# Patient Record
Sex: Male | Born: 1950
Health system: Southern US, Community
[De-identification: ages and names within clinical notes are randomized; demographics above are authoritative.]

## PROBLEM LIST (undated history)

## (undated) DIAGNOSIS — E785 Hyperlipidemia, unspecified: Secondary | ICD-10-CM

## (undated) DIAGNOSIS — N189 Chronic kidney disease, unspecified: Secondary | ICD-10-CM

## (undated) DIAGNOSIS — I495 Sick sinus syndrome: Secondary | ICD-10-CM

## (undated) DIAGNOSIS — G8929 Other chronic pain: Secondary | ICD-10-CM

## (undated) DIAGNOSIS — C61 Malignant neoplasm of prostate: Secondary | ICD-10-CM

## (undated) DIAGNOSIS — H409 Unspecified glaucoma: Secondary | ICD-10-CM

## (undated) DIAGNOSIS — R739 Hyperglycemia, unspecified: Secondary | ICD-10-CM

## (undated) DIAGNOSIS — E119 Type 2 diabetes mellitus without complications: Secondary | ICD-10-CM

## (undated) DIAGNOSIS — R079 Chest pain, unspecified: Secondary | ICD-10-CM

## (undated) DIAGNOSIS — K219 Gastro-esophageal reflux disease without esophagitis: Secondary | ICD-10-CM

## (undated) DIAGNOSIS — M549 Dorsalgia, unspecified: Secondary | ICD-10-CM

## (undated) DIAGNOSIS — N390 Urinary tract infection, site not specified: Secondary | ICD-10-CM

## (undated) DIAGNOSIS — F1910 Other psychoactive substance abuse, uncomplicated: Secondary | ICD-10-CM

## (undated) DIAGNOSIS — I10 Essential (primary) hypertension: Secondary | ICD-10-CM

## (undated) HISTORY — DX: Type 2 diabetes mellitus without complications: E11.9

## (undated) HISTORY — DX: Malignant neoplasm of prostate: C61

## (undated) HISTORY — DX: Chest pain, unspecified: R07.9

## (undated) HISTORY — DX: Hyperglycemia, unspecified: R73.9

## (undated) HISTORY — DX: Sick sinus syndrome: I49.5

## (undated) HISTORY — PX: CORONARY ANGIOPLASTY WITH STENT PLACEMENT: SHX49

## (undated) HISTORY — PX: PROSTATE BIOPSY: SHX241

## (undated) HISTORY — PX: CHOLECYSTECTOMY: SHX55

## (undated) HISTORY — DX: Unspecified glaucoma: H40.9

## (undated) HISTORY — DX: Other psychoactive substance abuse, uncomplicated: F19.10

---

## 1999-05-09 ENCOUNTER — Emergency Department (HOSPITAL_COMMUNITY): Admission: EM | Admit: 1999-05-09 | Discharge: 1999-05-09 | Payer: Self-pay | Admitting: Emergency Medicine

## 2000-12-04 ENCOUNTER — Inpatient Hospital Stay (HOSPITAL_COMMUNITY): Admission: EM | Admit: 2000-12-04 | Discharge: 2000-12-07 | Payer: Self-pay | Admitting: *Deleted

## 2000-12-04 ENCOUNTER — Encounter (INDEPENDENT_AMBULATORY_CARE_PROVIDER_SITE_OTHER): Payer: Self-pay | Admitting: Specialist

## 2000-12-05 ENCOUNTER — Encounter: Payer: Self-pay | Admitting: *Deleted

## 2006-03-27 ENCOUNTER — Encounter: Payer: Self-pay | Admitting: Emergency Medicine

## 2007-02-02 ENCOUNTER — Observation Stay (HOSPITAL_COMMUNITY): Admission: EM | Admit: 2007-02-02 | Discharge: 2007-02-05 | Payer: Self-pay | Admitting: Emergency Medicine

## 2007-02-03 ENCOUNTER — Encounter (INDEPENDENT_AMBULATORY_CARE_PROVIDER_SITE_OTHER): Payer: Self-pay | Admitting: Cardiology

## 2007-02-11 ENCOUNTER — Ambulatory Visit: Payer: Self-pay | Admitting: *Deleted

## 2007-02-18 ENCOUNTER — Ambulatory Visit: Payer: Self-pay | Admitting: Internal Medicine

## 2007-03-12 ENCOUNTER — Ambulatory Visit: Payer: Self-pay | Admitting: Internal Medicine

## 2007-03-21 ENCOUNTER — Ambulatory Visit: Payer: Self-pay | Admitting: Internal Medicine

## 2007-03-28 ENCOUNTER — Ambulatory Visit: Payer: Self-pay | Admitting: Internal Medicine

## 2007-04-19 ENCOUNTER — Encounter: Payer: Self-pay | Admitting: Internal Medicine

## 2007-04-19 DIAGNOSIS — F17200 Nicotine dependence, unspecified, uncomplicated: Secondary | ICD-10-CM | POA: Insufficient documentation

## 2007-04-19 DIAGNOSIS — I10 Essential (primary) hypertension: Secondary | ICD-10-CM | POA: Insufficient documentation

## 2007-04-19 DIAGNOSIS — F1910 Other psychoactive substance abuse, uncomplicated: Secondary | ICD-10-CM | POA: Insufficient documentation

## 2007-04-19 DIAGNOSIS — E782 Mixed hyperlipidemia: Secondary | ICD-10-CM | POA: Insufficient documentation

## 2007-04-19 HISTORY — DX: Other psychoactive substance abuse, uncomplicated: F19.10

## 2007-04-19 LAB — CONVERTED CEMR LAB
Cholesterol: 229 mg/dL
HDL: 39 mg/dL
LDL Cholesterol: 156 mg/dL
Triglycerides: 172 mg/dL

## 2007-11-28 ENCOUNTER — Encounter (INDEPENDENT_AMBULATORY_CARE_PROVIDER_SITE_OTHER): Payer: Self-pay | Admitting: Internal Medicine

## 2007-12-19 ENCOUNTER — Ambulatory Visit: Payer: Self-pay | Admitting: Internal Medicine

## 2007-12-19 DIAGNOSIS — H409 Unspecified glaucoma: Secondary | ICD-10-CM | POA: Insufficient documentation

## 2007-12-26 ENCOUNTER — Telehealth (INDEPENDENT_AMBULATORY_CARE_PROVIDER_SITE_OTHER): Payer: Self-pay | Admitting: Internal Medicine

## 2008-01-09 ENCOUNTER — Telehealth (INDEPENDENT_AMBULATORY_CARE_PROVIDER_SITE_OTHER): Payer: Self-pay | Admitting: Internal Medicine

## 2008-02-05 ENCOUNTER — Ambulatory Visit: Payer: Self-pay | Admitting: Internal Medicine

## 2008-02-05 LAB — CONVERTED CEMR LAB
Blood in Urine, dipstick: NEGATIVE
Glucose, Urine, Semiquant: NEGATIVE
Nitrite: NEGATIVE
Protein, U semiquant: 100
Specific Gravity, Urine: >=1.03
Urobilinogen, UA: NEGATIVE
WBC Urine, dipstick: NEGATIVE
pH: 5

## 2008-02-12 ENCOUNTER — Ambulatory Visit: Payer: Self-pay | Admitting: Internal Medicine

## 2008-02-12 LAB — CONVERTED CEMR LAB
Blood in Urine, dipstick: NEGATIVE
Glucose, Urine, Semiquant: NEGATIVE
Nitrite: NEGATIVE
Protein, U semiquant: 30
Specific Gravity, Urine: >=1.03
Urobilinogen, UA: 0.2
WBC Urine, dipstick: NEGATIVE
pH: 5

## 2008-02-14 ENCOUNTER — Encounter (INDEPENDENT_AMBULATORY_CARE_PROVIDER_SITE_OTHER): Payer: Self-pay | Admitting: Internal Medicine

## 2008-02-14 LAB — CONVERTED CEMR LAB
ALT: 19 units/L (ref 0–53)
AST: 18 units/L (ref 0–37)
Albumin: 4.4 g/dL (ref 3.5–5.2)
Alkaline Phosphatase: 80 units/L (ref 39–117)
BUN: 10 mg/dL (ref 6–23)
Basophils Absolute: 0.1 10*3/uL (ref 0.0–0.1)
Basophils Relative: 2 % — ABNORMAL HIGH (ref 0–1)
CO2: 24 meq/L (ref 19–32)
Calcium: 9.6 mg/dL (ref 8.4–10.5)
Chloride: 101 meq/L (ref 96–112)
Cholesterol: 217 mg/dL — ABNORMAL HIGH (ref 0–200)
Creatinine, Ser: 1.94 mg/dL — ABNORMAL HIGH (ref 0.40–1.50)
Eosinophils Absolute: 0.2 10*3/uL (ref 0.0–0.7)
Eosinophils Relative: 4 % (ref 0–5)
Glucose, Bld: 117 mg/dL — ABNORMAL HIGH (ref 70–99)
HCT: 45.5 % (ref 39.0–52.0)
HDL: 45 mg/dL (ref >39)
Hemoglobin: 15.3 g/dL (ref 13.0–17.0)
LDL Cholesterol: 130 mg/dL — ABNORMAL HIGH (ref 0–99)
Lymphocytes Relative: 32 % (ref 12–46)
Lymphs Abs: 1.3 10*3/uL (ref 0.7–4.0)
MCHC: 33.6 g/dL (ref 30.0–36.0)
MCV: 87 fL (ref 78.0–100.0)
Monocytes Absolute: 0.4 10*3/uL (ref 0.1–1.0)
Monocytes Relative: 9 % (ref 3–12)
Neutro Abs: 2.1 10*3/uL (ref 1.7–7.7)
Neutrophils Relative %: 53 % (ref 43–77)
PSA: 3.7 ng/mL (ref 0.10–4.00)
Platelets: 254 10*3/uL (ref 150–400)
Potassium: 4.2 meq/L (ref 3.5–5.3)
RBC: 5.23 M/uL (ref 4.22–5.81)
RDW: 13.4 % (ref 11.5–15.5)
Sodium: 137 meq/L (ref 135–145)
Total Bilirubin: 0.7 mg/dL (ref 0.3–1.2)
Total CHOL/HDL Ratio: 4.8
Total Protein: 7.5 g/dL (ref 6.0–8.3)
Triglycerides: 212 mg/dL — ABNORMAL HIGH (ref <150)
VLDL: 42 mg/dL — ABNORMAL HIGH (ref 0–40)
WBC: 4 10*3/uL (ref 4.0–10.5)

## 2008-06-08 ENCOUNTER — Ambulatory Visit: Payer: Self-pay | Admitting: Internal Medicine

## 2008-06-08 DIAGNOSIS — E119 Type 2 diabetes mellitus without complications: Secondary | ICD-10-CM

## 2008-06-08 DIAGNOSIS — R739 Hyperglycemia, unspecified: Secondary | ICD-10-CM | POA: Insufficient documentation

## 2008-06-08 LAB — CONVERTED CEMR LAB: Blood Glucose, Fingerstick: 107

## 2008-06-11 ENCOUNTER — Ambulatory Visit: Payer: Self-pay | Admitting: Internal Medicine

## 2008-06-11 LAB — CONVERTED CEMR LAB: Blood Glucose, AC Bkfst: 104 mg/dL

## 2008-06-24 ENCOUNTER — Encounter (INDEPENDENT_AMBULATORY_CARE_PROVIDER_SITE_OTHER): Payer: Self-pay | Admitting: Internal Medicine

## 2008-07-13 ENCOUNTER — Ambulatory Visit: Payer: Self-pay | Admitting: Internal Medicine

## 2008-07-21 ENCOUNTER — Ambulatory Visit: Payer: Self-pay | Admitting: Internal Medicine

## 2008-07-21 LAB — CONVERTED CEMR LAB
ALT: 26 units/L (ref 0–53)
AST: 20 units/L (ref 0–37)
Albumin: 4.5 g/dL (ref 3.5–5.2)
Alkaline Phosphatase: 75 units/L (ref 39–117)
Bilirubin, Direct: 0.2 mg/dL (ref 0.0–0.3)
Cholesterol: 138 mg/dL (ref 0–200)
HDL: 41 mg/dL (ref >39)
Indirect Bilirubin: 0.7 mg/dL (ref 0.0–0.9)
LDL Cholesterol: 70 mg/dL (ref 0–99)
Total Bilirubin: 0.9 mg/dL (ref 0.3–1.2)
Total CHOL/HDL Ratio: 3.4
Total Protein: 7.4 g/dL (ref 6.0–8.3)
Triglycerides: 135 mg/dL (ref <150)
VLDL: 27 mg/dL (ref 0–40)

## 2008-07-22 ENCOUNTER — Encounter (INDEPENDENT_AMBULATORY_CARE_PROVIDER_SITE_OTHER): Payer: Self-pay | Admitting: Internal Medicine

## 2009-08-04 ENCOUNTER — Ambulatory Visit: Payer: Self-pay | Admitting: Internal Medicine

## 2009-08-10 ENCOUNTER — Ambulatory Visit: Payer: Self-pay | Admitting: Internal Medicine

## 2009-08-22 LAB — CONVERTED CEMR LAB
ALT: 34 units/L (ref 0–53)
AST: 27 units/L (ref 0–37)
Albumin: 4.5 g/dL (ref 3.5–5.2)
Alkaline Phosphatase: 79 units/L (ref 39–117)
BUN: 14 mg/dL (ref 6–23)
Basophils Absolute: 0 10*3/uL (ref 0.0–0.1)
Basophils Relative: 1 % (ref 0–1)
CO2: 23 meq/L (ref 19–32)
Calcium: 9.5 mg/dL (ref 8.4–10.5)
Chloride: 103 meq/L (ref 96–112)
Cholesterol: 165 mg/dL (ref 0–200)
Creatinine, Ser: 1.79 mg/dL — ABNORMAL HIGH (ref 0.40–1.50)
Eosinophils Absolute: 0.1 10*3/uL (ref 0.0–0.7)
Eosinophils Relative: 3 % (ref 0–5)
Glucose, Bld: 102 mg/dL — ABNORMAL HIGH (ref 70–99)
HCT: 47.1 % (ref 39.0–52.0)
HDL: 39 mg/dL — ABNORMAL LOW (ref >39)
Hemoglobin: 15.8 g/dL (ref 13.0–17.0)
LDL Cholesterol: 93 mg/dL (ref 0–99)
Lymphocytes Relative: 35 % (ref 12–46)
Lymphs Abs: 1.4 10*3/uL (ref 0.7–4.0)
MCHC: 33.5 g/dL (ref 30.0–36.0)
MCV: 87.4 fL (ref 78.0–100.0)
Monocytes Absolute: 0.4 10*3/uL (ref 0.1–1.0)
Monocytes Relative: 11 % (ref 3–12)
Neutro Abs: 2 10*3/uL (ref 1.7–7.7)
Neutrophils Relative %: 50 % (ref 43–77)
Platelets: 226 10*3/uL (ref 150–400)
Potassium: 4.3 meq/L (ref 3.5–5.3)
RBC: 5.39 M/uL (ref 4.22–5.81)
RDW: 14.4 % (ref 11.5–15.5)
Sodium: 138 meq/L (ref 135–145)
Total Bilirubin: 1 mg/dL (ref 0.3–1.2)
Total CHOL/HDL Ratio: 4.2
Total Protein: 7.4 g/dL (ref 6.0–8.3)
Triglycerides: 167 mg/dL — ABNORMAL HIGH (ref <150)
VLDL: 33 mg/dL (ref 0–40)
WBC: 4 10*3/uL (ref 4.0–10.5)

## 2009-08-31 ENCOUNTER — Ambulatory Visit: Payer: Self-pay | Admitting: Internal Medicine

## 2009-09-20 ENCOUNTER — Ambulatory Visit: Payer: Self-pay | Admitting: Internal Medicine

## 2009-09-21 ENCOUNTER — Encounter (INDEPENDENT_AMBULATORY_CARE_PROVIDER_SITE_OTHER): Payer: Self-pay | Admitting: Internal Medicine

## 2009-10-03 ENCOUNTER — Telehealth (INDEPENDENT_AMBULATORY_CARE_PROVIDER_SITE_OTHER): Payer: Self-pay | Admitting: Internal Medicine

## 2010-01-19 ENCOUNTER — Ambulatory Visit: Payer: Self-pay | Admitting: Internal Medicine

## 2010-01-19 DIAGNOSIS — K219 Gastro-esophageal reflux disease without esophagitis: Secondary | ICD-10-CM | POA: Insufficient documentation

## 2010-01-28 ENCOUNTER — Encounter (INDEPENDENT_AMBULATORY_CARE_PROVIDER_SITE_OTHER): Payer: Self-pay | Admitting: Internal Medicine

## 2010-01-28 LAB — CONVERTED CEMR LAB
ALT: 22 units/L (ref 0–53)
AST: 19 units/L (ref 0–37)
Albumin: 4.3 g/dL (ref 3.5–5.2)
Alkaline Phosphatase: 89 units/L (ref 39–117)
BUN: 10 mg/dL (ref 6–23)
Basophils Absolute: 0.1 10*3/uL (ref 0.0–0.1)
Basophils Relative: 1 % (ref 0–1)
CO2: 26 meq/L (ref 19–32)
Calcium: 9.3 mg/dL (ref 8.4–10.5)
Chloride: 103 meq/L (ref 96–112)
Cholesterol: 123 mg/dL (ref 0–200)
Creatinine, Ser: 1.81 mg/dL — ABNORMAL HIGH (ref 0.40–1.50)
Eosinophils Absolute: 0.2 10*3/uL (ref 0.0–0.7)
Eosinophils Relative: 5 % (ref 0–5)
Glucose, Bld: 100 mg/dL — ABNORMAL HIGH (ref 70–99)
HCT: 43.9 % (ref 39.0–52.0)
HDL: 41 mg/dL (ref >39)
Hemoglobin: 14.6 g/dL (ref 13.0–17.0)
Hgb A1c MFr Bld: 6.3 % — ABNORMAL HIGH (ref 4.6–6.1)
LDL Cholesterol: 55 mg/dL (ref 0–99)
Lymphocytes Relative: 36 % (ref 12–46)
Lymphs Abs: 1.4 10*3/uL (ref 0.7–4.0)
MCHC: 33.3 g/dL (ref 30.0–36.0)
MCV: 87.8 fL (ref 78.0–100.0)
Monocytes Absolute: 0.5 10*3/uL (ref 0.1–1.0)
Monocytes Relative: 12 % (ref 3–12)
Neutro Abs: 1.8 10*3/uL (ref 1.7–7.7)
Neutrophils Relative %: 46 % (ref 43–77)
Platelets: 230 10*3/uL (ref 150–400)
Potassium: 4.7 meq/L (ref 3.5–5.3)
RBC: 5 M/uL (ref 4.22–5.81)
RDW: 14.2 % (ref 11.5–15.5)
Sodium: 138 meq/L (ref 135–145)
Total Bilirubin: 0.9 mg/dL (ref 0.3–1.2)
Total CHOL/HDL Ratio: 3
Total Protein: 7.1 g/dL (ref 6.0–8.3)
Triglycerides: 134 mg/dL (ref <150)
VLDL: 27 mg/dL (ref 0–40)
WBC: 3.9 10*3/uL — ABNORMAL LOW (ref 4.0–10.5)

## 2010-03-06 ENCOUNTER — Encounter (INDEPENDENT_AMBULATORY_CARE_PROVIDER_SITE_OTHER): Payer: Self-pay | Admitting: Internal Medicine

## 2010-03-21 ENCOUNTER — Ambulatory Visit: Payer: Self-pay | Admitting: Internal Medicine

## 2010-03-21 LAB — CONVERTED CEMR LAB: Blood Glucose, Fingerstick: 134

## 2010-03-22 ENCOUNTER — Ambulatory Visit: Payer: Self-pay | Admitting: Internal Medicine

## 2010-03-24 ENCOUNTER — Encounter: Admission: RE | Admit: 2010-03-24 | Discharge: 2010-03-24 | Payer: Self-pay | Admitting: Internal Medicine

## 2010-04-10 ENCOUNTER — Encounter (INDEPENDENT_AMBULATORY_CARE_PROVIDER_SITE_OTHER): Payer: Self-pay | Admitting: Internal Medicine

## 2010-04-14 ENCOUNTER — Encounter (INDEPENDENT_AMBULATORY_CARE_PROVIDER_SITE_OTHER): Payer: Self-pay | Admitting: Internal Medicine

## 2010-05-17 ENCOUNTER — Ambulatory Visit: Payer: Self-pay | Admitting: Internal Medicine

## 2010-05-17 ENCOUNTER — Telehealth (INDEPENDENT_AMBULATORY_CARE_PROVIDER_SITE_OTHER): Payer: Self-pay | Admitting: Internal Medicine

## 2010-05-17 DIAGNOSIS — I495 Sick sinus syndrome: Secondary | ICD-10-CM | POA: Insufficient documentation

## 2010-05-17 DIAGNOSIS — R079 Chest pain, unspecified: Secondary | ICD-10-CM | POA: Insufficient documentation

## 2010-05-17 DIAGNOSIS — I2510 Atherosclerotic heart disease of native coronary artery without angina pectoris: Secondary | ICD-10-CM | POA: Insufficient documentation

## 2010-05-17 HISTORY — DX: Sick sinus syndrome: I49.5

## 2010-05-23 ENCOUNTER — Telehealth (INDEPENDENT_AMBULATORY_CARE_PROVIDER_SITE_OTHER): Payer: Self-pay | Admitting: Internal Medicine

## 2010-05-23 ENCOUNTER — Encounter: Payer: Self-pay | Admitting: Cardiovascular Disease

## 2010-05-23 ENCOUNTER — Ambulatory Visit (HOSPITAL_COMMUNITY): Admission: RE | Admit: 2010-05-23 | Discharge: 2010-05-23 | Payer: Self-pay | Admitting: Internal Medicine

## 2010-05-25 ENCOUNTER — Encounter (INDEPENDENT_AMBULATORY_CARE_PROVIDER_SITE_OTHER): Payer: Self-pay | Admitting: Internal Medicine

## 2010-06-22 ENCOUNTER — Ambulatory Visit: Payer: Self-pay | Admitting: Cardiovascular Disease

## 2010-06-27 ENCOUNTER — Telehealth (INDEPENDENT_AMBULATORY_CARE_PROVIDER_SITE_OTHER): Payer: Self-pay | Admitting: *Deleted

## 2010-06-28 ENCOUNTER — Ambulatory Visit: Payer: Self-pay

## 2010-06-28 ENCOUNTER — Encounter (HOSPITAL_COMMUNITY): Admission: RE | Admit: 2010-06-28 | Discharge: 2010-08-15 | Payer: Self-pay | Admitting: Cardiovascular Disease

## 2010-06-28 ENCOUNTER — Encounter: Payer: Self-pay | Admitting: Cardiovascular Disease

## 2010-06-28 ENCOUNTER — Ambulatory Visit: Payer: Self-pay | Admitting: Cardiovascular Disease

## 2010-09-15 ENCOUNTER — Ambulatory Visit: Payer: Self-pay | Admitting: Internal Medicine

## 2010-09-15 ENCOUNTER — Telehealth (INDEPENDENT_AMBULATORY_CARE_PROVIDER_SITE_OTHER): Payer: Self-pay | Admitting: Internal Medicine

## 2010-09-15 LAB — CONVERTED CEMR LAB
ALT: 25 units/L (ref 0–53)
AST: 20 units/L (ref 0–37)
Albumin: 4.2 g/dL (ref 3.5–5.2)
Alkaline Phosphatase: 80 units/L (ref 39–117)
BUN: 14 mg/dL (ref 6–23)
Basophils Absolute: 0.1 10*3/uL (ref 0.0–0.1)
Basophils Relative: 1 % (ref 0–1)
Bilirubin Urine: NEGATIVE
CO2: 22 meq/L (ref 19–32)
Calcium: 9.5 mg/dL (ref 8.4–10.5)
Chlamydia, Swab/Urine, PCR: NEGATIVE
Chloride: 104 meq/L (ref 96–112)
Cholesterol: 115 mg/dL (ref 0–200)
Creatinine, Ser: 1.81 mg/dL — ABNORMAL HIGH (ref 0.40–1.50)
Eosinophils Absolute: 0.1 10*3/uL (ref 0.0–0.7)
Eosinophils Relative: 3 % (ref 0–5)
GC Probe Amp, Urine: NEGATIVE
Glucose, Bld: 94 mg/dL (ref 70–99)
Glucose, Urine, Semiquant: NEGATIVE
HCT: 44 % (ref 39.0–52.0)
HDL: 30 mg/dL — ABNORMAL LOW (ref >39)
Hemoglobin: 14.5 g/dL (ref 13.0–17.0)
LDL Cholesterol: 50 mg/dL (ref 0–99)
Lymphocytes Relative: 35 % (ref 12–46)
Lymphs Abs: 1.4 10*3/uL (ref 0.7–4.0)
MCHC: 33 g/dL (ref 30.0–36.0)
MCV: 87.8 fL (ref 78.0–100.0)
Monocytes Absolute: 0.5 10*3/uL (ref 0.1–1.0)
Monocytes Relative: 13 % — ABNORMAL HIGH (ref 3–12)
Neutro Abs: 1.8 10*3/uL (ref 1.7–7.7)
Neutrophils Relative %: 47 % (ref 43–77)
Nitrite: NEGATIVE
Platelets: 248 10*3/uL (ref 150–400)
Potassium: 4.7 meq/L (ref 3.5–5.3)
Protein, U semiquant: NEGATIVE
RBC: 5.01 M/uL (ref 4.22–5.81)
RDW: 14 % (ref 11.5–15.5)
Sodium: 137 meq/L (ref 135–145)
Specific Gravity, Urine: >=1.03
Total Bilirubin: 0.9 mg/dL (ref 0.3–1.2)
Total CHOL/HDL Ratio: 3.8
Total Protein: 6.9 g/dL (ref 6.0–8.3)
Triglycerides: 173 mg/dL — ABNORMAL HIGH (ref <150)
Urobilinogen, UA: 0.2
VLDL: 35 mg/dL (ref 0–40)
WBC Urine, dipstick: NEGATIVE
WBC: 3.9 10*3/uL — ABNORMAL LOW (ref 4.0–10.5)
pH: 5

## 2010-09-20 ENCOUNTER — Encounter (INDEPENDENT_AMBULATORY_CARE_PROVIDER_SITE_OTHER): Payer: Self-pay | Admitting: Internal Medicine

## 2010-09-28 ENCOUNTER — Encounter (INDEPENDENT_AMBULATORY_CARE_PROVIDER_SITE_OTHER): Payer: Self-pay | Admitting: *Deleted

## 2010-09-30 ENCOUNTER — Encounter (INDEPENDENT_AMBULATORY_CARE_PROVIDER_SITE_OTHER): Payer: Self-pay | Admitting: Internal Medicine

## 2010-10-06 ENCOUNTER — Ambulatory Visit: Payer: Self-pay | Admitting: Gastroenterology

## 2010-10-16 ENCOUNTER — Ambulatory Visit: Payer: Self-pay | Admitting: Gastroenterology

## 2010-10-22 ENCOUNTER — Telehealth (INDEPENDENT_AMBULATORY_CARE_PROVIDER_SITE_OTHER): Payer: Self-pay | Admitting: Internal Medicine

## 2010-10-22 ENCOUNTER — Encounter (INDEPENDENT_AMBULATORY_CARE_PROVIDER_SITE_OTHER): Payer: Self-pay | Admitting: Internal Medicine

## 2010-12-26 NOTE — Letter (Signed)
Summary: Pre Visit Letter Revised  [facility]  [address] [place], [place] [address]   Phone: [phone]  Fax: [phone]        09/28/2010 MRN: [id number] [facility] [address] [place], [place]  [address]             Procedure Date:  10/16/2010   Welcome to the Gastroenterology Division at [facility].    You are scheduled to see a nurse for your pre-procedure visit on 10/06/2010 at 10:30AM on the 3rd floor at [facility], [address]  We ask that you try to arrive at our office 15 minutes prior to your appointment time to allow for check-in.  Please take a minute to review the attached form.  If you answer "Yes" to one or more of the questions on the first page, we ask that you call the person listed at your earliest opportunity.  If you answer "No" to all of the questions, please complete the rest of the form and bring it to your appointment.    Your nurse visit will consist of discussing your medical and surgical history, your immediate family medical history, and your medications.   If you are unable to list all of your medications on the form, please bring the medication bottles to your appointment and we will list them.  We will need to be aware of both prescribed and over the counter drugs.  We will need to know exact dosage information as well.    Please be prepared to read and sign documents such as consent forms, a financial agreement, and acknowledgement forms.  If necessary, and with your consent, a friend or relative is welcome to sit-in on the nurse visit with you.  Please bring your insurance card so that we may make a copy of it.  If your insurance requires a referral to see a specialist, please bring your referral form from your primary care physician.  No co-pay is required for this nurse visit.     If you cannot keep your appointment, please call [phone] to cancel or reschedule prior to your appointment date.   This allows [place] the opportunity to schedule an appointment for another patient in need of care.    Thank you for choosing [facility] for your medical needs.  We appreciate the opportunity to care for you.  Please visit [place] at our website  to learn more about our practice.  Sincerely, The Gastroenterology Division

## 2010-12-26 NOTE — Letter (Signed)
Summary: Lipid Letter  [facility]  [address] [place], [place] [address]   Phone: [phone]  Fax: [phone]    09/30/2010  Edward Stafford [address] [place], [place]  [address]  Dear Edward Stafford:  We have carefully reviewed your last lipid profile from 09/15/2010 and the results are noted below with a summary of recommendations for lipid management.    Cholesterol:       115     Goal: <200   HDL "good" Cholesterol:   30     Goal: >45   LDL "bad" Cholesterol:   50     Goal: <70   Triglycerides:       173     Goal: <150    Your cholesterol is okay, though would like to see your HDL up a bit and your triglycerides down a bit.  Rest of labs were fine as well.  Work on regular exercise to improve your cholesterol.    TLC Diet (Therapeutic Lifestyle Change): Saturated Fats & Transfatty acids should be kept < 7% of total calories ***Reduce Saturated Fats Polyunstaurated Fat can be up to 10% of total calories Monounsaturated Fat Fat can be up to 20% of total calories Total Fat should be no greater than 25-35% of total calories Carbohydrates should be 50-60% of total calories Protein should be approximately 15% of total calories Fiber should be at least 20-30 grams a day ***Increased fiber may help lower LDL Total Cholesterol should be < 200mg /day Consider adding plant stanol/sterols to diet (example: Benacol spread) ***A higher intake of unsaturated fat may reduce Triglycerides and Increase HDL    Adjunctive Measures (may lower LIPIDS and reduce risk of Heart Attack) include: Aerobic Exercise (20-30 minutes 3-4 times a week) Limit Alcohol Consumption Weight Reduction Aspirin 75-81 mg a day by mouth (if not allergic or contraindicated) Dietary Fiber 20-30 grams a day by mouth     Current Medications: 1)    Labetalol Hcl 200 Mg Tabs (Labetalol hcl) .Marland Kitchen.. 1 by mouth two times a day 2)    Clonidine Hcl 0.2 Mg Tabs (Clonidine hcl)  .... Take one tablet by mouth twice a day 3)    Lisinopril 10 Mg Tabs (Lisinopril) .Marland Kitchen.. 1 by mouth once daily 4)    Lipitor 20 Mg Tabs (Atorvastatin calcium) .Marland Kitchen.. 1 tab by mouth daily 5)    Aspirin Ec 81 Mg Tbec (Aspirin) .Marland Kitchen.. 1 tab by mouth daily with a meal 6)    Famotidine 20 Mg Tabs (Famotidine) .... 2 tabs by mouth at bedtime 7)    Nitrostat 0.4 Mg Subl (Nitroglycerin) .Marland Kitchen.. 1 tab sublingual as needed chest pain.  may repeat every 5 minutes x2 if chest pain not relieved  If you have any questions, please call. We appreciate being able to work with you.   Sincerely,    [facility] Julieanne Manson MD

## 2010-12-26 NOTE — Letter (Signed)
Summary: [facility]  [facility]   Imported By: Arta Bruce 11/07/2009 12:29:15  _____________________________________________________________________  External Attachment:    Type:   Image     Comment:   External Document

## 2010-12-26 NOTE — Progress Notes (Signed)
Summary: Cardiology referral  Phone Note Outgoing Call   Summary of Call: Debra--see cardiology referral order and letter.  Dr. Eldridge Dace, Deboraha Sprang saw him in hospital in 2008, but do not know if he would be willing to see again for lower cost Initial call taken by: Julieanne Manson MD,  May 17, 2010 10:25 AM

## 2010-12-26 NOTE — Procedures (Signed)
Summary: Colonoscopy  Patient: Edward Stafford Note: All result statuses are Final unless otherwise noted.  Tests: (1) Colonoscopy (COL)   COL Colonoscopy           DONE     [facility]     [address]     [place], [place]  [address]           COLONOSCOPY PROCEDURE REPORT           PATIENT:  Edward Stafford, Edward Stafford  MR#:  [id number]     BIRTHDATE:  October 20, 1951, 59 yrs. old  GENDER:  male     ENDOSCOPIST:  Vania Rea. Jarold Motto, MD, [facility]     REF. BY:  Julieanne Manson, M.D.     PROCEDURE DATE:  10/16/2010     PROCEDURE:  Average-risk screening colonoscopy     G0121     ASA CLASS:  Class II     INDICATIONS:  Routine Risk Screening     MEDICATIONS:   Fentanyl 50 mcg IV, Versed 5 mg IV           DESCRIPTION OF PROCEDURE:   After the risks benefits and     alternatives of the procedure were thoroughly explained, informed     consent was obtained.  Digital rectal exam was performed and     revealed no abnormalities.   The [id number] endoscope was     introduced through the anus and advanced to the cecum, which was     identified by both the appendix and ileocecal valve, without     limitations.  The quality of the prep was adequate, using     MoviPrep.  The instrument was then slowly withdrawn as the colon     was fully examined.     <<PROCEDUREIMAGES>>           FINDINGS:  No polyps or cancers were seen.  This was otherwise a     normal examination of the colon.   Retroflexed views in the rectum     revealed no abnormalities.    The scope was then withdrawn from     the patient and the procedure completed.           COMPLICATIONS:  None     ENDOSCOPIC IMPRESSION:     1) No polyps or cancers     2) Otherwise normal examination     RECOMMENDATIONS:     1) Continue current colorectal screening recommendations for     "routine risk" patients with a repeat colonoscopy in 10 years.     REPEAT EXAM:  No           ______________________________     Vania Rea. Jarold Motto,  MD, [facility]           CC:           n.     eSIGNED:   Vania Rea. Patterson at 10/16/2010 10:29 AM           Jessica Priest, [id number]  Note: An exclamation mark (!) indicates a result that was not dispersed into the flowsheet. Document Creation Date: 10/16/2010 10:29 AM _______________________________________________________________________  (1) Order result status: Final Collection or observation date-time: 10/16/2010 10:24 Requested date-time:  Receipt date-time:  Reported date-time:  Referring Physician:   Ordering Physician: Sheryn Bison [phone]) Specimen Source:  Source: Launa Grill Order Number: [id number] Lab site:   Appended Document: Colonoscopy    Clinical Lists Changes  Observations: Added new observation of COLONNXTDUE: 09/2020 (  10/16/2010 11:49)     

## 2010-12-26 NOTE — Progress Notes (Signed)
Summary: eye referral  Phone Note Outgoing Call   Summary of Call: Nora--I cannot find any records from [facility], but pt. gave a history recently that he was sent in that direction, but could not tell me what the plan was or if he followed up correctly--not sure if this was through [facility] or not.  He has glaucoma.  Can you look into this for me? Initial call taken by: Julieanne Manson MD,  October 22, 2010 5:15 PM  Follow-up for Phone Call        I call [facility] I talk to Neoma Laming and she told me to fsxed the information again and she will call me and let me know  I call [facility] and they told me that the pt show to his appts on 2009 and I'm going to request his records . Follow-up by: Cheryll Dessert,  October 24, 2010 8:45 AM

## 2010-12-26 NOTE — Assessment & Plan Note (Signed)
Summary: HAS GLUCOMA///KT   Vital Signs:  Patient Profile:   60 Years Old Male Weight:      238 pounds Temp:     98.3 degrees F Pulse rate:   64 / minute Pulse rhythm:   regular Resp:     16 per minute BP sitting:   124 / 84  (left arm) Cuff size:   large  Pt. in pain?   no  Vitals Entered By: Vesta Mixer CMA (December 19, 2007 11:57 AM)              Is Patient Diabetic? No  Does patient need assistance? Ambulation Normal     Chief Complaint:  needs referrral to eye doctor for glaucoma.  History of Present Illness: 1.  ?Glaucoma:  Pt. states he went through a free screening about a year ago and was told he has glaucoma.  He is not clear who the screening was through.  States he was told by a [facility] representative that he should come here for an eye referral.  No halos, no visual changes in past year.  Current Allergies: No known allergies       Physical Exam  Eyes:     pupils equal, pupils round, and pupils reactive to light.  difficult to see discs secondary to small pupils.    Impression & Recommendations:  Problem # 1:  GLAUCOMA (ICD-365.9) [facility] does not treat glaucoma. Call into [facility] as to whether any ophtho access--not aware of any myself. Orders: Ophthalmology Referral (Ophthalmology)   Complete Medication List: 1)  Labetalol Hcl 200 Mg Tabs (Labetalol hcl) .Marland Kitchen.. 1 by mouth two times a day 2)  Clonidine Hcl 0.1 Mg Tabs (Clonidine hcl) .Marland Kitchen.. 1 by mouth two times a day 3)  Lisinopril 10 Mg Tabs (Lisinopril) .Marland Kitchen.. 1 by mouth once daily   Patient Instructions: 1)  Make appt for CPE    ]

## 2010-12-26 NOTE — Assessment & Plan Note (Signed)
Summary: followup with Dr Delrae Alfred in 4 months///gk   Vital Signs:  Patient profile:   60 year old male Weight:      268 pounds Temp:     98.3 degrees F Pulse rate:   49 / minute Pulse rhythm:   regular Resp:     18 per minute BP sitting:   155 / 93  (left arm) Cuff size:   large  Vitals Entered By: Vesta Mixer CMA (January 19, 2010 8:35 AM) CC: 4 month f/u, pt is fasting. Is Patient Diabetic? No Pain Assessment Patient in pain? no       Does patient need assistance? Ambulation Normal   CC:  4 month f/u and pt is fasting.Marland Kitchen  History of Present Illness: 1.  Hyperlipidemia:  pt. did not come in for fasting lipids before OV as ordered--did not recall the appt.  Is taking Lipitor 20 mg now.  Denies missing any meds.  2.  Hypertension:  states not missing meds.  Has gained 8 lbs.  Not exercising.  3.  CKD:  Energy is good.  Good appetite.  Creatinine stable in fall.  No dyspnea, no lower extremity edema.  4.  Hyperglycemia:  states came here to see Nutrition--[name] Piper.  Missed appt at [facility].  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  Has not made any changes.  Drinks a lot of sodas--Coke--drinks a liter daily.    5.  Acid Reflux:  Bubble coming up in throat.  Better if burps or if uses Rolaids.  Gets acid taste in mouth.  Has had for years.  Never been on anything other than otc meds--Rolaids.  Has tried Tagamet--helps.  Non smoker.  Little ETOH.  Does drink Coke a lot.  Eats a lot of chocolate.  Little in way of onions and tomatoes.      Current Medications (verified): 1)  Labetalol Hcl 200 Mg Tabs (Labetalol Hcl) .Marland Kitchen.. 1 By Mouth Two Times A Day 2)  Clonidine Hcl 0.1 Mg Tabs (Clonidine Hcl) .... 2 Tab By Mouth Two Times A Day 3)  Lisinopril 10 Mg Tabs (Lisinopril) .Marland Kitchen.. 1 By Mouth Once Daily 4)  Lipitor 20 Mg Tabs (Atorvastatin Calcium) .Marland Kitchen.. 1 Tab By Mouth Daily  Allergies (verified): No Known  Drug Allergies  Physical Exam  General:  overweight, NAD Mouth:  poor dentition and teeth missing.   Lungs:  Normal respiratory effort, chest expands symmetrically. Lungs are clear to auscultation, no crackles or wheezes. Heart:  Normal rate and regular rhythm. S1 and S2 normal without gallop, murmur, click, rub or other extra sounds.  Radial pulses normal and equal Abdomen:  Bowel sounds positive,abdomen soft and non-tender without masses, organomegaly or hernias noted.   Impression & Recommendations:  Problem # 1:  HYPERGLYCEMIA (ICD-790.29) To work on lifestyle after long discussion -- both with diet and exercise Orders: Hgb A1C (04540JW)  Problem # 2:  DISEASE, HTN CKD, NOS, W/CKD, STG I-IV/UNSPC (ICD-403.90) As above His updated medication list for this problem includes:    Labetalol Hcl 200 Mg Tabs (Labetalol hcl) .Marland Kitchen... 1 by mouth two times a day    Clonidine Hcl 0.1 Mg Tabs (Clonidine hcl) .Marland Kitchen... 2 tab by mouth two times a day    Lisinopril 10 Mg Tabs (Lisinopril) .Marland Kitchen... 1 by mouth once daily  Orders: T-Comprehensive Metabolic Panel ([address]) T-CBC w/Diff ([address])  Problem # 3:  HYPERLIPIDEMIA, MIXED (ICD-272.2)  His updated medication list for this problem includes:    Lipitor 20 Mg Tabs (Atorvastatin calcium) .Marland Kitchen... 1 tab by mouth daily  Orders: T-Lipid Profile ([address])  Problem # 4:  CORONARY ARTERY DISEASE (ICD-414.00) Needs to take a baby aspirin daily His updated medication list for this problem includes:    Labetalol Hcl 200 Mg Tabs (Labetalol hcl) .Marland Kitchen... 1 by mouth two times a day    Clonidine Hcl 0.1 Mg Tabs (Clonidine hcl) .Marland Kitchen... 2 tab by mouth two times a day    Lisinopril 10 Mg Tabs (Lisinopril) .Marland Kitchen... 1 by mouth once daily    Aspirin Ec 81 Mg Tbec (Aspirin) .Marland Kitchen... 1 tab by mouth daily with a meal  Problem # 5:  GERD (ICD-530.81)  His updated medication list for this problem includes:    Famotidine 40 Mg Tabs (Famotidine) .Marland Kitchen... 1 tab by mouth  at bedtime  Complete Medication List: 1)  Labetalol Hcl 200 Mg Tabs (Labetalol hcl) .Marland Kitchen.. 1 by mouth two times a day 2)  Clonidine Hcl 0.1 Mg Tabs (Clonidine hcl) .... 2 tab by mouth two times a day 3)  Lisinopril 10 Mg Tabs (Lisinopril) .Marland Kitchen.. 1 by mouth once daily 4)  Lipitor 20 Mg Tabs (Atorvastatin calcium) .Marland Kitchen.. 1 tab by mouth daily 5)  Aspirin Ec 81 Mg Tbec (Aspirin) .Marland Kitchen.. 1 tab by mouth daily with a meal 6)  Famotidine 40 Mg Tabs (Famotidine) .Marland Kitchen.. 1 tab by mouth at bedtime  Patient Instructions: 1)  Take an enteric coated baby aspirin every day 2)  Decrease chocolate, Coke or other caffeinated products, elevate the head of your bed and do not lie down for 2 hours after eating--sit upright 3)  nurse visit for bp check in 2 months 4)  Follow up with Dr. Delrae Alfred in 4 months --hypertension, hyperglycemia, GERD Prescriptions: FAMOTIDINE 40 MG TABS (FAMOTIDINE) 1 tab by mouth at bedtime  #30 x 6   Entered and Authorized by:   Julieanne Manson MD   Signed by:   Julieanne Manson MD on 01/19/2010   Method used:   Faxed to ...       [facility] (retail)       [address] [place], [place]  [address]       Ph: [phone]       Fax: [phone]   RxID:   [id number]

## 2010-12-26 NOTE — Letter (Signed)
Summary: [facility]: Referral Form   [facility]: Referral Form   Imported By: Earl Many 07/10/2010 15:37:28  _____________________________________________________________________  External Attachment:    Type:   Image     Comment:   External Document

## 2010-12-26 NOTE — Letter (Signed)
Summary: [facility]  [facility]   Imported By: Arta Bruce 04/18/2010 09:41:04  _____________________________________________________________________  External Attachment:    Type:   Image     Comment:   External Document

## 2010-12-26 NOTE — Letter (Signed)
Summary: NUTRITIONIST SUMMARY  NUTRITIONIST SUMMARY   Imported By: Arta Bruce 09/22/2008 11:44:22  _____________________________________________________________________  External Attachment:    Type:   Image     Comment:   External Document

## 2010-12-26 NOTE — Progress Notes (Signed)
Summary: Cardio update  Phone Note From Other Clinic   Caller: Referral Coordinator Summary of Call: Pt has been dismissed from [facility] [employer].Will send notes to [facility] Initial call taken by: Candi Leash,  May 23, 2010 11:23 AM  Follow-up for Phone Call        Thanks Follow-up by: Julieanne Manson MD,  May 25, 2010 3:32 PM

## 2010-12-26 NOTE — Assessment & Plan Note (Signed)
Summary: Cardiology Nuclear Testing  Nuclear Med Background Indications for Stress Test: Evaluation for Ischemia   History: Abnormal EKG, Cardioversion, Echo, Heart Catheterization, Myocardial Perfusion Study  History Comments: '08 MPS EF 52% [facility] anterior ischemia '08 Heart Cath N/O CAD '08 ECHO EF 65%  Symptoms: Chest Pain, Chest Pain with Exertion    Nuclear Pre-Procedure Cardiac Risk Factors: Family History - CAD, Hypertension, Lipids, NIDDM, PVD Caffeine/Decaff Intake: none NPO After: 6:30 PM Lungs: clear IV 0.9% NS with Angio Cath: 20g     IV Site: (R) AC IV Started by: Irean Hong RN Chest Size (in) 46-48     Height (in): 69 Weight (lb): 258 BMI: 38.24 Tech Comments: Patient took Labetolol at 6:30 AM per physician  Nuclear Med Study 1 or 2 day study:  1 day     Stress Test Type:  Stress Reading MD:  Charlton Haws, MD     Referring MD:  P.Nishan Resting Radionuclide:  Technetium 2m Tetrofosmin     Resting Radionuclide Dose:  11.0 mCi  Stress Radionuclide:  Technetium 68m Tetrofosmin     Stress Radionuclide Dose:  31.7 mCi   Stress Protocol Exercise Time (min):  4:31 min     Max HR:  142 bpm     Predicted Max HR:  162 bpm  Max Systolic BP: 206 mm Hg     Percent Max HR:  87.65 %     METS: 6.4 Rate Pressure Product:  [address]    Stress Test Technologist:  Milana Na EMT-P     Nuclear Technologist:  Domenic Polite CNMT  Rest Procedure  Myocardial perfusion imaging was performed at rest 45 minutes following the intravenous administration of Myoview Technetium 6m Tetrofosmin.  Stress Procedure  The patient exercised for 4:31. The patient stopped due to fatigue, sob, and denied any chest pain.  There were no significant ST-T wave changes and rare pvcs.  Myoview was injected at peak exercise and myocardial perfusion imaging was performed after a brief delay.  QPS Raw Data Images:  Normal; no motion artifact; normal heart/lung ratio. Stress Images:  NI: Uniform  and normal uptake of tracer in all myocardial segments. Rest Images:  Normal homogeneous uptake in all areas of the myocardium. Subtraction (SDS):  Normal Transient Ischemic Dilatation:  1.03  (Normal <1.22)  Lung/Heart Ratio:  .39  (Normal <0.45)  Quantitative Gated Spect Images QGS EDV:  120 ml QGS ESV:  53 ml QGS EF:  56 % QGS cine images:  normal  Findings Normal nuclear study      Overall Impression  Exercise Capacity: Poor exercise capacity. BP Response: Hypertensive blood pressure response. Clinical Symptoms: Dysnpnea ECG Impression: No significant ST segment change suggestive of ischemia. Overall Impression: Normal stress nuclear study.  Appended Document: Cardiology Nuclear Testing normal nuclear study  Appended Document: Cardiology Nuclear Testing pt aware of results

## 2010-12-26 NOTE — Letter (Signed)
Summary: *Referral Letter  [facility]  [address] [place], [place] [address]   Phone: [phone]  Fax: [phone]    05/17/2010  Thank you in advance for agreeing to see my patient:  [facility] [address] [place], [place]  [address]  Phone: [phone]  Reason for Referral: Exertional chest pain starting 3-4 months ago.  Resolves with rest.  Hx. of nonobstructive CAD on cardiac cath in 2008 during hospitalization for hypertensive emergency.  BP currently controlled and cholesterol treated and at goal.  Has developed borderline DM recently, currently diet controlled with A1C at 6.3%.  EKG with sinus bradycardia, asymptomatic, and nonspecific T wave changes.  CXR pending.  Procedures Requested: Evaluation for cardiac cause of exertional chest pain.  Current Medical Problems: 1)  CHEST PAIN (ICD-786.50) 2)  GERD (ICD-530.81) 3)  ENCOUNTER FOR LONG-TERM USE OF OTHER MEDICATIONS (ICD-V58.69) 4)  DIABETES MELLITUS, TYPE II, CONTROLLED (ICD-250.00) 5)  HEALTH MAINTENANCE EXAM (ICD-V70.0) 6)  GLAUCOMA (ICD-365.9) 7)  TOBACCO ABUSE (ICD-305.1) 8)  MARIJUANA ABUSE (ICD-305.20) 9)  DEPENDENCE, COCAINE, UNSPECIFIED (ICD-304.20) 10)  DISEASE, HTN CKD, NOS, W/CKD, STG I-IV/UNSPC (ICD-403.90) 11)  HYPERTENSION (ICD-401.9) 12)  HYPERLIPIDEMIA, MIXED (ICD-272.2) 13)  CORONARY ARTERY DISEASE (ICD-414.00)   Current Medications: 1)  LABETALOL HCL 200 MG TABS (LABETALOL HCL) 1 by mouth two times a day 2)  CLONIDINE HCL 0.1 MG TABS (CLONIDINE HCL) 2 tab by mouth two times a day 3)  LISINOPRIL 10 MG TABS (LISINOPRIL) 1 by mouth once daily 4)  LIPITOR 20 MG TABS (ATORVASTATIN CALCIUM) 1 tab by mouth daily 5)  ASPIRIN EC 81 MG TBEC (ASPIRIN) 1 tab by mouth daily with a meal 6)  FAMOTIDINE 40 MG TABS (FAMOTIDINE) 1 tab by mouth at bedtime 7)  NITROSTAT 0.4 MG SUBL (NITROGLYCERIN) 1 tab sublingual as needed chest pain.  May repeat every 5 minutes x2 if chest  pain not relieved   Past Medical History: 1)  Coronary artery disease 2)  Hyperlipidemia 3)  Hypertension 4)  Renal insufficiency   Prior History of Blood Transfusions:   Pertinent Labs:    Thank you again for agreeing to see our patient; please contact us if you have any further questions or need additional information.  Sincerely,  Julieanne Manson MD

## 2010-12-26 NOTE — Progress Notes (Signed)
Summary: Nuclear Pre-Procedure  Phone Note Outgoing Call   Call placed by: Milana Na, EMT-P,  June 27, 2010 2:57 PM Summary of Call: Reviewed information on Myoview Information Sheet (see scanned document for further details).  Spoke with patient.     Nuclear Med Background Indications for Stress Test: Evaluation for Ischemia   History: Abnormal EKG, Echo, Heart Catheterization, Myocardial Perfusion Study  History Comments: '08 MPS EF 52% [facility] anterior ischemia '08 Heart Cath N/O CAD '08 ECHO EF 65%  Symptoms: Chest Pain, Chest Pain with Exertion    Nuclear Pre-Procedure Cardiac Risk Factors: Family History - CAD, Hypertension, Lipids, NIDDM, PVD Height (in): 69  Nuclear Med Study Referring MD:  P.Sara Lee

## 2010-12-26 NOTE — Assessment & Plan Note (Signed)
Summary: FU PER MULBERRY//discuss lab results//gk   Vital Signs:  Patient profile:   60 year old male Height:      69 inches Weight:      260 pounds BMI:     38.53 Temp:     98.0 degrees F Pulse rate:   68 / minute Pulse rhythm:   regular Resp:     20 per minute BP sitting:   118 / 70  (left arm) Cuff size:   large  Vitals Entered By: Vesta Mixer CMA (August 31, 2009 9:47 AM) CC: f/u test results Is Patient Diabetic? No Pain Assessment Patient in pain? no       Does patient need assistance? Ambulation Normal   CC:  f/u test results.  History of Present Illness: 1.  Hyperglycemia:  Pt. states he is motivated to change lifestyle.  Has not met with dietician in past.  Not very physically active.  Has put on 12 lbs since last seen.  2.  Hyperlipidemia:  LDL, HDL, and triglycerides not at goal.  Taking Lipitor 10 mg regularly.  3.  CKD:  Creatinine is stable.  CBC good.  Habits & Providers  Alcohol-Tobacco-Diet     Alcohol drinks/day: <1     Alcohol type: beer--rare     Tobacco Status: current     Cigarette Packs/Day: <0.25  Exercise-Depression-Behavior     Drug Use: cocaine-crack. Uses once weekly.  Allergies (verified): No Known Drug Allergies  Social History: Packs/Day:  <0.25 Drug Use:  cocaine-crack. Uses once weekly.   Impression & Recommendations:  Problem # 1:  HYPERGLYCEMIA (ICD-790.29) Exercise! Orders: Nutrition Referral (Nutrition)  Problem # 2:  HYPERLIPIDEMIA, MIXED (ICD-272.2) Increase dose to 20 mg of Lipitor His updated medication list for this problem includes:    Lipitor 20 Mg Tabs (Atorvastatin calcium) .Marland Kitchen... 1 tab by mouth daily  Orders: Nutrition Referral (Nutrition)  Problem # 3:  DEPENDENCE, COCAINE, UNSPECIFIED (ICD-304.20) Encouraged discontinuing use with risk for MI.  Problem # 4:  TOBACCO ABUSE (ICD-305.1) Encouraged discontinuing.  Problem # 5:  HYPERTENSION (ICD-401.9) Controlled. His updated medication list  for this problem includes:    Labetalol Hcl 200 Mg Tabs (Labetalol hcl) .Marland Kitchen... 1 by mouth two times a day    Clonidine Hcl 0.1 Mg Tabs (Clonidine hcl) .Marland Kitchen... 2 tab by mouth two times a day    Lisinopril 10 Mg Tabs (Lisinopril) .Marland Kitchen... 1 by mouth once daily  Problem # 6:  DISEASE, HTN CKD, NOS, W/CKD, STG I-IV/UNSPC (ICD-403.90) Stable. His updated medication list for this problem includes:    Labetalol Hcl 200 Mg Tabs (Labetalol hcl) .Marland Kitchen... 1 by mouth two times a day    Clonidine Hcl 0.1 Mg Tabs (Clonidine hcl) .Marland Kitchen... 2 tab by mouth two times a day    Lisinopril 10 Mg Tabs (Lisinopril) .Marland Kitchen... 1 by mouth once daily  Complete Medication List: 1)  Labetalol Hcl 200 Mg Tabs (Labetalol hcl) .Marland Kitchen.. 1 by mouth two times a day 2)  Clonidine Hcl 0.1 Mg Tabs (Clonidine hcl) .... 2 tab by mouth two times a day 3)  Lisinopril 10 Mg Tabs (Lisinopril) .Marland Kitchen.. 1 by mouth once daily 4)  Lipitor 20 Mg Tabs (Atorvastatin calcium) .Marland Kitchen.. 1 tab by mouth daily  Other Orders: Flu Vaccine 51yrs + (04540) Admin 1st Vaccine (98119) Admin 1st Vaccine [facility]) [phone])  Patient Instructions: 1)  Call if you do not hear from Nutrition in next 2 weeks. 2)  Follow up with Dr. Delrae Alfred in 4  months --needs to come in at least 2 days before for fasting bloodwork:  FLP, CMET Prescriptions: LISINOPRIL 10 MG TABS (LISINOPRIL) 1 by mouth once daily  #30 x 11   Entered and Authorized by:   Julieanne Manson MD   Signed by:   Julieanne Manson MD on 08/31/2009   Method used:   Faxed to ...       [facility] - [facility] (retail)       [address] [place], [place]  [address]       Ph: [phone]       Fax: [phone]   RxID:   [id number] CLONIDINE HCL 0.1 MG TABS (CLONIDINE HCL) 2 tab by mouth two times a day  #120 x 11   Entered and Authorized by:   Julieanne Manson MD   Signed by:   Julieanne Manson MD on 08/31/2009   Method used:   Faxed to ...       [facility] - [facility] (retail)       [address] [place], [place]  [address]       Ph: [phone]       Fax: [phone]   RxID:   [id number] LABETALOL HCL 200 MG TABS (LABETALOL HCL) 1 by mouth two times a day  #60 x 11   Entered and Authorized by:   Julieanne Manson MD   Signed by:   Julieanne Manson MD on 08/31/2009   Method used:   Faxed to ...       [facility] - [facility] (retail)       [address] [place], [place]  [address]       Ph: [phone]       Fax: [phone]   RxID:   [id number] LIPITOR 20 MG TABS (ATORVASTATIN CALCIUM) 1 tab by mouth daily  #30 x 11   Entered and Authorized by:   Julieanne Manson MD   Signed by:   Julieanne Manson MD on 08/31/2009   Method used:   Faxed to ...       [facility] - [facility] (retail)       [address] [place], [place]  [address]       Ph: [phone]       Fax: [phone]   RxID:   [id number]      Influenza Vaccine    Vaccine Type: Fluvax 3+    Site: right deltoid    Mfr: Sanofi Pasteur    Dose: 0.5 ml    Route: IM    Given by: Vesta Mixer CMA    Exp. Date: 05/25/2010    Lot #: [id number]    VIS given: 06/19/07 version given August 31, 2009.  Flu Vaccine Consent Questions    Do you have a history of severe allergic reactions to this vaccine? no    Any prior history of allergic reactions to egg and/or gelatin? no    Do you have a sensitivity to the preservative Thimersol? no    Do you have a past history of Guillan-Barre Syndrome? no    Do you currently have an acute febrile illness? no    Have you ever had a severe reaction to latex? no    Vaccine information given and explained to patient? yes

## 2010-12-26 NOTE — Progress Notes (Signed)
Summary: Office Visit//DEPRESSION SCREENING  Office Visit//DEPRESSION SCREENING   Imported By: Arta Bruce 09/18/2010 12:29:04  _____________________________________________________________________  External Attachment:    Type:   Image     Comment:   External Document

## 2010-12-26 NOTE — Letter (Signed)
Summary: Lipid Letter  [facility]  [address] [place], [place] [address]   Phone: [phone]  Fax: [phone]    07/22/2008  Edward Stafford [address] [place], [place]  [address]  Dear Malcom:  We have carefully reviewed your last lipid profile from 07/21/2008 and the results are noted below with a summary of recommendations for lipid management.    Cholesterol:       138     Goal: <200   HDL "good" Cholesterol:   41     Goal: >45   LDL "bad" Cholesterol:   70     Goal: <70   Triglycerides:       135     Goal: <150    Your cholesterol is much better and basically at goal.  Keep up the changes you made--big difference.    TLC Diet (Therapeutic Lifestyle Change): Saturated Fats & Transfatty acids should be kept < 7% of total calories ***Reduce Saturated Fats Polyunstaurated Fat can be up to 10% of total calories Monounsaturated Fat Fat can be up to 20% of total calories Total Fat should be no greater than 25-35% of total calories Carbohydrates should be 50-60% of total calories Protein should be approximately 15% of total calories Fiber should be at least 20-30 grams a day ***Increased fiber may help lower LDL Total Cholesterol should be < 200mg /day Consider adding plant stanol/sterols to diet (example: Benacol spread) ***A higher intake of unsaturated fat may reduce Triglycerides and Increase HDL    Adjunctive Measures (may lower LIPIDS and reduce risk of Heart Attack) include: Aerobic Exercise (20-30 minutes 3-4 times a week) Limit Alcohol Consumption Weight Reduction Aspirin 75-81 mg a day by mouth (if not allergic or contraindicated) Vitamin E 400 IU a day by mouth Folic Acid 1mg  a day by mouth Dietary Fiber 20-30 grams a day by mouth     Current Medications: 1)    Labetalol Hcl 200 Mg Tabs (Labetalol hcl) .Marland Kitchen.. 1 by mouth two times a day 2)    Clonidine Hcl 0.1 Mg Tabs (Clonidine hcl) .... 2 tab by mouth two times a day 3)     Lisinopril 10 Mg Tabs (Lisinopril) .Marland Kitchen.. 1 by mouth once daily 4)    Lipitor 10 Mg  Tabs (Atorvastatin calcium) .Marland Kitchen.. 1 tab by mouth daily  If you have any questions, please call. We appreciate being able to work with you.   Sincerely,    [facility] Julieanne Manson MD

## 2010-12-26 NOTE — Letter (Signed)
Summary: NUTRITION SUMMARY/Edward Stafford  NUTRITION SUMMARY/Edward Stafford   Imported By: Arta Bruce 01/23/2010 10:28:48  _____________________________________________________________________  External Attachment:    Type:   Image     Comment:   External Document

## 2010-12-26 NOTE — Assessment & Plan Note (Signed)
Summary: CK SUGAR  / NS   Vital Signs:  Patient Profile:   60 Years Old Male Weight:      246 pounds Temp:     97.4 degrees F Pulse rate:   52 / minute Pulse rhythm:   regular Resp:     16 per minute BP sitting:   128 / 86  (left arm) Cuff size:   large  Pt. in pain?   no  Vitals Entered By: Vesta Mixer CMA (June 08, 2008 4:03 PM)              Is Patient Diabetic? No CBG Result 107  Does patient need assistance? Ambulation Normal     Chief Complaint:  follow up elevated cholesterol & BS ; needs med Refills.  History of Present Illness: Pt. here for follow up of Hyperlipidemia and hyperglycemia.  Non fasting today.  1.  Hyperglycemia: Has had 2 somewhat elevated nonfasting blood sugars.  Poor diet--lots of sodas, sweets and fatty foods.  2.  Hyperlipidemia:  See above.  Pt. willing to make dietary changes. Has known CAD.  See recent FLP --LDL at 130    Prior Medications Reviewed Using: Medication Bottles  Current Allergies: No known allergies         Impression & Recommendations:  Problem # 1:  HYPERGLYCEMIA (ICD-790.29) Referral to [name] Piper To come in for fasting blood sugar  Problem # 2:  HYPERLIPIDEMIA, MIXED (ICD-272.2)  His updated medication list for this problem includes:    Lipitor 10 Mg Tabs (Atorvastatin calcium) .Marland Kitchen... 1 tab by mouth daily   Complete Medication List: 1)  Labetalol Hcl 200 Mg Tabs (Labetalol hcl) .Marland Kitchen.. 1 by mouth two times a day 2)  Clonidine Hcl 0.1 Mg Tabs (Clonidine hcl) .... 2 tab by mouth two times a day 3)  Lisinopril 10 Mg Tabs (Lisinopril) .Marland Kitchen.. 1 by mouth once daily 4)  Lipitor 10 Mg Tabs (Atorvastatin calcium) .Marland Kitchen.. 1 tab by mouth daily   Patient Instructions: 1)  to come in for fasting sugar in next week 2)  Lab for FLP and liver enzymes in 6 weeks--needs to be fasting 3)  Referral to Diabetic education for dietary and exercise lifestyle changes.   Prescriptions: LIPITOR 10 MG  TABS (ATORVASTATIN  CALCIUM) 1 tab by mouth daily  #30 x 1   Entered and Authorized by:   Julieanne Manson MD   Signed by:   Julieanne Manson MD on 06/08/2008   Method used:   Print then Give to Patient   RxID:   [id number]  ]

## 2010-12-26 NOTE — Miscellaneous (Signed)
Summary: previsit prep/rm  Clinical Lists Changes  Medications: Added new medication of TRILYTE 420 GM  SOLR (PEG 3350-KCL-NA BICARB-NACL) As per prep instructions. - Signed Added new medication of DULCOLAX 5 MG  TBEC (BISACODYL) Day before procedure take 2 at 3pm and 2 at 8pm. - Signed Added new medication of REGLAN 10 MG  TABS (METOCLOPRAMIDE HCL) As per prep instructions. - Signed Rx of TRILYTE 420 GM  SOLR (PEG 3350-KCL-NA BICARB-NACL) As per prep instructions.;  #1 x 0;  Signed;  Entered by: Sherren Kerns RN;  Authorized by: Mardella Layman MD [facility];  Method used: Faxed to [facility], [address] [place], [place]  [address], Ph: [phone], Fax: [phone] Rx of DULCOLAX 5 MG  TBEC (BISACODYL) Day before procedure take 2 at 3pm and 2 at 8pm.;  #4 x 0;  Signed;  Entered by: Sherren Kerns RN;  Authorized by: Mardella Layman MD [facility];  Method used: Faxed to [facility], [address] [place], [place]  [address], Ph: [phone], Fax: [phone] Rx of REGLAN 10 MG  TABS (METOCLOPRAMIDE HCL) As per prep instructions.;  #2 x 0;  Signed;  Entered by: Sherren Kerns RN;  Authorized by: Mardella Layman MD [facility];  Method used: Faxed to [facility], [address], [place], [place]  [address], Ph: [phone], Fax: [phone] Observations: Added new observation of ALLERGY REV: Done (10/06/2010 10:17)    Prescriptions: REGLAN 10 MG  TABS (METOCLOPRAMIDE HCL) As per prep instructions.  #2 x 0   Entered by:   Sherren Kerns RN   Authorized by:   Mardella Layman MD [facility]   Signed by:   Sherren Kerns RN on 10/06/2010   Method used:   Faxed to ...       [facility] - [facility] (retail)       [address] [place], [place]  [address]       Ph: [phone]       Fax: [phone]   RxID:   [id number] DULCOLAX 5 MG  TBEC (BISACODYL) Day  before procedure take 2 at 3pm and 2 at 8pm.  #4 x 0   Entered by:   Sherren Kerns RN   Authorized by:   Mardella Layman MD [facility]   Signed by:   Sherren Kerns RN on 10/06/2010   Method used:   Faxed to ...       [facility] - [facility] (retail)       [address] [place], [place]  [address]       Ph: [phone]       Fax: [phone]   RxID:   [id number] TRILYTE 420 GM  SOLR (PEG 3350-KCL-NA BICARB-NACL) As per prep instructions.  #1 x 0   Entered by:   Sherren Kerns RN   Authorized by:   Mardella Layman MD [facility]   Signed by:   Sherren Kerns RN on 10/06/2010   Method used:   Faxed to ...       [facility] - Pharmac (retail)       [address] [place], [place]  [address]       Ph: [phone]       Fax: [phone]   RxID:   [id number]

## 2010-12-26 NOTE — Assessment & Plan Note (Signed)
Summary: CPE///RJP   Vital Signs:  Patient Profile:   60 Years Old Male Weight:      229 pounds Temp:     98.2 degrees F Pulse rate:   84 / minute Pulse rhythm:   regular Resp:     18 per minute BP sitting:   126 / 80  (left arm) Cuff size:   large  Pt. in pain?   no  Vitals Entered By: Vesta Mixer CMA (February 05, 2008 11:34 AM)                  Chief Complaint:  cpe.  History of Present Illness: 60 yo male here for CPE  Concerns:  1.  Glaucoma:  reportedly noted during a screen 2 years ago.  Has not been able to get into an eye specialist as he cannot afford and now [facility] not [facility] patients.  Have not yet checked into [facility].   Pt. may just go see Dr. Mitzi Davenport on his own--which we thought he was doing--pt. apparently not sure if he can pay 100 dollars requested.   PMH: 1.  CKD--probably hypertensive related 2.  CAD, nonobstructive.  Echo with mild LVH with no regional wall motion abnormalities.  EF 65% 3.  Hypertension.  Normal renal arteries without stenosis 4.  Tobacco abuse 5.  MJ occasionally--hx of regular use 6.  Crack Cocaine abuse--still using once weekly.Costs 10-20 dollars a hit. 7.  Dyslipidemia  PSH: 1.  Cardiac Catheterization 02/02/07:  Minimal CAD   Health Maintenance: 1.  Prostate check:  unknown.  Cannot say if had a PSA 2.  Colonoscopy:  2 years ago--normal.  Cannot say who GI was.  Done at the hospital 3.  STE:   Does perform occasionally 4.  Immunizations:  Last Td:  01/27/07  Flu:  never.  Pneumovax:  Never.        Prior Medications Reviewed Using: Medication Bottles  Prior Medication List:  LABETALOL HCL 200 MG TABS (LABETALOL HCL) 1 by mouth two times a day CLONIDINE HCL 0.1 MG TABS (CLONIDINE HCL) 2 tab by mouth two times a day LISINOPRIL 10 MG TABS (LISINOPRIL) 1 by mouth once daily   Current Allergies: No known allergies    Family History:    Mother, 34:  MI/CAD, Htn, arthritis.  Renal Cell  Carcinoma hx.    Father, died unknown age--didn't know    Brother, died age 40--killed in [place]    No children  Social History:    Single    Lives    No significant other    Previously did Estate manager/land agent work.  No work since July of 2007.   Risk Factors:  Tobacco use:  current    Year started:  started age18     Cigarettes:  Yes    Counseled to quit/cut down tobacco use:  yes Passive smoke exposure:  no Drug use:  yes    Substance:  Crack cocaine, MJ, Alcohol use:  yes    Type:  Christiane Ha, Black Label--once a month Exercise:  yes    Type:  golf sometimes Seatbelt use:  100 %   Review of Systems  General      Good energy  Eyes      No signficant subjective problems despite not getting concern for glaucoma evaluated.  Wears bifocals  ENT      Denies decreased hearing.  CV      Denies chest pain or discomfort and palpitations.  Resp      Denies shortness of breath.  GI      Denies abdominal pain, bloody stools, constipation, and dark tarry stools.  GU      Denies dysuria, erectile dysfunction, urinary frequency, and urinary hesitancy.  MS      Denies joint pain, joint redness, and joint swelling.  Derm      Denies lesion(s) and rash.      Feet with cracking and peeling.  No itching.  Feet do not sweat much  Neuro      Denies numbness, tingling, and weakness.  Psych      Denies anxiety and depression.   Physical Exam  General:     Appears older than stated age. Head:     Normocephalic and atraumatic without obvious abnormalities. No apparent alopecia or balding. Eyes:     No corneal or conjunctival inflammation noted. EOMI. Perrla. Funduscopic exam benign, without hemorrhages, exudates or papilledema, though cup to disc ratio appears increased.. Vision grossly normal. Ears:     External ear exam shows no significant lesions or deformities.  Otoscopic examination reveals clear canals, tympanic membranes are intact bilaterally without bulging,  retraction, inflammation or discharge. Hearing is grossly normal bilaterally. Nose:     External nasal examination shows no deformity or inflammation. Nasal mucosa are pink and moist without lesions or exudates. Mouth:     pharynx pink and moist, poor dentition, and teeth missing.   Neck:     No deformities, masses, or tenderness noted. Chest Wall:     No deformities, masses, tenderness or gynecomastia noted. Lungs:     Normal respiratory effort, chest expands symmetrically. Lungs are clear to auscultation, no crackles or wheezes. Heart:     Normal rate and regular rhythm. S1 and S2 normal without gallop, murmur, click, rub or other extra sounds. Abdomen:     Bowel sounds positive,abdomen soft and non-tender without masses, organomegaly or hernias noted. Rectal:     No external abnormalities noted. Normal sphincter tone. No rectal masses or tenderness.  Heme negative light brown stool.  Prostate enlarged, but smooth Genitalia:     Testes bilaterally descended without nodularity, tenderness or masses. No scrotal masses or lesions. No penis lesions or urethral discharge.  Uncircmusized Msk:     No deformity or scoliosis noted of thoracic or lumbar spine.   Pulses:     R and L carotid,radial,femoral pulses are full and equal bilaterally.  DP and PT pulses decreased but equal.  No carotid bruits. Extremities:     No clubbing, cyanosis, edema, or deformity noted with normal full range of motion of all joints.   Neurologic:     No cranial nerve deficits noted. Station and gait are normal. Plantar reflexes are down-going bilaterally. DTRs are symmetrical throughout. Sensory, motor and coordinative functions appear intact. Skin:     Extremely dry and flaky. Feet with yellowish diffuse flaking mainly on plantar aspect--in between toes as well.  Toenails diffusely thickened and yellowed. Cervical Nodes:     No lymphadenopathy noted Axillary Nodes:     No palpable lymphadenopathy Inguinal  Nodes:     No significant adenopathy Psych:     Cognition and judgment appear intact. Alert and cooperative with normal attention span and concentration. No apparent delusions, illusions, hallucinations    Impression & Recommendations:  Problem # 1:  HEALTH MAINTENANCE EXAM (ICD-V70.0) Encouraged discontinuation of MJ, Crack cocaine, and tobacco. Regular exercise. To return for fasting labs.  FLP, CMET, CBC, UA,  PSA  Problem # 2:  GLAUCOMA (ICD-365.9) To go get eyes checked as he planned with Dr. Mitzi Davenport.   Suggested using money he saved from not using crack to pay for his visits there.  Complete Medication List: 1)  Labetalol Hcl 200 Mg Tabs (Labetalol hcl) .Marland Kitchen.. 1 by mouth two times a day 2)  Clonidine Hcl 0.1 Mg Tabs (Clonidine hcl) .... 2 tab by mouth two times a day 3)  Lisinopril 10 Mg Tabs (Lisinopril) .Marland Kitchen.. 1 by mouth once daily   Patient Instructions: 1)  Return fasting in next q1 week for fasting labs:  FLP, CBC, CMET, UA, PSA 2)  Stop Crack cocaine use--it could cause a heart attack and you can use the money for your eye check --which is very important.    ]  Laboratory Results   Urine Tests    Routine Urinalysis   Glucose: negative   (Normal Range: Negative) Bilirubin: small   (Normal Range: Negative) Ketone: trace (5)   (Normal Range: Negative) Spec. Gravity: >=1.030   (Normal Range: 1.003-1.035) Blood: negative   (Normal Range: Negative) pH: 5.0   (Normal Range: 5.0-8.0) Protein: 100   (Normal Range: Negative) Urobilinogen: negative   (Normal Range: 0-1) Nitrite: negative   (Normal Range: Negative) Leukocyte Esterace: negative   (Normal Range: Negative)

## 2010-12-26 NOTE — Progress Notes (Signed)
Summary: GI referral  Phone Note Outgoing Call   Summary of Call: Edward Stafford--referral for screening colonoscopy Initial call taken by: Julieanne Manson MD,  September 15, 2010 9:56 AM  Follow-up for Phone Call        Pt need to wait until next month to try to schedule her with [facility] because [facility] can't see any more [facility] pts.  Follow-up by: Cheryll Dessert,  September 15, 2010 12:06 PM  Additional Follow-up for Phone Call Additional follow up Details #1::        SEND REFERRAL  BY FAX TO [facility] WAITING  FOR AN APPT  Additional Follow-up by: Cheryll Dessert,  September 27, 2010 12:54 PM

## 2010-12-26 NOTE — Miscellaneous (Signed)
  Clinical Lists Changes  Problems: Changed problem from GLAUCOMA (ICD-365.9) to GLAUCOMA (ICD-365.9) - noted againwith [facility] 09/18/10

## 2010-12-26 NOTE — Letter (Signed)
Summary: Lipid Letter  [facility]  [address] [place], [place] [address]   Phone: [phone]  Fax: [phone]    02/14/2008  Edward Stafford [address] [place], [place]  [address]  Dear [name]:  We have carefully reviewed your last lipid profile from 02/12/2008 and the results are noted below with a summary of recommendations for lipid management.    Cholesterol:       217     Goal: <200   HDL "good" Cholesterol:   45     Goal: >45   LDL "bad" Cholesterol:   130     Goal: <70   Triglycerides:       212     Goal: <150    Your cholesterol  and blood sugar are too high.  Please make an appt. to discuss medication.  I would like you to come fasting in the morning for the appt, so we can recheck your sugar (nothing to eat or drink other than water after midnight).  The rest of your labs were okay.    TLC Diet (Therapeutic Lifestyle Change): Saturated Fats & Transfatty acids should be kept < 7% of total calories ***Reduce Saturated Fats Polyunstaurated Fat can be up to 10% of total calories Monounsaturated Fat Fat can be up to 20% of total calories Total Fat should be no greater than 25-35% of total calories Carbohydrates should be 50-60% of total calories Protein should be approximately 15% of total calories Fiber should be at least 20-30 grams a day ***Increased fiber may help lower LDL Total Cholesterol should be < 200mg /day Consider adding plant stanol/sterols to diet (example: Benacol spread) ***A higher intake of unsaturated fat may reduce Triglycerides and Increase HDL    Adjunctive Measures (may lower LIPIDS and reduce risk of Heart Attack) include: Aerobic Exercise (20-30 minutes 3-4 times a week) Limit Alcohol Consumption Weight Reduction Aspirin 75-81 mg a day by mouth (if not allergic or contraindicated) Vitamin E 400 IU a day by mouth Folic Acid 1mg  a day by mouth Dietary Fiber 20-30 grams a day by mouth     Current  Medications: 1)    Labetalol Hcl 200 Mg Tabs (Labetalol hcl) .Marland Kitchen.. 1 by mouth two times a day 2)    Clonidine Hcl 0.1 Mg Tabs (Clonidine hcl) .... 2 tab by mouth two times a day 3)    Lisinopril 10 Mg Tabs (Lisinopril) .Marland Kitchen.. 1 by mouth once daily  If you have any questions, please call. We appreciate being able to work with you.   Sincerely,    [facility] Julieanne Manson MD

## 2010-12-26 NOTE — Assessment & Plan Note (Signed)
Summary: np3/chest pain/jml   Primary Provider:  Julieanne Manson MD  CC:  referal from Dr. Delrae Alfred.Marland KitchenMarland Kitchenpt was having chest pain he took nitro which hepled.  History of Present Illness: Edward Stafford is seen today at the request of Dr Delrae Alfred.  He has longstanding history of HTN.  Has been having SSCP.  Previously seen by Dr Jim Like with normal cath in 2008.  Pain is sharp center chest.  Not always exertional but is responsive to nitro.  He walks a dog 1/mile each morning with no pain.  New onset about 3-4 weeks ago.  Persistant but not worsening.  No associated diaphoresis, dyspnea palpitatons or syncope.  Compliant with his BP meds.  No RAS on cath in 2008.    Current Problems (verified): 1)  Hypertension  (ICD-401.9) 2)  Hyperlipidemia, Mixed  (ICD-272.2) 3)  Coronary Artery Disease  (ICD-414.00) 4)  Sinus Bradycardia  (ICD-427.81) 5)  Chest Pain  (ICD-786.50) 6)  Gerd  (ICD-530.81) 7)  Encounter For Long-term Use of Other Medications  (ICD-V58.69) 8)  Diabetes Mellitus, Type II, Controlled  (ICD-250.00) 9)  Health Maintenance Exam  (ICD-V70.0) 10)  Glaucoma  (ICD-365.9) 11)  Tobacco Abuse  (ICD-305.1) 12)  Marijuana Abuse  (ICD-305.20) 13)  Dependence, Cocaine, Unspecified  (ICD-304.20) 14)  Disease, Htn Ckd, Nos, W/ckd, Stg I-iv/unspc  (ICD-403.90)  Current Medications (verified): 1)  Labetalol Hcl 200 Mg Tabs (Labetalol Hcl) .Marland Kitchen.. 1 By Mouth Two Times A Day 2)  Clonidine Hcl 0.2 Mg Tabs (Clonidine Hcl) .... Take One Tablet By Mouth Twice A Day 3)  Lisinopril 10 Mg Tabs (Lisinopril) .Marland Kitchen.. 1 By Mouth Once Daily 4)  Lipitor 20 Mg Tabs (Atorvastatin Calcium) .Marland Kitchen.. 1 Tab By Mouth Daily 5)  Aspirin Ec 81 Mg Tbec (Aspirin) .Marland Kitchen.. 1 Tab By Mouth Daily With A Meal 6)  Famotidine 20 Mg Tabs (Famotidine) .... 2 Tabs By Mouth At Bedtime 7)  Nitrostat 0.4 Mg Subl (Nitroglycerin) .Marland Kitchen.. 1 Tab Sublingual As Needed Chest Pain.  May Repeat Every 5 Minutes X2 If Chest Pain Not Relieved  Allergies  (verified): No Known Drug Allergies  Past History:  Past Medical History: Last updated: 04/19/2007 Coronary artery disease Hyperlipidemia Hypertension Renal insufficiency  Past Surgical History: Last updated: 04/19/2007 normal renal arteries without stenosis in the past  Family History: Last updated: 02/05/2008 Mother, 18:  MI/CAD, Htn, arthritis.  Renal Cell Carcinoma hx. Father, died unknown age--didn't know Brother, died age 62--killed in [place] No children  Social History: Last updated: 02/05/2008 Single Lives No significant other Previously did janitorial work.  No work since July of 2007.  Review of Systems       Denies fever, malais, weight loss, blurry vision, decreased visual acuity, cough, sputum, SOB, hemoptysis, pleuritic pain, palpitaitons, heartburn, abdominal pain, melena, lower extremity edema, claudication, or rash.   Vital Signs:  Patient profile:   60 year old male Height:      69 inches Weight:      261 pounds BMI:     38.68 Pulse rate:   60 / minute Resp:     14 per minute BP sitting:   145 / 88  (left arm)  Vitals Entered By: Kem Parkinson (June 22, 2010 9:12 AM)  Physical Exam  General:  Affect appropriate Healthy:  appears stated age HEENT: normal Neck supple with no adenopathy JVP normal no bruits no thyromegaly Lungs clear with no wheezing and good diaphragmatic motion Heart:  S1/S2 no murmur,rub, gallop or click PMI normal Abdomen: benighn, BS  positve, no tenderness, no AAA no bruit.  No HSM or HJR Distal pulses intact with no bruits No edema Neuro non-focal Skin warm and dry    Impression & Recommendations:  Problem # 1:  HYPERTENSION (ICD-401.9) Well controlled Warned him about rebound HTN if he stops his clonidine His updated medication list for this problem includes:    Labetalol Hcl 200 Mg Tabs (Labetalol hcl) .Marland Kitchen... 1 by mouth two times a day    Clonidine Hcl 0.2 Mg Tabs (Clonidine hcl) .Marland Kitchen... Take one tablet  by mouth twice a day    Lisinopril 10 Mg Tabs (Lisinopril) .Marland Kitchen... 1 by mouth once daily    Aspirin Ec 81 Mg Tbec (Aspirin) .Marland Kitchen... 1 tab by mouth daily with a meal  Problem # 2:  HYPERLIPIDEMIA, MIXED (ICD-272.2) At goal with no documented CAD His updated medication list for this problem includes:    Lipitor 20 Mg Tabs (Atorvastatin calcium) .Marland Kitchen... 1 tab by mouth daily  CHOL: 123 (01/19/2010)   LDL: 55 (01/19/2010)   HDL: 41 (01/19/2010)   TG: 134 (01/19/2010)  Problem # 3:  CHEST PAIN (ICD-786.50) Atypical features with LVH on ECG but responsive to nitro.  Normal cath in 2008  Stress Myovue His updated medication list for this problem includes:    Labetalol Hcl 200 Mg Tabs (Labetalol hcl) .Marland Kitchen... 1 by mouth two times a day    Lisinopril 10 Mg Tabs (Lisinopril) .Marland Kitchen... 1 by mouth once daily    Aspirin Ec 81 Mg Tbec (Aspirin) .Marland Kitchen... 1 tab by mouth daily with a meal    Nitrostat 0.4 Mg Subl (Nitroglycerin) .Marland Kitchen... 1 tab sublingual as needed chest pain.  may repeat every 5 minutes x2 if chest pain not relieved  Other Orders: Nuclear Stress Test (Nuc Stress Test)  Patient Instructions: 1)  Your physician recommends that you schedule a follow-up appointment in: AS NEEDED PENDING TEST RESULTS 2)  Your physician has requested that you have an exercise stress myoview.  For further information please visit [email or web address]  Please follow instruction sheet, as given.   EKG Report  Procedure date:  06/22/2010  Findings:      NSR LVH

## 2010-12-26 NOTE — Letter (Signed)
Summary: REFERRAL/[facility]  REFERRAL/[facility]   Imported By: Arta Bruce 04/20/2010 12:47:16  _____________________________________________________________________  External Attachment:    Type:   Image     Comment:   External Document

## 2010-12-26 NOTE — Letter (Signed)
Summary: Lipid Letter  [facility]  [address] [place], [place] [address]   Phone: [phone]  Fax: [phone]    01/28/2010  Edward Stafford [address] [place], [place]  [address]  Dear Edward Stafford:  We have carefully reviewed your last lipid profile from 01/19/2010 and the results are noted below with a summary of recommendations for lipid management.    Cholesterol:       123     Goal: <200   HDL "good" Cholesterol:   41     Goal: >45   LDL "bad" Cholesterol:   55     Goal: <70   Triglycerides:       134     Goal: <150    Your cholesterol is generally quite good, though would like to see your "good" cholesterol a bit higher--exercise will help this.  Your kidney function is stable, though still a bit decreased.  The rest of your labs were fine.    TLC Diet (Therapeutic Lifestyle Change): Saturated Fats & Transfatty acids should be kept < 7% of total calories ***Reduce Saturated Fats Polyunstaurated Fat can be up to 10% of total calories Monounsaturated Fat Fat can be up to 20% of total calories Total Fat should be no greater than 25-35% of total calories Carbohydrates should be 50-60% of total calories Protein should be approximately 15% of total calories Fiber should be at least 20-30 grams a day ***Increased fiber may help lower LDL Total Cholesterol should be < 200mg /day Consider adding plant stanol/sterols to diet (example: Benacol spread) ***A higher intake of unsaturated fat may reduce Triglycerides and Increase HDL    Adjunctive Measures (may lower LIPIDS and reduce risk of Heart Attack) include: Aerobic Exercise (20-30 minutes 3-4 times a week) Limit Alcohol Consumption Weight Reduction Aspirin 75-81 mg a day by mouth (if not allergic or contraindicated) Dietary Fiber 20-30 grams a day by mouth     Current Medications: 1)    Labetalol Hcl 200 Mg Tabs (Labetalol hcl) .Marland Kitchen.. 1 by mouth two times a day 2)    Clonidine Hcl 0.1 Mg Tabs  (Clonidine hcl) .... 2 tab by mouth two times a day 3)    Lisinopril 10 Mg Tabs (Lisinopril) .Marland Kitchen.. 1 by mouth once daily 4)    Lipitor 20 Mg Tabs (Atorvastatin calcium) .Marland Kitchen.. 1 tab by mouth daily 5)    Aspirin Ec 81 Mg Tbec (Aspirin) .Marland Kitchen.. 1 tab by mouth daily with a meal 6)    Famotidine 40 Mg Tabs (Famotidine) .Marland Kitchen.. 1 tab by mouth at bedtime  If you have any questions, please call. We appreciate being able to work with you.   Sincerely,    [facility] Julieanne Manson MD

## 2010-12-26 NOTE — Letter (Signed)
Summary: Public relations account [facility]  [facility]   Imported By: Arta Bruce 10/25/2010 16:04:33  _____________________________________________________________________  External Attachment:    Type:   Image     Comment:   External Document

## 2010-12-26 NOTE — Letter (Signed)
Summary: *HSN Results Follow up  [facility]  [address] [place], [place] [address]   Phone: [phone]  Fax: [phone]      05/25/2010   [facility] [address] [place], [place]  [address]   Dear  [name],                            ____S.Drinkard,FNP   ____D. Gore,FNP       ____B. McPherson,MD   ____V. Rankins,MD    __X__E. Ellerie Arenz,MD    ____N. Daphine Deutscher, FNP  ____D. Reche Dixon, MD    ____K. Philipp Deputy, MD    ____Other     This letter is to inform you that your recent test(s):  _______Pap Smear    _______Lab Test     ____X___X-ray    _______ is within acceptable limits  _______ requires a medication change  _______ requires a follow-up lab visit  _______ requires a follow-up visit with your provider   Comments:  Chest Xray did not show anything unexpected with your history of high blood pressure--did not show anything worrisome.       _________________________________________________________ If you have any questions, please contact our office                     Sincerely,  Edward Manson MD [facility]

## 2010-12-26 NOTE — Assessment & Plan Note (Signed)
Summary: new onset dm/tmm   Vital Signs:  Patient profile:   60 year old male Weight:      262 pounds Temp:     98.8 degrees F Pulse rate:   59 / minute Pulse rhythm:   regular Resp:     16 per minute BP sitting:   112 / 73  (left arm) Cuff size:   large  Vitals Entered By: Vesta Mixer CMA (March 21, 2010 8:47 AM) CC: f/u on new diabetes diagnosis Is Patient Diabetic? Yes Pain Assessment Patient in pain? no      CBG Result 134  Does patient need assistance? Ambulation Normal   CC:  f/u on new diabetes diagnosis.  History of Present Illness: 1.  DM:  pt. states he no longer drinks regular Coke--now Coke Zero.  Drinking a lot of water.  Has tried to improve fats in diet.  Still eating highly processed breads.  Sugar coated cereal this morning before coming in.  Discussed long term complications of DM.  Not checking feet daily.  Cholesterol at goal with medication other than a bit low HDL recently.  Walks daily.  Does not get eyes checked regularly--wears glasses, last eye check 2 years ago.  Taking 81 mg ASA daily.  On Lisinopril already for problem #2.  2.  Hypertension:  Has not missed meds.  No problems with meds.  3.  GERD:  no symptoms on Famotidine.  Allergies (verified): No Known Drug Allergies  Physical Exam  General:  NAD Lungs:  Normal respiratory effort, chest expands symmetrically. Lungs are clear to auscultation, no crackles or wheezes. Heart:  Normal rate and regular rhythm. S1 and S2 normal without gallop, murmur, click, rub or other extra sounds.  Radial pulses normal and equal.   Impression & Recommendations:  Problem # 1:  DIABETES MELLITUS, TYPE II, CONTROLLED (ICD-250.00) Pt. would like to hold on medication for now and work on lifestyle changes--he is now willing to go to Diabetic Education/nutrition. His updated medication list for this problem includes:    Lisinopril 10 Mg Tabs (Lisinopril) .Marland Kitchen... 1 by mouth once daily    Aspirin Ec 81 Mg  Tbec (Aspirin) .Marland Kitchen... 1 tab by mouth daily with a meal  Orders: Capillary Blood Glucose/CBG ([address]) Nutrition Referral (Nutrition)  Problem # 2:  GERD (ICD-530.81) Controlled His updated medication list for this problem includes:    Famotidine 40 Mg Tabs (Famotidine) .Marland Kitchen... 1 tab by mouth at bedtime  Problem # 3:  DISEASE, HTN CKD, NOS, W/CKD, STG I-IV/UNSPC (ICD-403.90) BP much better today.  CPM His updated medication list for this problem includes:    Labetalol Hcl 200 Mg Tabs (Labetalol hcl) .Marland Kitchen... 1 by mouth two times a day    Clonidine Hcl 0.1 Mg Tabs (Clonidine hcl) .Marland Kitchen... 2 tab by mouth two times a day    Lisinopril 10 Mg Tabs (Lisinopril) .Marland Kitchen... 1 by mouth once daily  Complete Medication List: 1)  Labetalol Hcl 200 Mg Tabs (Labetalol hcl) .Marland Kitchen.. 1 by mouth two times a day 2)  Clonidine Hcl 0.1 Mg Tabs (Clonidine hcl) .... 2 tab by mouth two times a day 3)  Lisinopril 10 Mg Tabs (Lisinopril) .Marland Kitchen.. 1 by mouth once daily 4)  Lipitor 20 Mg Tabs (Atorvastatin calcium) .Marland Kitchen.. 1 tab by mouth daily 5)  Aspirin Ec 81 Mg Tbec (Aspirin) .Marland Kitchen.. 1 tab by mouth daily with a meal 6)  Famotidine 40 Mg Tabs (Famotidine) .Marland Kitchen.. 1 tab by mouth at bedtime  Patient Instructions:  1)  Schedule [facility]. 2)  CPE with Dr. Delrae Alfred in 4 months 3)  Call if you do not hear from Nutrition/diabetic education in 2 weeks.  Appended Document: new onset dm/tmm    Clinical Lists Changes  Orders: Added new Service order of Est. Patient Level III ([address]) - Signed

## 2010-12-26 NOTE — Progress Notes (Signed)
  Phone Note From Other Clinic   Summary of Call: Nutrition sent note --[name] did not keep his appt with them 10/26--please call him and find out if he is motivated to go to an appt. with them-would really recommend.  If he is ever unable to keep an appt., please remind him be courteous and call ahead 24 hours to cancel--or if something comes up, to call and explain and reschedule on his own.  If he is interested in going--please refax order and get rescheduled. Initial call taken by: Edward Manson MD,  October 03, 2009 11:36 AM  Follow-up for Phone Call        spoke with pt and he said he came and spoke with Edward Stafford.....Marland Kitchenand it shows pt arrived.......Marland KitchenArmenia Stafford  October 05, 2009 8:42 AM   Additional Follow-up for Phone Call Additional follow up Details #1::        Okay--not sure why he had appts with both--I was referring him to the [facility] nutrition for which he did not show --how did he get to [name]? Additional Follow-up by: Edward Manson MD,  October 12, 2009 10:27 PM    Additional Follow-up for Phone Call Additional follow up Details #2::    Edward Stafford scheduled appt.......Marland KitchenArmenia Stafford  October 18, 2009 9:53 AM  Is he interested in more nutritional counseling, or does he feel he has a pretty good idea what to do with his diet?  If he would like more counseling, please set him back up with [name].  Edward Manson MD  November 02, 2009 10:06 PM    Additional Follow-up for Phone Call Additional follow up Details #3:: Details for Additional Follow-up Action Taken: pt states that he has a pretty good idea on what to do with his die will do appt with [name]...Marland KitchenMarland KitchenGeanie Stafford  November 08, 2009 12:40 PM

## 2010-12-26 NOTE — Assessment & Plan Note (Signed)
Summary: CPE///KT   Vital Signs:  Patient profile:   60 year old male Height:      70 inches Weight:      262.2 pounds Temp:     97.4 degrees F oral Pulse rate:   60 / minute Pulse rhythm:   regular Resp:     14 per minute BP sitting:   112 / 76  (right arm) Cuff size:   large  Vitals Entered By: Michelle Nasuti (September 15, 2010 9:16 AM) CC: CPE Is Patient Diabetic? No Pain Assessment Patient in pain? no       Does patient need assistance? Functional Status Self care Ambulation Normal   Primary Care Provider:  Julieanne Manson MD  CC:  CPE.  History of Present Illness: 60 yo male here for CPE.  No concerns  Allergies (verified): No Known Drug Allergies  Past History:  Past Medical History: Reviewed history from 06/19/2010 and no changes required. HYPERTENSION HYPERLIPIDEMIA, MIXED  CORONARY ARTERY DISEASE SINUS BRADYCARDIA  CHEST PAIN GERD ENCOUNTER FOR LONG-TERM USE OF OTHER MEDICATIONS  DIABETES MELLITUS, TYPE II, CONTROLLED HEALTH MAINTENANCE EXAM GLAUCOMA  TOBACCO ABUSE  MARIJUANA ABUSE  DEPENDENCE, COCAINE, UNSPECIFIED DISEASE, HTN CKD, NOS, W/CKD, STG I-IV/UNSPC  Renal insufficiency  Past Surgical History: 1.  2002: Laparoscopic cholecystectomy. 2.  2002:Endoscopic retrograde cholangiopancreatography. 3.  2008:  Cardiac Catheterization:  Dr. Eldridge Dace.  Nonobstructive mild CAD  Family History: Mother, 76:  MI/CAD, Htn, arthritis.  Renal Cell Carcinoma hx. Father, died unknown age--didn't know Brother, died age 43--killed in [place] No children  Social History: Single Lives with mother No significant other Previously did Estate manager/land agent work.  No work since July of 2007. Tobacco:  Occasional cigarette--not very often now.  Started age 89 Alcohol:  Hx of overuse.  Rare now. Drugs:  Occasional use of marijuana, crack cocaine--weekly basis mainly.  No hx of IV drug use.  Review of Systems General:  Energy is good. Eyes:   Bifocals--stable.  Cannot recall last eye check. Sounds like [facility]--on no eye meds, however.  Was going to [facility] --states he does not have any further follow up.. ENT:  Denies decreased hearing. CV:  Denies chest pain or discomfort; Had Cardiolite stress test with Dr. Eden Emms 06/2010--no signs of ischemia.  poor conditioning and hypertensive response to exercise.Marland Kitchen Resp:  Denies cough and shortness of breath. GI:  Denies abdominal pain, bloody stools, and dark tarry stools; Occasional constipation and diarrhea. GU:  Denies erectile dysfunction; nocturia x 2--does not bother him.  Good urine stream. MS:  Denies joint pain, joint redness, and joint swelling. Derm:  Denies lesion(s) and rash. Neuro:  Denies numbness, tingling, and weakness. Psych:  Denies anxiety, depression, and suicidal thoughts/plans.  Physical Exam  General:  obese, NAD Head:  Normocephalic and atraumatic without obvious abnormalities. No apparent alopecia or balding. Eyes:  No corneal or conjunctival inflammation noted. EOMI. Perrla. Funduscopic exam benign, without hemorrhages, exudates or papilledema. Vision grossly normal. Ears:  External ear exam shows no significant lesions or deformities.  Otoscopic examination reveals clear canals, tympanic membranes are intact bilaterally without bulging, retraction, inflammation or discharge. Hearing is grossly normal bilaterally. Nose:  External nasal examination shows no deformity or inflammation. Nasal mucosa are pink and moist without lesions or exudates. Mouth:  Oral mucosa and oropharynx without lesions or exudates.  poor dentition and teeth missing.   Neck:  No deformities, masses, or tenderness noted. Chest  Wall:  No deformities, masses, tenderness or gynecomastia noted. Breasts:  No masses or gynecomastia noted Lungs:  Normal respiratory effort, chest expands symmetrically. Lungs are clear to auscultation, no  crackles or wheezes. Heart:  Normal rate and regular rhythm. S1 and S2 normal without gallop, murmur, click, rub or other extra sounds. Abdomen:  Bowel sounds positive,abdomen soft and non-tender without masses, organomegaly or hernias noted. Rectal:  External tags. Normal sphincter tone. No rectal masses or tenderness.  Heme negative brown stool Genitalia:  Testes bilaterally descended without nodularity, tenderness or masses. No scrotal masses or lesions. No penis lesions or urethral discharge.uncircumcised.  No hernias Prostate:  Prostate gland firm and smooth, no enlargement, nodularity, tenderness, mass, asymmetry or induration. Msk:  No deformity or scoliosis noted of thoracic or lumbar spine.   Pulses:  R and L carotid,radial,femoral,dorsalis pedis and posterior tibial pulses are full and equal bilaterally Extremities:  No clubbing, cyanosis, edema, or deformity noted with normal full range of motion of all joints.   Neurologic:  No cranial nerve deficits noted. Station and gait are normal. Plantar reflexes are down-going bilaterally. DTRs are symmetrical throughout. Sensory, motor and coordinative functions appear intact. Skin:  Generalized dryness and flakiness. Cervical Nodes:  No lymphadenopathy noted Axillary Nodes:  No palpable lymphadenopathy Inguinal Nodes:  No significant adenopathy Psych:  Cognition and judgment appear intact. Alert and cooperative with normal attention span and concentration. No apparent delusions, illusions, hallucinations  Diabetes Management Exam:    Foot Exam (with socks and/or shoes not present):       Sensory-Monofilament:          Left foot: normal          Right foot: normal   Impression & Recommendations:  Problem # 1:  HEALTH MAINTENANCE EXAM (ICD-V70.0) Guaiac cards X3 to return in 2 weeks Pneumovax and Flu vaccine Orders: T-HIV Antibody  (Reflex) ([id number]) T-Syphilis Test (RPR) [phone]) T-Chlamydia  Probe, urine  ([id number]) T-GC Probe, urine (06301-60109) Gastroenterology Referral (GI)  Problem # 2:  DIABETES MELLITUS, TYPE II, CONTROLLED (ICD-250.00) Diabetic foot exam His updated medication list for this problem includes:    Lisinopril 10 Mg Tabs (Lisinopril) .Marland Kitchen... 1 by mouth once daily    Aspirin Ec 81 Mg Tbec (Aspirin) .Marland Kitchen... 1 tab by mouth daily with a meal  Orders: UA Dipstick w/o Micro (manual) (32355) Hemoglobin A1C (73220) T-Comprehensive Metabolic Panel (25427-06237)  Problem # 3:  CORONARY ARTERY DISEASE (ICD-414.00) Medically controlled His updated medication list for this problem includes:    Labetalol Hcl 200 Mg Tabs (Labetalol hcl) .Marland Kitchen... 1 by mouth two times a day    Clonidine Hcl 0.2 Mg Tabs (Clonidine hcl) .Marland Kitchen... Take one tablet by mouth twice a day    Lisinopril 10 Mg Tabs (Lisinopril) .Marland Kitchen... 1 by mouth once daily    Aspirin Ec 81 Mg Tbec (Aspirin) .Marland Kitchen... 1 tab by mouth daily with a meal    Nitrostat 0.4 Mg Subl (Nitroglycerin) .Marland Kitchen... 1 tab sublingual as needed chest pain.  may repeat every 5 minutes x2 if chest pain not relieved  Problem # 4:  DISEASE, HTN CKD, NOS, W/CKD, STG I-IV/UNSPC (ICD-403.90) BP controlled His updated medication list for this problem includes:    Labetalol Hcl 200 Mg Tabs (Labetalol hcl) .Marland Kitchen... 1 by mouth two times a day    Clonidine Hcl 0.2 Mg Tabs (Clonidine hcl) .Marland Kitchen... Take one tablet by mouth twice a day    Lisinopril 10 Mg Tabs (Lisinopril) .Marland Kitchen... 1 by mouth once  daily  Orders: T-Comprehensive Metabolic Panel [id number])  Problem # 5:  GLAUCOMA (ICD-365.9) Check to see if any correspondence records from [facility] regarding vison  Complete Medication List: 1)  Labetalol Hcl 200 Mg Tabs (Labetalol hcl) .Marland Kitchen.. 1 by mouth two times a day 2)  Clonidine Hcl 0.2 Mg Tabs (Clonidine hcl) .... Take one tablet by mouth twice a day 3)  Lisinopril 10 Mg Tabs (Lisinopril) .Marland Kitchen.. 1 by mouth once daily 4)  Lipitor 20 Mg Tabs (Atorvastatin calcium) .Marland Kitchen.. 1  tab by mouth daily 5)  Aspirin Ec 81 Mg Tbec (Aspirin) .Marland Kitchen.. 1 tab by mouth daily with a meal 6)  Famotidine 20 Mg Tabs (Famotidine) .... 2 tabs by mouth at bedtime 7)  Nitrostat 0.4 Mg Subl (Nitroglycerin) .Marland Kitchen.. 1 tab sublingual as needed chest pain.  may repeat every 5 minutes x2 if chest pain not relieved  Other Orders: T-Lipid Profile ([id number]) T-CBC w/Diff (82423-53614) Influenza Vaccine NON MCR (43154) Pneumococcal Vaccine (00867) Admin 1st Vaccine (61950)  Patient Instructions: 1)  Schedule  Retasure 2)  Follow up with Dr. Delrae Alfred in 6 months --DM, htn Prescriptions: CLONIDINE HCL 0.2 MG TABS (CLONIDINE HCL) Take one tablet by mouth twice a day  #60 x 11   Entered and Authorized by:   Julieanne Manson MD   Signed by:   Julieanne Manson MD on 09/15/2010   Method used:   Faxed to ...       [facility] - [facility] (retail)       [address] [place], [place]  [address]       Ph: [phone]       Fax: [phone]   RxID:   [id number] FAMOTIDINE 20 MG TABS (FAMOTIDINE) 2 tabs by mouth at bedtime  #60 x 11   Entered and Authorized by:   Julieanne Manson MD   Signed by:   Julieanne Manson MD on 09/15/2010   Method used:   Faxed to ...       [facility] - [facility] (retail)       [address] [place], [place]  [address]       Ph: [phone]       Fax: [phone]   RxID:   [id number] LIPITOR 20 MG TABS (ATORVASTATIN CALCIUM) 1 tab by mouth daily  #30 x 11   Entered and Authorized by:   Julieanne Manson MD   Signed by:   Julieanne Manson MD on 09/15/2010   Method used:   Faxed to ...       [facility] - Pharmac (retail)       [address] [place], [place]  [address]       Ph: [phone]       Fax: [phone]   RxID:   [id number] LISINOPRIL 10 MG TABS (LISINOPRIL) 1 by mouth once daily  #30 x 11   Entered and Authorized by:    Julieanne Manson MD   Signed by:   Julieanne Manson MD on 09/15/2010   Method used:   Faxed to ...       [facility] - [facility] (retail)       [address] [place], [place]  [address]       Ph: [phone]       Fax: [phone]   RxID:  [id number] LABETALOL HCL 200 MG TABS (LABETALOL HCL) 1 by mouth two times a day  #60 x 11   Entered and Authorized by:   Julieanne Manson MD   Signed by:   Julieanne Manson MD on 09/15/2010   Method used:   Faxed to ...       [facility] - [facility] (retail)       [address] [place], [place]  [address]       Ph: [phone]       Fax: [phone]   RxID:   [id number]    Orders Added: 1)  Est. Patient age 5-64 [50] 2)  UA Dipstick w/o Micro (manual) [81002] 3)  Hemoglobin A1C [83036] 4)  T-Comprehensive Metabolic Panel [80053-22900] 5)  T-Lipid Profile [80061-22930] 6)  T-CBC w/Diff [64403-47425] 7)  T-HIV Antibody  (Reflex) [95638-75643] 8)  T-Syphilis Test (RPR) [32951-88416] 9)  T-Chlamydia  Probe, urine [phone] 10)  T-GC Probe, urine [phone] 11)  Gastroenterology Referral [GI] 12)  Influenza Vaccine NON MCR [00028] 13)  Pneumococcal Vaccine [90732] 14)  Admin 1st Vaccine [90471]   Immunizations Administered:  Influenza Vaccine # 1:    Vaccine Type: Fluvax Non-MCR    Site: right deltoid    Mfr: GlaxoSmithKline    Dose: 0.5 ml    Route: IM    Given by: Michelle Nasuti    Exp. Date: 05/26/2011    Lot #: [id number]    VIS given: 06/20/10 version given September 15, 2010.  Pneumonia Vaccine:    Vaccine Type: Pneumovax    Site: right deltoid    Mfr: Merck    Dose: 0.5 ml    Route: IM    Given by: Michelle Nasuti    Exp. Date: 02/12/2012    Lot #: [id number]    VIS given: 10/31/09 version given September 15, 2010.  Flu Vaccine Consent Questions:    Do you have a history of severe allergic reactions to this vaccine? no    Any  prior history of allergic reactions to egg and/or gelatin? no    Do you have a sensitivity to the preservative Thimersol? no    Do you have a past history of Guillan-Barre Syndrome? no    Do you currently have an acute febrile illness? no    Have you ever had a severe reaction to latex? no    Vaccine information given and explained to patient? yes   Preventive Care Screening  Prior Values:    PSA:  3.70 (02/12/2008)    Last Tetanus Booster:  Historical (01/27/2007)    Last Flu Shot:  Fluvax 3+ (08/31/2009)     Guaiac Cards:  Has never returned Colonoscopy:  never Immunizations:  never had a pneumovax   Immunizations Administered:  Influenza Vaccine # 1:    Vaccine Type: Fluvax Non-MCR    Site: right deltoid    Mfr: GlaxoSmithKline    Dose: 0.5 ml    Route: IM    Given by: Michelle Nasuti    Exp. Date: 05/26/2011    Lot #: [id number]    VIS given: 06/20/10 version given September 15, 2010.  Pneumonia Vaccine:    Vaccine Type: Pneumovax    Site: right deltoid    Mfr: Merck    Dose: 0.5 ml    Route: IM    Given by: Michelle Nasuti    Exp. Date: 02/12/2012    Lot #: [id number]    VIS given: 10/31/09 version  given September 15, 2010.  Laboratory Results   Urine Tests  Date/Time Received: September 15, 2010 Date/Time Reported: 10:02 AM  Routine Urinalysis   Color: lt. yellow Appearance: Clear Glucose: negative   (Normal Range: Negative) Bilirubin: negative   (Normal Range: Negative) Ketone: trace (5)   (Normal Range: Negative) Spec. Gravity: >=1.030   (Normal Range: 1.003-1.035) Blood: trace-intact   (Normal Range: Negative) pH: 5.0   (Normal Range: 5.0-8.0) Protein: negative   (Normal Range: Negative) Urobilinogen: 0.2   (Normal Range: 0-1) Nitrite: negative   (Normal Range: Negative) Leukocyte Esterace: negative   (Normal Range: Negative)          Last LDL:                                                 55 (01/19/2010 9:42:00 PM)            Diabetic Foot  Exam Foot Inspection Are the shoes appropriate in style and fit?          Yes Is there swelling or an abnormal foot shape?          Yes Are the toenails long?                Yes Are the toenails thick?                Yes Is there a claw toe deformity?                          Yes      Comments: PT WOULD BENEFIT FROM A PODIATRY REFERRAL   10-g (5.07) Semmes-Weinstein Monofilament Test Performed by: Michelle Nasuti          Right Foot          Left Foot Visual Inspection               Test Control      normal         normal Site 1         normal         normal Site 2         abnormal         abnormal Site 3         abnormal         normal Site 4         normal         abnormal Site 5         abnormal         abnormal Site 6         abnormal         abnormal Site 7         abnormal         abnormal Site 8         normal         abnormal Site 9         normal         normal Site 10         normal         normal  Impression      normal         normal

## 2010-12-26 NOTE — Assessment & Plan Note (Signed)
Summary: f/u with Dr Delrae Alfred in 4 months hypertension/ hyperglycemia/...   Vital Signs:  Patient profile:   60 year old male Weight:      260 pounds BMI:     38.53 Temp:     97.9 degrees F oral Pulse rate:   53 / minute Pulse rhythm:   regular Resp:     18 per minute BP sitting:   120 / 75  (left arm) Cuff size:   large  Vitals Entered By: [place] Shannon (May 17, 2010 8:53 AM)  CC: f/u Is Patient Diabetic? No Pain Assessment Patient in pain? no       Does patient need assistance? Functional Status Self care Ambulation Normal   CC:  f/u.  History of Present Illness: Pt. here for visit that apparently did not get cancelled following recent appt. for new diagnosis of DM 1.  DM:  working on diet and exercise.  Walking every day--1 1/2 miles every day.  Describes possibly polyuria and polydipsia.  Did not get a glucose check today--did eat this morning.  Felt Diabetic nutrition appt. was helpful.  Pt. to call if feels needs more follow up with them.  2.  Hypertension:  controlled.  3.  Chest pain:  notes when walks briskly.  Sometimes feels like someone sitting on chest.  Gets dyspneic with it at times.  Slows down or stops and sits-generally goes away in 10-15 minutes.  No radiation of pain.  Sometimes feels light headed with the discomfort.  No palpitations associated.  Has had this for 3-4 months.   Pt. with hx of nonobstuctive CAD dx via cardiac cath in 01/2007.  Dr Eldridge Dace.  Pt. was to follow up with Dr. Amil Amen, but did not.  4. Bradycardia:  pt. denies fatigue or lightheadedness without the chest discomfort.    Current Medications (verified): 1)  Labetalol Hcl 200 Mg Tabs (Labetalol Hcl) .Marland Kitchen.. 1 By Mouth Two Times A Day 2)  Clonidine Hcl 0.1 Mg Tabs (Clonidine Hcl) .... 2 Tab By Mouth Two Times A Day 3)  Lisinopril 10 Mg Tabs (Lisinopril) .Marland Kitchen.. 1 By Mouth Once Daily 4)  Lipitor 20 Mg Tabs (Atorvastatin Calcium) .Marland Kitchen.. 1 Tab By Mouth Daily 5)  Aspirin Ec 81 Mg Tbec  (Aspirin) .Marland Kitchen.. 1 Tab By Mouth Daily With A Meal 6)  Famotidine 40 Mg Tabs (Famotidine) .Marland Kitchen.. 1 Tab By Mouth At Bedtime  Allergies (verified): No Known Drug Allergies  Physical Exam  General:  NAD Lungs:  Normal respiratory effort, chest expands symmetrically. Lungs are clear to auscultation, no crackles or wheezes. Heart:  Normal rate and regular rhythm. S1 and S2 normal without gallop, murmur, click, rub or other extra sounds.  Radial pulses normal and equal.   Extremities:  No edema   Impression & Recommendations:  Problem # 1:  DIABETES MELLITUS, TYPE II, CONTROLLED (ICD-250.00)  His updated medication list for this problem includes:    Lisinopril 10 Mg Tabs (Lisinopril) .Marland Kitchen... 1 by mouth once daily    Aspirin Ec 81 Mg Tbec (Aspirin) .Marland Kitchen... 1 tab by mouth daily with a meal  Orders: Capillary Blood Glucose/CBG (82948) Hgb A1C (40981XB)  Problem # 2:  HYPERTENSION (ICD-401.9) Controlled His updated medication list for this problem includes:    Labetalol Hcl 200 Mg Tabs (Labetalol hcl) .Marland Kitchen... 1 by mouth two times a day    Clonidine Hcl 0.1 Mg Tabs (Clonidine hcl) .Marland Kitchen... 2 tab by mouth two times a day    Lisinopril 10 Mg Tabs (  Lisinopril) .Marland Kitchen... 1 by mouth once daily  Problem # 3:  CHEST PAIN (ICD-786.50)  Exertional. NTG  SL as needed   Orders: EKG w/ Interpretation (93000)  Sinus bradycardia, nonspecific T wave changes. Cardiology Referral (Cardiology) Diagnostic X-Ray/Fluoroscopy (Diagnostic X-Ray/Flu)  Problem # 4:  SINUS BRADYCARDIA (ICD-427.81) Secondary to Labetalol, but controlling bp at this dose and pt. asymptomatic--to call if develops symptoms of fatigues and presyncope His updated medication list for this problem includes:    Labetalol Hcl 200 Mg Tabs (Labetalol hcl) .Marland Kitchen... 1 by mouth two times a day    Aspirin Ec 81 Mg Tbec (Aspirin) .Marland Kitchen... 1 tab by mouth daily with a meal  Complete Medication List: 1)  Labetalol Hcl 200 Mg Tabs (Labetalol hcl) .Marland Kitchen.. 1 by mouth  two times a day 2)  Clonidine Hcl 0.1 Mg Tabs (Clonidine hcl) .... 2 tab by mouth two times a day 3)  Lisinopril 10 Mg Tabs (Lisinopril) .Marland Kitchen.. 1 by mouth once daily 4)  Lipitor 20 Mg Tabs (Atorvastatin calcium) .Marland Kitchen.. 1 tab by mouth daily 5)  Aspirin Ec 81 Mg Tbec (Aspirin) .Marland Kitchen.. 1 tab by mouth daily with a meal 6)  Famotidine 40 Mg Tabs (Famotidine) .Marland Kitchen.. 1 tab by mouth at bedtime 7)  Nitrostat 0.4 Mg Subl (Nitroglycerin) .Marland Kitchen.. 1 tab sublingual as needed chest pain.  may repeat every 5 minutes x2 if chest pain not relieved  Patient Instructions: 1)  Cancel CPE in July--please make for 10/11 2)  Go to Emergency Room if you have chest pain that is not relieved with nitroglycerin. 3)  Lie down when you take nitroglycerin. Prescriptions: NITROSTAT 0.4 MG SUBL (NITROGLYCERIN) 1 tab sublingual as needed chest pain.  May repeat every 5 minutes x2 if chest pain not relieved  #24 x 1   Entered and Authorized by:   Julieanne Manson MD   Signed by:   Julieanne Manson MD on 05/17/2010   Method used:   Faxed to ...       [facility] - [facility] (retail)       [address] [place], [place]  [address]       Ph: [phone]       Fax: [phone]   RxID:   [id number]

## 2010-12-26 NOTE — Progress Notes (Signed)
Summary: [facility]-info called to pt.  Phone Note Outgoing Call   Call placed by: Julieanne Manson MD,  December 26, 2007 9:59 AM Call placed to: Patient Details for Reason: eye referral Summary of Call: Called him phone number for [facility] to see if he qualifies--pt. needs to apply himself.  [phone] Initial call taken by: Julieanne Manson MD,  December 26, 2007 10:00 AM

## 2010-12-26 NOTE — Letter (Signed)
Summary: CASE MANAGER W/ PROJECT ACCESS  CASE MANAGER W/ PROJECT ACCESS   Imported By: Arta Bruce 12/19/2007 15:47:40  _____________________________________________________________________  External Attachment:    Type:   Image     Comment:   External Document

## 2010-12-26 NOTE — Letter (Signed)
Summary: [place] [facility]  [place] [facility]   Imported ByArta Bruce 10/25/2010 16:03:24  _____________________________________________________________________  External Attachment:    Type:   Image     Comment:   External Document

## 2010-12-26 NOTE — Progress Notes (Signed)
----   Converted from flag ---- ---- 01/06/2008 4:01 PM, Edward Stafford CMA wrote: Not able to schedule appt at [facility] for Optho. What would you like for [place] to do at this point. If nothing please complete referral. ------------------------------  Phone Note Other Incoming   Summary of Call: [facility] unable to schedule optho appt.  Please call and give him phone number for [facility]--[phone].  He will need to call and set up appt to see if qualifies for [facility].  If he is unable to get in--let us know and will see if can try going to [facility] or another local doc. Initial call taken by: Julieanne Manson MD,  January 09, 2008 1:29 PM  Follow-up for Phone Call        No answer [phone] Follow-up by: Vesta Mixer CMA,  January 12, 2008 11:42 AM  Additional Follow-up for Phone Call Additional follow up Details #1::        No answer [phone] Additional Follow-up by: Murvin Natal SMA,  January 13, 2008 3:50 PM    Additional Follow-up for Phone Call Additional follow up Details #2::    called a spoke with pts mother, she will give pt message to return call.   Follow-up by: Verdis Prime SMA,  January 15, 2008 12:15 PM  Additional Follow-up for Phone Call Additional follow up Details #3:: Details for Additional Follow-up Action Taken: spoke with patient, notified him that his script is ready at the [facility] pharmacy, pt states that he has an appoinment with Dr. Mitzi Davenport on March 4th about his eyes Additional Follow-up by: Verdis Prime SMA,  January 20, 2008 11:22 AM

## 2011-01-22 ENCOUNTER — Telehealth (INDEPENDENT_AMBULATORY_CARE_PROVIDER_SITE_OTHER): Payer: Self-pay | Admitting: Internal Medicine

## 2011-01-22 ENCOUNTER — Encounter (INDEPENDENT_AMBULATORY_CARE_PROVIDER_SITE_OTHER): Payer: Self-pay | Admitting: Internal Medicine

## 2011-02-01 NOTE — Miscellaneous (Signed)
Summary: Record Review:  Eye exam  Clinical Lists Changes  Observations: Added new observation of DIAB EYE EX: :  IOP low normal--continue to observe  HVF improved from previous --follow up in 6 months. (06/24/2008 9:44)

## 2011-02-06 NOTE — Letter (Signed)
Summary: [facility]FINAL RE[facility]  [facility]   Imported By: Arta Bruce 01/29/2011 09:05:29  _____________________________________________________________________  External Attachment:    Type:   Image     Comment:   External Document

## 2011-02-06 NOTE — Progress Notes (Signed)
Summary: Eye follow up  Phone Note Outgoing Call   Summary of Call: Nora--pt. was to follow up at [facility]  for his eyes in 6 months (end of 2009)--does not appear he did.  Will they still see him and will pt. go? Initial call taken by: Julieanne Manson MD,  January 22, 2011 9:50 AM  Follow-up for Phone Call        I call and I talk to Mr Wernette and he said that he didn't know that he need another appt and he is willing to go . I call [facility] and they give me an appt 02-19-11 @ 11;15am but he need to paid $80.00 so I call him and he said that he wont have the money so we agree to make an appt for May . His appt 04-02-11 @ 10:30am pt aware of his appt.Cheryll Dessert  January 29, 2011 12:34 PM  Follow-up by: Cheryll Dessert,  January 29, 2011 12:34 PM

## 2011-04-13 NOTE — Op Note (Signed)
[facility]  Patient:    Edward Stafford, Edward Stafford                   MRN: [id number] Proc. Date: 12/06/00 Attending:  Currie Paris, M.D. CC:         Sabino Gasser, M.D.   Operative Report  PREOPERATIVE DIAGNOSES:  Chronic calculus cholecystitis.  POSTOPERATIVE DIAGNOSES:  Chronic calculus cholecystitis.  OPERATION PERFORMED:  Laparoscopic cholecystectomy.  SURGEON:  Dr. Jamey Ripa.  ASSISTANT:  Dr. Earlene Plater.  ANESTHESIA:  General endotracheal.  CLINICAL HISTORY:  The patient is a 60 year old who presented with what appeared to be a history of passing a common duct stone with acute episode of pain in a band-like fashion around the upper abdomen going around into his back with some nausea and some elevated liver functions. Yesterday, he underwent ERCP which was negative and his pain had essentially resolved. His bilirubin was still elevated but his transaminases had begun coming down and in discussion with Dr. Virginia Rochester it was felt the clinical picture was more that of a passage of a common duct stone and not acute hepatitis. We therefore wanted to proceed to surgery.  DESCRIPTION OF PROCEDURE:  The patient was brought to the operating room and after satisfactory general endotracheal anesthesia had been obtained, the abdomen was prepped and draped. I used 0.25% Marcaine at each incision and made an umbilical incision first. The fascia was opened, the peritoneal cavity entered. A pursestring was placed and the Hasson introduced. A fair 360 degree view of the abdomen was undertaken and there appeared to be no gross abnormalities. The patient was placed in reverse Trendelenburg and tilted to the left and the remaining three cannulas placed in the usual positions. There was one epigastric and two lateral trocars.  The liver itself looked normal. The gallbladder looked chronically inflamed and had omental adhesions which were taken down. I began to suction  down around the cystic duct and tentatively identified what I thought was the cystic duct and was able to actually get around this but there was a lot of tissue going up into the triangle of Calot from this going towards the liver and I wanted to be sure we hadnt accidentally tinted up the common duct. I opened the peritoneum on the gallbladder on both sides, worked around and behind the gallbladder until I was completely the gallbladder well up on the gallbladder. In doing so, I dissected out the cystic artery, clipped it and divided it once I had that identified. At this point, I was well around the gallbladder above the triangle of Calot, worked back down into the triangle of Calot and found that basically what I had been looking at was a small vascular structure and a lot of chronic inflammatory fibrotic tissue but we got this all cleared off.  I put a clip on the cystic duct right at its junction with the gallbladder and opened it. I milked back a little bit of bile but there was no stone material in the cystic duct that was visible and three clips were placed on the stay side of the cystic duct and it was divided. The gallbladder was removed from below to above using coagulation current of the cautery. Just prior to disconnecting, we irrigated and made sure everything was dry. There was a little raw surface of the bed of the liver which I put some Surgicel in but it did appear to be dry prior to placing the Surgicel. The gallbladder was disconnected  and brought out the umbilical port. Final check of hemostasis was made and again everything appeared to be dry. Vital ports removed and didnt bleed. The umbilical port was closed with the camera visualizing this through the epigastric port. The abdomen was deflated through the epigastric port. The skin was closed with 4-0 Monocryl subcuticular plus Steri-Strips. The patient tolerated the procedure well. There were operative complications.  All counts were correct. DD:  12/06/00 TD:  12/07/00 Job: [id number]

## 2011-04-13 NOTE — Discharge Summary (Signed)
[facility]  Patient:    BERGEN, MAGNER                   MRN: [id number] Adm. Date:  [id number] Disc. Date: [id number] Attending:  Sabino Gasser CC:         Currie Paris, M.D.   Discharge Summary  HISTORY OF PRESENT ILLNESS:  Mr. Snelson is a 60 year old gentleman with previous history of GERD symptoms who presented with new upper abdominal pain with radiation to his back.  He got no relief with his usual measures that treat his GERD, so he came to the emergency department.  He had relief with pain medications that were given.  He had no diarrhea, melena, or hematochezia.  He vomited x 2 the food that he had eaten.  SOCIAL HISTORY:  He gave a positive history of tobacco and alcohol use.  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  MEDICATIONS ON ADMISSION:  Takes no medications.  ALLERGIES:  No allergies to medication.  PHYSICAL EXAMINATION:  VITAL SIGNS:  On admission he was afebrile.  Vital signs were stable.  ABDOMEN:  Examination by my exam was unremarkable.  There was no bruit heard. Abdomen was benign.  LUNGS:  Clear.  CARDIOVASCULAR:  Heart was regular rhythm.  LABORATORY AND X-RAY DATA:  Initial findings showed elevated transaminase levels with ultrasound showing gallstones and a dilated common bile duct. Specifically, his labs were:  AST was 322, ALT 235, alk phos was normal, total bilirubin 2.4.  On the next hospital day his total bilirubin was 7.9 with alkaline phosphatase at 153, ALT was 261, and AST was 130.  His white count on admission was 7500 with a hemoglobin of 15.4; normal prothrombin time, normal amylase, and normal lipase was noted.  Subsequently, a hepatitis screen was done, and the studies were entirely negative.  Other laboratory studies of note:  EKG showed sinus bradycardia, left ventricular hypertrophy, nonspecific ST abnormality.  HOSPITAL COURSE:  On the second  hospital day, the patient underwent ERCP because of the clinical suspicion of the passage of a common bile duct stone, and ERCP findings were negative except for numerous gallstones in the gallbladder.  On the subsequent hospital day he underwent laparoscopic cholecystectomy with findings of chronic calculous cholecystitis, and the patient did well and was discharged on the following day home.  At that time his LFTs had trended significant downward.  The AST at the time of discharge had come down to 56 (normal range), ALT 133, alk phos 156, bilirubin which had peaked at 7.9 had come down to 2.5.  The patient did clinically well and was discharged home for follow-up by the surgical department. DD:  12/25/00 TD:  12/25/00 Job: [id number]

## 2011-04-13 NOTE — Consult Note (Signed)
[facility]  Patient:    Edward Stafford, Edward Stafford                   MRN: [id number] Proc. Date: 12/04/00 Adm. Date:  [id number] Attending:  Sabino Gasser CC:         Sabino Gasser, M.D.   Consultation Report  REASON FOR CONSULTATION:  Gallstones.  CLINICAL HISTORY:  Mr. Kuenzel is a 60 year old gentleman who has presented with upper abdominal pain which radiates in a bandlike fashion across the subcostal area, radiates into his back associated with some nausea and was fairly severe.  He took some of his usual antireflux medications and got no relief and presented to the emergency room.  He had no other GI symptoms. Currently, no diarrhea or melena.  SOCIAL HISTORY:  Patient is a smoker.  Uses occasional alcohol.  PAST SURGICAL HISTORY:  None.  FAMILY HISTORY:  The patient has a history of coronary artery disease.  REVIEW OF SYSTEMS:  Basically negative.  PHYSICAL EXAMINATION:  GENERAL:  Patient is alert and oriented at the present time.  HEENT:  Head is normocephalic.  Eyes appear non-icteric.  NECK:  Supple.  No masses or thyromegaly.  LUNGS:  Clear to auscultation.  HEART:  Regular.  No murmurs, rubs, or gallops.  ABDOMEN:  Completely nontender.  LABORATORIES:  Elevated transaminases with a slightly elevated total bilirubin.  I reviewed the gallbladder ultrasound.  He has multiple gallstones and a dilated common duct.  IMPRESSION:  Common duct stone with cholelithiasis.  May have passed it.  PLAN:  He is already scheduled tomorrow for an ERCP.  He will need laparoscopic cholecystectomy after that.  I have discussed that with the patient and gone over the indications, risks, and complications for surgery. He agrees and we will plan to do him on Friday after ERCP on Thursday.  I will see him again tomorrow to again review the situation with him and be sure there are no changes that need to be made depending on the ERCP findings. DD:   12/04/00 TD:  12/04/00 Job: [id number]

## 2011-04-13 NOTE — Discharge Summary (Signed)
Edward Stafford, [name]          ACCOUNT NO.:  [id number]   MEDICAL RECORD NO.:  [id number]          PATIENT TYPE:  OBV   LOCATION:  2028                         FACILITY:  [facility]   PHYSICIAN:  Corky Crafts, MDDATE OF BIRTH:  08-26-1951   DATE OF ADMISSION:  02/02/2007  DATE OF DISCHARGE:  02/05/2007                               DISCHARGE SUMMARY   DISCHARGE DIAGNOSES:  1. Hypertensive urgency, normalized blood pressure.  2. Chest pain, resolved.  3. Abnormal false Cardiolite, positive with catheterization showing      nonobstructive coronary artery disease, as well as no renal artery      stenosis.  4. Multisubstance abuse.   [name] is a 60 year old male patient who was admitted to [facility] on February 02, 2007, with chest pain, as well as hypertensive  urgency with a systolic blood pressure of 225/115.  In addition, he had  chest pain and initially went to [facility] for medical  help.  He was given a nitroglycerin there, and he was then given 10,000  units of heparin, and then was transported to [facility] emergently.  An  EKG at [facility] suggested an anterior ongoing  infarction.  Once he arrived at [facility], he was  asymptomatic.  Once he arrived at [facility], a code semi had  already been called, but this was canceled.   He was admitted, and he was continued on oral agents for his blood  pressure problems.  He then underwent a stress test that showed a  reversible inferolateral wall defect.  In addition, a 2D echo showed the  EF to be about 65% with no wall motion abnormalities.   Because of his abnormal Cardiolite, we then performed a cardiac  catheterization and no significant coronary artery stenosis, no renal  artery stenosis.  No LV gram was performed because of his chronic renal  insufficiency.  Creatinine was 1.8 prior to catheterization.   The patient remained in the hospital overnight and  was discharged to  home.  His labs upon discharge were hemoglobin of 13.7, hematocrit 40.8,  platelets 210, white count 4.9.  BUN 8, creatinine 1.5.  He was  discharged to home in stable and improved condition.  His discharge  medications:  1. Labetalol 200 mg b.i.d.  2. Catapres 0.1 mg 2 tablets twice a day  3. Baby aspirin daily   He was counseled on stopping illicit drugs.  He was educated on no  alcohol or tobacco use.  We have utilized our case management in helping  this man securing an appointment with [facility] for blood pressure  following/control.  He is to clean his catheterization site gently with  soap and water.  Increase activity slowly.  No lifting over 10 pounds  for 1 week.  No driving for 2 days.  He is to call for any further  questions or concerns.      Guy Franco, P.A.      Corky Crafts, MD  Electronically Signed    LB/MEDQ  D:  04/14/2007  T:  04/14/2007  Job:  [id number]  cc:   [facility]

## 2011-04-13 NOTE — H&P (Signed)
Edward Stafford, Edward Stafford          ACCOUNT NO.:  [id number]   MEDICAL RECORD NO.:  [id number]          PATIENT TYPE:  INP   LOCATION:  2028                         FACILITY:  [facility]   PHYSICIAN:  Francisca December, M.D.  DATE OF BIRTH:  July 12, 1951   DATE OF ADMISSION:  02/02/2007  DATE OF DISCHARGE:                              HISTORY & PHYSICAL   REASON FOR ADMISSION:  Chest pain, hypertensive urgency.   HISTORY OF PRESENT ILLNESS:  Edward Stafford is a 60 year old man without  significant previous cardiac history who experienced some mild left  chest tightness today.  This has happened in the past.  This occurs both  with and without exertion.  He does not note that it radiates.  It does  not get into the left arm.  He has not had associated nausea or  diaphoresis.  He has not been short of breath.  His brother came to the  house, took his blood pressure, found it to be very elevated, and they  transported him to [facility].  There his blood pressure  was noted to be 222/115.  They administered nitroglycerin, performed an  EKG which suggested an anterior ongoing infarction.  They gave him  10,000 units of heparin and he was transported to the emergently to [facility].  The patient's chest discomfort had resolved within 5-10  minutes of its onset this morning.  He has not felt it since.  Upon  arrival to [facility] Emergency Room, he is asymptomatic.   In the Emergency Room, he was given 20 mg of labetalol.  En route to the  [facility] he was given 5 mg of Lopressor.  He also received 0.2 mg  of clonidine in the Emergency Room.  His blood pressure fell from  237/120 to now 113/67.  Again, he did not complain of chest discomfort  at all.  A code STEMI was called but this was cancelled by myself.   PAST MEDICAL HISTORY:  Intermittent hypertension, otherwise negative.   PAST SURGICAL HISTORY:  None.   SOCIAL HISTORY:  This gentleman is unemployed.  I believe he lives  with  his mother.  He uses alcohol, tobacco, and cocaine.  The last cocaine  use was two days ago.  He drinks beer frequently.  He smokes about a  pack of cigarettes a day.   CURRENT MEDICATIONS:  None.   DRUG ALLERGIES:  None.   REVIEW OF SYSTEMS:  Otherwise negative.   PHYSICAL EXAMINATION:  VITAL SIGNS:  The blood pressure initially as  mentioned above, currently 115/67.  Pulse is 55 and regular.  Respirations are 18.  Temperature afebrile.  GENERAL:  This is a well-appearing 60 year old mildly obese African-  American male.  HEENT:  Unremarkable.  NECK:  Supple without thyromegaly or masses.  The carotid upstrokes are  normal.  There is no bruit.  There is no JVD.  CHEST:  The chest is clear with adequate excursion.  HEART:  The heart has regular rhythm.  Normal S1 and S2 were heard.  No  murmur, click, or rub.  No gallop.  ABDOMEN:  Obese,  soft, nontender.  No midline pulsatile mass.  Breath  sounds present in all quadrants.  GENITALIA:  Normal male phallus, descended testicles.  No lesions.  EXTREMITIES:  Show full range of motion.  No edema.  Intact distal  pulses.  NEUROLOGIC:  Cranial nerves II-XII are intact.  Motor and sensory  grossly intact.  Gait not tested.  SKIN: Warm, dry and clear.   ACCESSORY CLINICAL DATA:  Initial cardiac point of care enzymes are  negative.  Other laboratory at this point are pending.   Electrocardiogram shows left ventricular hypertrophy with repolarization  abnormality.  Diffuse T wave abnormality is present.  Chest x-ray  pending.   IMPRESSION:  1. Hypertensive urgency.  2. Chest discomfort associated with above.  3. Abnormal EKG secondary to left ventricular hypertrophy.  4. Multisubstance abuse.   PLAN:  1. The patient is admitted to rule out an occult myocardial infarction      and for hypertension control.  2. Will continue clonidine and labetalol p.o.  3. 2D echocardiogram.  4. Substance abuse counseling will be  sought.      Francisca December, M.D.  Electronically Signed     JHE/MEDQ  D:  02/02/2007  T:  02/03/2007  Job:  [id number]

## 2011-04-13 NOTE — Cardiovascular Report (Signed)
Edward Stafford, Edward Stafford          ACCOUNT NO.:  [id number]   MEDICAL RECORD NO.:  [id number]          PATIENT TYPE:  OBV   LOCATION:  2028                         FACILITY:  [facility]   PHYSICIAN:  Corky Crafts, MDDATE OF BIRTH:  November 14, 1951   DATE OF PROCEDURE:  DATE OF DISCHARGE:  02/05/2007                            CARDIAC CATHETERIZATION   PROCEDURES PERFORMED:  Left heart catheterization, coronary angiogram,  abdominal aortogram.   OPERATOR:  Dr. Eldridge Dace.   INDICATIONS:  Abnormal stress test, malignant hypertension.   PROCEDURAL NARRATIVE:  The risks and benefits of cardiac catheterization  were explained to the patient, and informed consent was obtained.  The  patient was brought to the cath lab.  He was prepped and draped in the  usual sterile fashion.  His right groin was infiltrated with 1%  lidocaine.  A 6-French arterial sheath was placed into the right femoral  artery, using the modified Seldinger technique.  Left coronary artery  angiography was performed using a JL-4.0 catheter.  The catheter was  advanced to the vessel ostium under fluoroscopic guidance.  Digital  angiography was performed in multiple projections, using hand injection  of contrast.  Right right coronary artery angiography was then performed  using a JR-4.5 catheter.  The catheter was advanced to the vessel ostium  under fluoroscopic guidance.  Digital angiography was performed in  multiple projections, using hand injection of contrast.  Pigtail  catheter was advanced to the aortic valve and across under fluoroscopic  guidance.  Hemodynamic pressures were obtained.  A ventriculogram was  not performed because of renal insufficiency.  Catheter was pulled back  under continuous hemodynamic pressure.  Monitoring catheter was pulled  back to the level of the renal arteries, and abdominal aortogram was  performed in the AP projection, using power injection of contrast.  A  Star Close was deployed  for hemostasis, for patient comfort and because  his blood pressure had been such an issue.   FINDINGS:  The left main is a large vessel and is widely patent.  The  left circumflex is a large vessel with minor irregularities.  It is co-  dominant.  The OM1 and OM2 are small.  The OM3 is a medium-sized vessel  with minor irregularities.  The OM4 is medium-sized branching vessel,  minor irregularities.  The left anterior descending is a large vessel.  The D1 is a large vessel with minor irregularities.  In the mid LAD,  there is a 30% lesion.  The right coronary artery is a co-dominant  vessel.  There is a 37 mid-vessel stenosis and other minor  irregularities.  PDA has mild irregularities the abdominal aortogram  shows single renal arteries bilaterally.  There is no abdominal aortic  aneurysm.  The renal arteries are widely patent bilaterally.   HEMODYNAMIC RESULTS:  Left ventricular pressure is 125/0 with an LVEDP  of 11 mmHg, aortic pressure 120/63, a mean aortic pressure of 84.  At  the start of the case, the patient's intra-aortic pressure was around  180 systolic.  She received intravenous hydralazine, along with usual  sedation which helped bring his blood pressure  down.   IMPRESSION:  1. Mild coronary artery disease.  There are no hemodynamically      significant coronary artery lesions.  2. Normal hemodynamics with an LVEDP of 11 mmHg.  No left      ventriculogram was performed, because of chronic renal      insufficiency.  3. No renal artery stenosis.  No abdominal aortic aneurysm.   RECOMMENDATIONS:  Continue aggressive medical therapy for blood  pressure.  The patient should also avoid tobacco and cocaine.  He will  follow up with Dr. Amil Amen.      Corky Crafts, MD  Electronically Signed     JSV/MEDQ  D:  02/04/2007  T:  02/06/2007  Job:  [id number]

## 2011-04-13 NOTE — Procedures (Signed)
[facility]  Patient:    Edward Stafford, Edward Stafford                   MRN: [id number] Attending:  Sabino Gasser, M.D. CC:         Currie Paris, M.D.   Procedure Report  PROCEDURE:  Endoscopic retrograde cholangiopancreatography.  INDICATION:  Rule out common bile duct stones.  ANESTHESIA:  Demerol 65 mg, Versed 6.5 mg.  DESCRIPTION OF PROCEDURE:  With patient mildly sedated in room 7 of radiology in the prone position, the Olympus side-viewing duodenoscope was inserted in the mouth, passed through the esophagus, stomach, into the duodenum, where we shortened the endoscope and the ampulla of Vater became well-visualized.  It was photographed and appeared normal.  Subsequently the Wilson-Cook Tritome catheter was advanced through the endoscope and advanced into the os and with first pass, we injected and encountered a deep cannulation of the common bile duct.  An injection was made easily.  Contrast was seen to fill the biliary system as best possible, and no filling defects were seen in the common bile duct or the cystic duct.  Multiple stones were seen in the gallbladder, and the remainder of the visualized biliary tree appeared otherwise normal.  The endoscope was withdrawn after the catheter was pulled back into the scope, and post-filling films showed again no filling defects.  The patients vital signs and pulse oximetry remained stable.  The patient tolerated the procedure well without apparent complications.  FINDINGS:  Stones in the gallbladder but no other biliary stones or filling defects seen.  PLAN:  Laparoscopic cholecystectomy. DD:  12/05/00 TD:  12/06/00 Job: [id number]

## 2011-12-16 ENCOUNTER — Encounter (HOSPITAL_COMMUNITY): Payer: Self-pay | Admitting: *Deleted

## 2011-12-16 ENCOUNTER — Emergency Department (HOSPITAL_COMMUNITY): Payer: No Typology Code available for payment source

## 2011-12-16 ENCOUNTER — Emergency Department (HOSPITAL_COMMUNITY)
Admission: EM | Admit: 2011-12-16 | Discharge: 2011-12-16 | Disposition: A | Discharge status: Home / Self Care | Payer: No Typology Code available for payment source | Attending: Emergency Medicine | Admitting: Emergency Medicine

## 2011-12-16 DIAGNOSIS — E785 Hyperlipidemia, unspecified: Secondary | ICD-10-CM | POA: Insufficient documentation

## 2011-12-16 DIAGNOSIS — Y92410 Unspecified street and highway as the place of occurrence of the external cause: Secondary | ICD-10-CM | POA: Insufficient documentation

## 2011-12-16 DIAGNOSIS — G8929 Other chronic pain: Secondary | ICD-10-CM | POA: Insufficient documentation

## 2011-12-16 DIAGNOSIS — M549 Dorsalgia, unspecified: Secondary | ICD-10-CM | POA: Insufficient documentation

## 2011-12-16 DIAGNOSIS — M542 Cervicalgia: Secondary | ICD-10-CM | POA: Insufficient documentation

## 2011-12-16 DIAGNOSIS — I10 Essential (primary) hypertension: Secondary | ICD-10-CM | POA: Insufficient documentation

## 2011-12-16 HISTORY — DX: Essential (primary) hypertension: I10

## 2011-12-16 HISTORY — DX: Hyperlipidemia, unspecified: E78.5

## 2011-12-16 HISTORY — DX: Other chronic pain: G89.29

## 2011-12-16 HISTORY — DX: Dorsalgia, unspecified: M54.9

## 2011-12-16 MED ORDER — HYDROCODONE-ACETAMINOPHEN 5-325 MG PO TABS
1.0000 | ORAL_TABLET | Freq: Once | ORAL | Status: AC
  Administered 2011-12-16: 1 via ORAL
  Filled 2011-12-16: qty 1

## 2011-12-16 MED ORDER — IBUPROFEN 600 MG PO TABS: 600.0000 mg | ORAL_TABLET | Freq: Once | ORAL | Status: DC

## 2011-12-16 MED ORDER — IBUPROFEN 600 MG PO TABS: 600.0000 mg | ORAL_TABLET | Freq: Once | ORAL | Status: AC

## 2011-12-16 MED ORDER — IBUPROFEN 200 MG PO TABS
600.0000 mg | ORAL_TABLET | Freq: Once | ORAL | Status: AC
  Administered 2011-12-16: 600 mg via ORAL
  Filled 2011-12-16: qty 3

## 2011-12-16 MED ORDER — DIAZEPAM 5 MG PO TABS
5.0000 mg | ORAL_TABLET | Freq: Once | ORAL | Status: AC
  Administered 2011-12-16: 5 mg via ORAL
  Filled 2011-12-16: qty 1

## 2011-12-16 MED ORDER — DIAZEPAM 5 MG PO TABS: 5.0000 mg | ORAL_TABLET | Freq: Once | ORAL | Status: AC

## 2011-12-16 MED ORDER — HYDROCODONE-ACETAMINOPHEN 5-325 MG PO TABS: 1.0000 | ORAL_TABLET | Freq: Once | ORAL | Status: AC

## 2011-12-16 NOTE — ED Provider Notes (Signed)
Medical screening examination/treatment/procedure(s) were performed by non-physician practitioner and as supervising physician I was immediately available for consultation/collaboration.   Ellyson Rarick, MD 12/16/11 1514 
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
## 2012-04-16 ENCOUNTER — Emergency Department (HOSPITAL_COMMUNITY): Payer: Medicare Other

## 2012-04-16 ENCOUNTER — Observation Stay (HOSPITAL_COMMUNITY)
Admission: EM | Admit: 2012-04-16 | Discharge: 2012-04-18 | Disposition: A | Discharge status: Home / Self Care | Payer: Medicare Other | Attending: Internal Medicine | Admitting: Internal Medicine

## 2012-04-16 ENCOUNTER — Encounter (HOSPITAL_COMMUNITY): Payer: Self-pay | Admitting: *Deleted

## 2012-04-16 DIAGNOSIS — R42 Dizziness and giddiness: Secondary | ICD-10-CM | POA: Diagnosis present

## 2012-04-16 DIAGNOSIS — I129 Hypertensive chronic kidney disease with stage 1 through stage 4 chronic kidney disease, or unspecified chronic kidney disease: Secondary | ICD-10-CM | POA: Insufficient documentation

## 2012-04-16 DIAGNOSIS — E118 Type 2 diabetes mellitus with unspecified complications: Secondary | ICD-10-CM

## 2012-04-16 DIAGNOSIS — R001 Bradycardia, unspecified: Secondary | ICD-10-CM

## 2012-04-16 DIAGNOSIS — I10 Essential (primary) hypertension: Secondary | ICD-10-CM

## 2012-04-16 DIAGNOSIS — I2510 Atherosclerotic heart disease of native coronary artery without angina pectoris: Secondary | ICD-10-CM | POA: Insufficient documentation

## 2012-04-16 DIAGNOSIS — I495 Sick sinus syndrome: Secondary | ICD-10-CM

## 2012-04-16 DIAGNOSIS — R739 Hyperglycemia, unspecified: Secondary | ICD-10-CM | POA: Diagnosis present

## 2012-04-16 DIAGNOSIS — N189 Chronic kidney disease, unspecified: Secondary | ICD-10-CM | POA: Insufficient documentation

## 2012-04-16 DIAGNOSIS — I498 Other specified cardiac arrhythmias: Principal | ICD-10-CM | POA: Insufficient documentation

## 2012-04-16 DIAGNOSIS — F1410 Cocaine abuse, uncomplicated: Secondary | ICD-10-CM | POA: Insufficient documentation

## 2012-04-16 DIAGNOSIS — E871 Hypo-osmolality and hyponatremia: Secondary | ICD-10-CM | POA: Insufficient documentation

## 2012-04-16 DIAGNOSIS — E1165 Type 2 diabetes mellitus with hyperglycemia: Secondary | ICD-10-CM

## 2012-04-16 DIAGNOSIS — E119 Type 2 diabetes mellitus without complications: Secondary | ICD-10-CM | POA: Insufficient documentation

## 2012-04-16 DIAGNOSIS — N183 Chronic kidney disease, stage 3 unspecified: Secondary | ICD-10-CM | POA: Diagnosis present

## 2012-04-16 DIAGNOSIS — F17200 Nicotine dependence, unspecified, uncomplicated: Secondary | ICD-10-CM | POA: Insufficient documentation

## 2012-04-16 HISTORY — DX: Chronic kidney disease, unspecified: N18.9

## 2012-04-16 LAB — COMPREHENSIVE METABOLIC PANEL
ALT: 13 U/L (ref 0–53)
AST: 18 U/L (ref 0–37)
Albumin: 3.8 g/dL (ref 3.5–5.2)
Alkaline Phosphatase: 76 U/L (ref 39–117)
BUN: 8 mg/dL (ref 6–23)
CO2: 26 mEq/L (ref 19–32)
Calcium: 9 mg/dL (ref 8.4–10.5)
Chloride: 100 mEq/L (ref 96–112)
Creatinine, Ser: 1.71 mg/dL — ABNORMAL HIGH (ref 0.50–1.35)
GFR calc Af Amer: 48 mL/min — ABNORMAL LOW (ref >90)
GFR calc non Af Amer: 42 mL/min — ABNORMAL LOW (ref >90)
Glucose, Bld: 96 mg/dL (ref 70–99)
Potassium: 3.8 mEq/L (ref 3.5–5.1)
Sodium: 134 mEq/L — ABNORMAL LOW (ref 135–145)
Total Bilirubin: 0.5 mg/dL (ref 0.3–1.2)
Total Protein: 7.1 g/dL (ref 6.0–8.3)

## 2012-04-16 LAB — DIFFERENTIAL
Basophils Absolute: 0 10*3/uL (ref 0.0–0.1)
Basophils Relative: 1 % (ref 0–1)
Eosinophils Absolute: 0.1 10*3/uL (ref 0.0–0.7)
Eosinophils Relative: 2 % (ref 0–5)
Lymphocytes Relative: 41 % (ref 12–46)
Lymphs Abs: 1.9 10*3/uL (ref 0.7–4.0)
Monocytes Absolute: 0.4 10*3/uL (ref 0.1–1.0)
Monocytes Relative: 9 % (ref 3–12)
Neutro Abs: 2.2 10*3/uL (ref 1.7–7.7)
Neutrophils Relative %: 48 % (ref 43–77)

## 2012-04-16 LAB — CBC
HCT: 41.7 % (ref 39.0–52.0)
Hemoglobin: 14.4 g/dL (ref 13.0–17.0)
MCH: 29.3 pg (ref 26.0–34.0)
MCHC: 34.5 g/dL (ref 30.0–36.0)
MCV: 84.8 fL (ref 78.0–100.0)
Platelets: 259 10*3/uL (ref 150–400)
RBC: 4.92 MIL/uL (ref 4.22–5.81)
RDW: 13.6 % (ref 11.5–15.5)
WBC: 4.7 10*3/uL (ref 4.0–10.5)

## 2012-04-16 LAB — CARDIAC PANEL(CRET KIN+CKTOT+MB+TROPI)
CK, MB: 3.5 ng/mL (ref 0.3–4.0)
Relative Index: 1.3 (ref 0.0–2.5)
Total CK: 270 U/L — ABNORMAL HIGH (ref 7–232)
Troponin I: <0.3 ng/mL (ref <0.30)

## 2012-04-16 LAB — URINALYSIS, ROUTINE W REFLEX MICROSCOPIC
Bilirubin Urine: NEGATIVE
Glucose, UA: NEGATIVE mg/dL
Hgb urine dipstick: NEGATIVE
Ketones, ur: NEGATIVE mg/dL
Leukocytes, UA: NEGATIVE
Nitrite: NEGATIVE
Protein, ur: NEGATIVE mg/dL
Specific Gravity, Urine: 1.01 (ref 1.005–1.030)
Urobilinogen, UA: 0.2 mg/dL (ref 0.0–1.0)
pH: 6.5 (ref 5.0–8.0)

## 2012-04-16 LAB — ETHANOL: Alcohol, Ethyl (B): <11 mg/dL (ref 0–11)

## 2012-04-16 MED ORDER — ACETAMINOPHEN 325 MG PO TABS: 650.0000 mg | ORAL_TABLET | Freq: Four times a day (QID) | ORAL | Status: DC | PRN

## 2012-04-16 MED ORDER — HYDROCODONE-ACETAMINOPHEN 5-325 MG PO TABS: 1.0000 | ORAL_TABLET | ORAL | Status: DC | PRN

## 2012-04-16 MED ORDER — ONDANSETRON HCL 4 MG/2ML IJ SOLN: 4.0000 mg | Freq: Four times a day (QID) | INTRAMUSCULAR | Status: DC | PRN

## 2012-04-16 MED ORDER — SODIUM CHLORIDE 0.9 % IJ SOLN
3.0000 mL | Freq: Two times a day (BID) | INTRAMUSCULAR | Status: DC
  Administered 2012-04-16 – 2012-04-18 (×4): 3 mL via INTRAVENOUS

## 2012-04-16 MED ORDER — HYDRALAZINE HCL 25 MG PO TABS
25.0000 mg | ORAL_TABLET | Freq: Four times a day (QID) | ORAL | Status: DC
  Administered 2012-04-16 – 2012-04-17 (×2): 25 mg via ORAL
  Filled 2012-04-16 (×6): qty 1

## 2012-04-16 MED ORDER — SODIUM CHLORIDE 0.9 % IV BOLUS (SEPSIS)
1000.0000 mL | Freq: Once | INTRAVENOUS | Status: AC
  Administered 2012-04-16: 1000 mL via INTRAVENOUS

## 2012-04-16 MED ORDER — ONDANSETRON HCL 4 MG PO TABS: 4.0000 mg | ORAL_TABLET | Freq: Four times a day (QID) | ORAL | Status: DC | PRN

## 2012-04-16 MED ORDER — PNEUMOCOCCAL VAC POLYVALENT 25 MCG/0.5ML IJ INJ: 0.5000 mL | INJECTION | INTRAMUSCULAR | Status: DC

## 2012-04-16 MED ORDER — ASPIRIN EC 81 MG PO TBEC
81.0000 mg | DELAYED_RELEASE_TABLET | Freq: Every day | ORAL | Status: DC
  Administered 2012-04-16 – 2012-04-18 (×3): 81 mg via ORAL
  Filled 2012-04-16 (×4): qty 1

## 2012-04-16 MED ORDER — LISINOPRIL 20 MG PO TABS
20.0000 mg | ORAL_TABLET | Freq: Every day | ORAL | Status: DC
  Administered 2012-04-17 – 2012-04-18 (×3): 20 mg via ORAL
  Filled 2012-04-16 (×4): qty 1

## 2012-04-16 MED ORDER — ENOXAPARIN SODIUM 40 MG/0.4ML ~~LOC~~ SOLN
40.0000 mg | SUBCUTANEOUS | Status: DC
  Administered 2012-04-16 – 2012-04-17 (×2): 40 mg via SUBCUTANEOUS
  Filled 2012-04-16 (×4): qty 0.4

## 2012-04-16 MED ORDER — ACETAMINOPHEN 650 MG RE SUPP: 650.0000 mg | Freq: Four times a day (QID) | RECTAL | Status: DC | PRN

## 2012-04-16 MED ORDER — LISINOPRIL 10 MG PO TABS
10.0000 mg | ORAL_TABLET | Freq: Every day | ORAL | Status: DC
  Filled 2012-04-16: qty 1

## 2012-04-16 MED ORDER — SODIUM CHLORIDE 0.9 % IV SOLN
INTRAVENOUS | Status: DC
  Administered 2012-04-16: 20:00:00 via INTRAVENOUS

## 2012-04-16 NOTE — ED Notes (Signed)
Pt reports dizziness and HTN which started x 3 days ago.  Pt reports he ran out of his BP meds x 3 days, but started taking them again last night.  Pt reports not being able to stand up and ambulated d/t dizziness.

## 2012-04-16 NOTE — H&P (Signed)
Patient's PCP: Julieanne Manson, MD, MD  Chief Complaint: dizziness  History of Present Illness: Edward Stafford is a 61 y.o. AA male who is a poor historian.  Has a 1 1/2day history of dizziness.  Says he has been off his BP meds for 3 days but was able to get them filled yesterday and took 2 labetalol (more than his normal dose).  He also used crack cocaine in the last week.   No CP, No SOB  Says he is seeing a new doctor but does not know her name.  Also says he had a stress test 1 year ago but does not remember where,  In the ER his HR was found to be low- he is dizzy, not at rest but when he get up.     Meds: Scheduled Meds:   . sodium chloride  1,000 mL Intravenous Once  . DISCONTD: pneumococcal 23 valent vaccine  0.5 mL Intramuscular Tomorrow-1000   Continuous Infusions:   . sodium chloride 125 mL/hr at 04/16/12 2018   PRN Meds:. Allergies: Review of patient's allergies indicates no known allergies. Past Medical History  Diagnosis Date  . Hypertension   . Back pain, chronic     disabled due to chronic back pain  . Heart abnormality     unknown heart history  . Hyperlipidemia    Past Surgical History  Procedure Date  . Cholecystectomy    History reviewed. No pertinent family history. History   Social History  . Marital Status: Single    Spouse Name: N/A    Number of Children: N/A  . Years of Education: N/A   Occupational History  . Not on file.   Social History Main Topics  . Smoking status: Former Smoker -- 5 years    Quit date: 04/17/2011  . Smokeless tobacco: Never Used  . Alcohol Use: 0.6 oz/week    1 Cans of beer per week     occasionally  . Drug Use: Yes    Special: "Crack" cocaine, Marijuana  . Sexually Active:    Other Topics Concern  . Not on file   Social History Narrative  . No narrative on file   Review of Systems: All systems reviewed with the patient and positive as per history of present illness, otherwise all other  systems are negative.   Physical Exam: Blood pressure 175/108, pulse 45, temperature 98.4 F (36.9 C), temperature source Oral, resp. rate 17, SpO2 98.00%. General: Awake, Oriented x3, No acute distress. HEENT: EOMI, Moist mucous membranes Neck: Supple CV: S1 and S2, rrr Lungs: Clear to ascultation bilaterally, no wheezing Abdomen: Soft, Nontender, Nondistended, +bowel sounds. Ext: Good pulses. Trace edema. No clubbing or cyanosis noted. Neuro: Cranial Nerves II-XII grossly intact. Has 5/5 motor strength in upper and lower extremities.    Lab results:  [facility] 04/16/12 1705  NA 134*  K 3.8  CL 100  CO2 26  GLUCOSE 96  BUN 8  CREATININE 1.71*  CALCIUM 9.0  MG --  PHOS --    Basename 04/16/12 1705  AST 18  ALT 13  ALKPHOS 76  BILITOT 0.5  PROT 7.1  ALBUMIN 3.8   No results found for this basename: LIPASE:2,AMYLASE:2 in the last 72 hours  Basename 04/16/12 1705  WBC 4.7  NEUTROABS 2.2  HGB 14.4  HCT 41.7  MCV 84.8  PLT 259   No results found for this basename: CKTOTAL:3,CKMB:3,CKMBINDEX:3,TROPONINI:3 in the last 72 hours No components found with this basename: POCBNP:3 No results found for  this basename: DDIMER in the last 72 hours No results found for this basename: HGBA1C:2 in the last 72 hours No results found for this basename: CHOL:2,HDL:2,LDLCALC:2,TRIG:2,CHOLHDL:2,LDLDIRECT:2 in the last 72 hours No results found for this basename: TSH,T4TOTAL,FREET3,T3FREE,THYROIDAB in the last 72 hours No results found for this basename: VITAMINB12:2,FOLATE:2,FERRITIN:2,TIBC:2,IRON:2,RETICCTPCT:2 in the last 72 hours Imaging results:  Dg Chest 2 View  04/16/2012  *RADIOLOGY REPORT*  Clinical Data: Dizziness.  Hypertension.  CHEST - 2 VIEW  Comparison: 05/23/2010.  Findings: Trachea is midline.  Heart size is accentuated by AP technique and somewhat low lung volumes.  Minimal bibasilar atelectasis.  Lungs are otherwise clear.  No pleural fluid.  IMPRESSION: Low lung  volumes with minimal bibasilar atelectasis.  Original Report Authenticated By: Reyes Ivan, M.D.   Other results: EKG: sinus brady  Assessment & Plan by Problem: Principal Problem:  *Bradycardia- ?related to too much labetalol, old records show he has a history of bradycardia but does not clarify number, check echo , CE   DIABETES MELLITUS, TYPE II, CONTROLLED- HgA1C- trend   TOBACCO ABUSE- stopped  Cocaine abuse- avoid BB   HYPERTENSION- hold rate lowering medications labetalol and clonidine- add hydralazine, increase lisinopril   CORONARY ARTERY DISEASE- CE, check FLP   Dizziness- monitor- ?related to low HR, PT eval  CKD- seems to be at baseline  Hyponatremia- slight, trend  Time spent on admission, talking to the patient, and coordinating care was: 55 mins.  Jayan Raymundo, DO 04/16/2012, 8:29 PM

## 2012-04-16 NOTE — ED Provider Notes (Signed)
History     CSN: [id number]  Arrival date & time 04/16/12  1415   First MD Initiated Contact with Patient 04/16/12 1528      Chief Complaint  Patient presents with  . Dizziness    (Consider location/radiation/quality/duration/timing/severity/associated sxs/prior treatment) HPI  Past Medical History  Diagnosis Date  . Hypertension   . Back pain, chronic     disabled due to chronic back pain  . Heart abnormality     unknown heart history  . Hyperlipidemia   . Chronic kidney disease     Past Surgical History  Procedure Date  . Cholecystectomy     History reviewed. No pertinent family history.  History  Substance Use Topics  . Smoking status: Former Smoker -- 5 years    Quit date: 04/17/2011  . Smokeless tobacco: Never Used  . Alcohol Use: 0.6 oz/week    1 Cans of beer per week     occasionally      Review of Systems  Allergies  Review of patient's allergies indicates no known allergies.  Home Medications   No current outpatient prescriptions on file.  BP 195/112  Pulse 52  Temp(Src) 98.7 F (37.1 C) (Oral)  Resp 19  Ht 6' (1.829 m)  Wt 259 lb (117.482 kg)  BMI 35.13 kg/m2  SpO2 98%  Physical Exam  Nursing note and vitals reviewed. Constitutional: He is oriented to person, place, and time. He appears well-developed.       Obese  HENT:  Head: Normocephalic and atraumatic.  Right Ear: External ear normal.  Left Ear: External ear normal.  Eyes: Conjunctivae and EOM are normal. Pupils are equal, round, and reactive to light.  Neck: Normal range of motion and phonation normal. Neck supple.  Cardiovascular: Regular rhythm, normal heart sounds and intact distal pulses.        Bradycardia  Pulmonary/Chest: Effort normal and breath sounds normal. He exhibits no bony tenderness.  Abdominal: Soft. Normal appearance. There is no tenderness.  Musculoskeletal: Normal range of motion.  Neurological: He is alert and oriented to person, place, and time. He  has normal strength. No cranial nerve deficit or sensory deficit. He exhibits normal muscle tone. Coordination normal.  Skin: Skin is warm, dry and intact.  Psychiatric: He has a normal mood and affect. His behavior is normal. Judgment and thought content normal.    ED Course  Procedures (including critical care time)  19:24-reevaluation: Heart rate currently 40-45 weeks per minute. He remains symptomatic with dizziness on sitting.    Labs Reviewed  COMPREHENSIVE METABOLIC PANEL - Abnormal; Notable for the following:    Sodium 134 (*)    Creatinine, Ser 1.71 (*)    GFR calc non Af Amer 42 (*)    GFR calc Af Amer 48 (*)    All other components within normal limits  CARDIAC PANEL(CRET KIN+CKTOT+MB+TROPI) - Abnormal; Notable for the following:    Total CK 270 (*)    All other components within normal limits  CARDIAC PANEL(CRET KIN+CKTOT+MB+TROPI) - Abnormal; Notable for the following:    Total CK 241 (*)    All other components within normal limits  HEMOGLOBIN A1C - Abnormal; Notable for the following:    Hemoglobin A1C 6.2 (*)    Mean Plasma Glucose 131 (*)    All other components within normal limits  BASIC METABOLIC PANEL - Abnormal; Notable for the following:    Creatinine, Ser 1.74 (*)    GFR calc non Af Amer 41 (*)  GFR calc Af Amer 47 (*)    All other components within normal limits  LIPID PANEL - Abnormal; Notable for the following:    Triglycerides 185 (*)    HDL 32 (*)    All other components within normal limits  CARDIAC PANEL(CRET KIN+CKTOT+MB+TROPI) - Abnormal; Notable for the following:    Total CK 235 (*)    All other components within normal limits  URINE RAPID DRUG SCREEN (HOSP PERFORMED) - Abnormal; Notable for the following:    Cocaine POSITIVE (*)    All other components within normal limits  CBC  DIFFERENTIAL  URINALYSIS, ROUTINE W REFLEX MICROSCOPIC  ETHANOL  CBC  URINE CULTURE  TSH   Component     Latest Ref Rng 09/15/2010 04/16/2012   Sodium     135 - 145 mEq/L 137 134 (L)  Potassium     3.5 - 5.1 mEq/L 4.7 3.8  Chloride     96 - 112 mEq/L 104 100  CO2     19 - 32 mEq/L 22 26  Glucose     70 - 99 mg/dL 94 96  BUN     6 - 23 mg/dL 14 8  Creat     1.19 - 1.35 mg/dL 1.47 (H) 8.29 (H)  Calcium     8.4 - 10.5 mg/dL 9.5 9.0  Total Protein     6.0 - 8.3 g/dL 6.9 7.1  Albumin     3.5 - 5.2 g/dL 4.2 3.8  AST     0 - 37 U/L 20 18  ALT     0 - 53 U/L 25 13  Alkaline Phosphatase     39 - 117 U/L 80 76  Total Bilirubin     0.3 - 1.2 mg/dL 0.9 0.5  GFR calc non Af Amer     >90 mL/min  42 (L)  GFR calc Af Amer     >90 mL/min  48 (L)      Dg Chest 2 View  04/16/2012  *RADIOLOGY REPORT*  Clinical Data: Dizziness.  Hypertension.  CHEST - 2 VIEW  Comparison: 05/23/2010.  Findings: Trachea is midline.  Heart size is accentuated by AP technique and somewhat low lung volumes.  Minimal bibasilar atelectasis.  Lungs are otherwise clear.  No pleural fluid.  IMPRESSION: Low lung volumes with minimal bibasilar atelectasis.  Original Report Authenticated By: Reyes Ivan, M.D.   Ct Head Wo Contrast  04/17/2012  *RADIOLOGY REPORT*  Clinical Data: Dizziness  CT HEAD WITHOUT CONTRAST  Technique:  Contiguous axial images were obtained from the base of the skull through the vertex without contrast.  Comparison: None.  Findings: Generalized atrophy of a mild to moderate degree. Extensive hypodensity throughout the cerebral white matter bilaterally, most likely related to chronic microvascular ischemia. No acute infarct, hemorrhage, or mass.  IMPRESSION: Atrophy and chronic ischemic change in the white matter.  No acute abnormality.  Original Report Authenticated By: Camelia Phenes, M.D.     1. Dizziness   2. Bradycardia       MDM  Dizziness with bradycardia, possibly caused by beta blocker therapy for blood pressure. The extra dose of labetalol he took yesterday is likely washed out. The patient is moderately debilitated  and is at risk for fall if discharged.    Plan: Admit- Hospitalist    Flint Melter, MD 04/17/12 [phone]

## 2012-04-17 ENCOUNTER — Encounter (HOSPITAL_COMMUNITY): Payer: Self-pay | Admitting: Internal Medicine

## 2012-04-17 ENCOUNTER — Inpatient Hospital Stay (HOSPITAL_COMMUNITY): Payer: Medicare Other

## 2012-04-17 DIAGNOSIS — I495 Sick sinus syndrome: Secondary | ICD-10-CM

## 2012-04-17 DIAGNOSIS — R42 Dizziness and giddiness: Secondary | ICD-10-CM

## 2012-04-17 DIAGNOSIS — F1410 Cocaine abuse, uncomplicated: Secondary | ICD-10-CM

## 2012-04-17 DIAGNOSIS — N189 Chronic kidney disease, unspecified: Secondary | ICD-10-CM | POA: Diagnosis present

## 2012-04-17 DIAGNOSIS — N183 Chronic kidney disease, stage 3 unspecified: Secondary | ICD-10-CM | POA: Diagnosis present

## 2012-04-17 DIAGNOSIS — I10 Essential (primary) hypertension: Secondary | ICD-10-CM

## 2012-04-17 LAB — CBC
HCT: 40.8 % (ref 39.0–52.0)
Hemoglobin: 13.9 g/dL (ref 13.0–17.0)
MCH: 29.2 pg (ref 26.0–34.0)
MCHC: 34.1 g/dL (ref 30.0–36.0)
MCV: 85.7 fL (ref 78.0–100.0)
Platelets: 242 10*3/uL (ref 150–400)
RBC: 4.76 MIL/uL (ref 4.22–5.81)
RDW: 13.7 % (ref 11.5–15.5)
WBC: 4 10*3/uL (ref 4.0–10.5)

## 2012-04-17 LAB — LIPID PANEL
Cholesterol: 154 mg/dL (ref 0–200)
HDL: 32 mg/dL — ABNORMAL LOW (ref >39)
LDL Cholesterol: 85 mg/dL (ref 0–99)
Total CHOL/HDL Ratio: 4.8 RATIO
Triglycerides: 185 mg/dL — ABNORMAL HIGH (ref <150)
VLDL: 37 mg/dL (ref 0–40)

## 2012-04-17 LAB — HEMOGLOBIN A1C
Hgb A1c MFr Bld: 6.2 % — ABNORMAL HIGH (ref <5.7)
Mean Plasma Glucose: 131 mg/dL — ABNORMAL HIGH (ref <117)

## 2012-04-17 LAB — RAPID URINE DRUG SCREEN, HOSP PERFORMED
Amphetamines: NOT DETECTED
Barbiturates: NOT DETECTED
Benzodiazepines: NOT DETECTED
Cocaine: POSITIVE — AB
Opiates: NOT DETECTED
Tetrahydrocannabinol: NOT DETECTED

## 2012-04-17 LAB — CARDIAC PANEL(CRET KIN+CKTOT+MB+TROPI)
CK, MB: 3.5 ng/mL (ref 0.3–4.0)
CK, MB: 3.6 ng/mL (ref 0.3–4.0)
Relative Index: 1.5 (ref 0.0–2.5)
Relative Index: 1.5 (ref 0.0–2.5)
Total CK: 241 U/L — ABNORMAL HIGH (ref 7–232)
Total CK: 235 U/L — ABNORMAL HIGH (ref 7–232)
Troponin I: <0.3 ng/mL (ref <0.30)
Troponin I: <0.3 ng/mL (ref <0.30)

## 2012-04-17 LAB — BASIC METABOLIC PANEL
BUN: 11 mg/dL (ref 6–23)
CO2: 25 mEq/L (ref 19–32)
Calcium: 9 mg/dL (ref 8.4–10.5)
Chloride: 101 mEq/L (ref 96–112)
Creatinine, Ser: 1.74 mg/dL — ABNORMAL HIGH (ref 0.50–1.35)
GFR calc Af Amer: 47 mL/min — ABNORMAL LOW (ref >90)
GFR calc non Af Amer: 41 mL/min — ABNORMAL LOW (ref >90)
Glucose, Bld: 96 mg/dL (ref 70–99)
Potassium: 3.6 mEq/L (ref 3.5–5.1)
Sodium: 135 mEq/L (ref 135–145)

## 2012-04-17 LAB — URINE CULTURE
Colony Count: NO GROWTH
Culture  Setup Time: 201305230045
Culture: NO GROWTH

## 2012-04-17 LAB — TSH: TSH: 1.492 u[IU]/mL (ref 0.350–4.500)

## 2012-04-17 MED ORDER — HYDRALAZINE HCL 25 MG PO TABS
25.0000 mg | ORAL_TABLET | Freq: Three times a day (TID) | ORAL | Status: DC
  Administered 2012-04-17 – 2012-04-18 (×3): 25 mg via ORAL
  Filled 2012-04-17 (×6): qty 1

## 2012-04-17 MED ORDER — CLONIDINE HCL 0.2 MG PO TABS
0.2000 mg | ORAL_TABLET | Freq: Two times a day (BID) | ORAL | Status: DC
  Filled 2012-04-17 (×2): qty 1

## 2012-04-17 MED ORDER — MECLIZINE HCL 25 MG PO TABS
25.0000 mg | ORAL_TABLET | Freq: Three times a day (TID) | ORAL | Status: DC
  Administered 2012-04-17 – 2012-04-18 (×4): 25 mg via ORAL
  Filled 2012-04-17 (×7): qty 1

## 2012-04-17 MED ORDER — AMLODIPINE BESYLATE 10 MG PO TABS
10.0000 mg | ORAL_TABLET | Freq: Every day | ORAL | Status: DC
  Administered 2012-04-17 – 2012-04-18 (×2): 10 mg via ORAL
  Filled 2012-04-17 (×2): qty 1

## 2012-04-17 MED ORDER — HYDRALAZINE HCL 20 MG/ML IJ SOLN
5.0000 mg | INTRAMUSCULAR | Status: DC | PRN
  Administered 2012-04-17: 5 mg via INTRAVENOUS
  Filled 2012-04-17 (×2): qty 1

## 2012-04-17 NOTE — Progress Notes (Signed)
  Echocardiogram 2D Echocardiogram has been performed.  Gildo Crisco L 04/17/2012, 3:44 PM

## 2012-04-17 NOTE — Evaluation (Signed)
Physical Therapy Evaluation Patient Details Name: Edward Stafford MRN: [id number] DOB: Apr 05, 1951 Today's Date: 04/17/2012 Time: 9629-5284 PT Time Calculation (min): 11 min  PT Assessment / Plan / Recommendation Clinical Impression  Pt admitted bradycardia and dizziness. Pt reports dizziness started 2 days ago and is worst when performing supine <-> sit and will only ease off if resting lying down.  Pt would benefit from acute PT services in order to improve safety and independence with ambulation by educating pt on gaze stabilization techniques to assist with reducing dizziness with ambulation (8/10).      PT Assessment  Patient needs continued PT services    Follow Up Recommendations  No PT follow up    Barriers to Discharge        lEquipment Recommendations  Rolling walker with 5" wheels (may need depending on dizziness at d/c)    Recommendations for Other Services     Frequency Min 3X/week    Precautions / Restrictions Precautions Precautions: Fall   Pertinent Vitals/Pain Assisted nsg tech with orthostatic vitals at beginning of session.  WNL.      Mobility  Bed Mobility Bed Mobility: Supine to Sit;Sit to Supine Supine to Sit: 6: Modified independent (Device/Increase time) Sit to Supine: 6: Modified independent (Device/Increase time) Transfers Transfers: Sit to Stand;Stand to Sit Sit to Stand: 4: Min guard;With upper extremity assist;From bed Stand to Sit: 4: Min guard;With upper extremity assist;To chair/3-in-1 Details for Transfer Assistance: assist for safety, pt reports blurry vision once return to sitting Ambulation/Gait Ambulation/Gait Assistance: 4: Min assist Ambulation Distance (Feet): 25 Feet Assistive device: 1 person hand held assist Ambulation/Gait Assistance Details: assist for balance, pt presents with left horizontal nystagmus with ambulation, educated to focus on one spot to reduce dizziness/spinning sensation Gait Pattern: Wide base of  support;Decreased stride length    Exercises     PT Diagnosis: Difficulty walking;Other (comment) (vertigo)  PT Problem List: Decreased balance;Decreased mobility;Decreased safety awareness;Decreased knowledge of use of DME;Other (comment) (vertigo) PT Treatment Interventions: DME instruction;Gait training;Functional mobility training;Therapeutic activities;Therapeutic exercise;Patient/family education;Other (comment) (visual exercises such as gaze stability)   PT Goals Acute Rehab PT Goals PT Goal Formulation: With patient Time For Goal Achievement: 04/24/12 Potential to Achieve Goals: Good Pt will go Sit to Stand: with modified independence PT Goal: Sit to Stand - Progress: Goal set today Pt will go Stand to Sit: with modified independence PT Goal: Stand to Sit - Progress: Goal set today Pt will Ambulate: >150 feet;with modified independence;with least restrictive assistive device PT Goal: Ambulate - Progress: Goal set today Additional Goals Additional Goal #1: Pt will demonstrate gaze stability exercise with handout if needed appropriately without cues. PT Goal: Additional Goal #1 - Progress: Goal set today  Visit Information  Last PT Received On: 04/17/12 Assistance Needed: +1    Subjective Data  Subjective: "The dizziness started on Tuesday."   Prior Functioning  Home Living Lives With: Other (Comment) (mother) Type of Home: House Home Layout: Two level Alternate Level Stairs-Number of Steps: 16 Alternate Level Stairs-Rails: Right Home Adaptive Equipment: None Prior Function Level of Independence: Independent Communication Communication: No difficulties    Cognition  Overall Cognitive Status: Appears within functional limits for tasks assessed/performed Arousal/Alertness: Awake/alert Orientation Level: Appears intact for tasks assessed Behavior During Session: [facility] for tasks performed    Extremity/Trunk Assessment Right Upper Extremity Assessment RUE  ROM/Strength/Tone: [facility] for tasks assessed Left Upper Extremity Assessment LUE ROM/Strength/Tone: [facility] for tasks assessed Right Lower Extremity Assessment RLE ROM/Strength/Tone: [facility] for tasks  assessed Left Lower Extremity Assessment LLE ROM/Strength/Tone: WFL for tasks assessed   Balance    End of Session PT - End of Session Equipment Utilized During Treatment: Gait belt Activity Tolerance: Other (comment) (limited by dizziness/spinning sensation) Patient left: in chair;with call bell/phone within reach   [facility] [place] E 04/17/2012, 10:59 AM Pager: [phone]

## 2012-04-17 NOTE — Progress Notes (Signed)
Physical Therapy Treatment Patient Details Name: Edward Stafford MRN: [id number] DOB: 12/18/1950 Today's Date: 04/17/2012 Time: 4098-1191 PT Time Calculation (min): 22 min  PT Assessment / Plan / Recommendation Comments on Treatment Session  Pt with left horizontal nystagmus at rest and upon sitting.  Negative VOR cancellation.  Pt denies hearing loss and ringing in ears.  Performed dix-hallpike: negative on left and horizontal left nystagmus on right >2 minutes.  Pt reports symptoms started 2 days ago and also reports last use of crack cocaine 2 days.  Nystagmus and dizziness likely related to crack cocaine use.  Pt encouraged to stop recreational drug use.  Pt also educated to focus on single unmoving object with position changes and ambulation to decrease spinning sensation.    Follow Up Recommendations  No PT follow up    Barriers to Discharge        Equipment Recommendations  Rolling walker with 5" wheels    Recommendations for Other Services    Frequency Min 3X/week   Plan Discharge plan remains appropriate;Frequency remains appropriate    Precautions / Restrictions Precautions Precautions: Fall   Pertinent Vitals/Pain Pt reports 3/10 dizziness with R dix hallpike and 5/10 dizziness upon changing positions (supine to sit, sit to supine)    Mobility  Bed Mobility Bed Mobility: Supine to Sit;Sit to Supine Supine to Sit: 6: Modified independent (Device/Increase time) Sit to Supine: 6: Modified independent (Device/Increase time) Transfers Transfers: Sit to Stand;Stand to Sit Sit to Stand: 4: Min guard;With upper extremity assist;From bed Stand to Sit: 4: Min guard;With upper extremity assist;To chair/3-in-1 Details for Transfer Assistance: assist for safety, pt reports blurry vision once return to sitting Ambulation/Gait Ambulation/Gait Assistance: 4: Min assist Ambulation Distance (Feet): 25 Feet Assistive device: 1 person hand held assist Ambulation/Gait Assistance  Details: assist for balance, pt presents with left horizontal nystagmus with ambulation, educated to focus on one spot to reduce dizziness/spinning sensation Gait Pattern: Wide base of support;Decreased stride length    Exercises     PT Diagnosis: Difficulty walking;Other (comment) (vertigo)  PT Problem List: Decreased balance;Decreased mobility;Decreased safety awareness;Decreased knowledge of use of DME;Other (comment) (vertigo) PT Treatment Interventions: DME instruction;Gait training;Functional mobility training;Therapeutic activities;Therapeutic exercise;Patient/family education;Other (comment) (visual exercises such as gaze stability)   PT Goals Acute Rehab PT Goals PT Goal Formulation: With patient Time For Goal Achievement: 04/24/12 Potential to Achieve Goals: Good Pt will go Sit to Stand: with modified independence PT Goal: Sit to Stand - Progress: Goal set today Pt will go Stand to Sit: with modified independence PT Goal: Stand to Sit - Progress: Goal set today Pt will Ambulate: >150 feet;with modified independence;with least restrictive assistive device PT Goal: Ambulate - Progress: Goal set today Additional Goals Additional Goal #1: Pt will demonstrate gaze stability exercise with handout if needed appropriately without cues. PT Goal: Additional Goal #1 - Progress: Progressing toward goal  Visit Information  Last PT Received On: 04/17/12 Assistance Needed: +1    Subjective Data  Subjective: "Yeah, the doctor said he would give me some medicine to help with the dizziness."   Cognition  Overall Cognitive Status: Appears within functional limits for tasks assessed/performed Arousal/Alertness: Awake/alert Orientation Level: Appears intact for tasks assessed Behavior During Session: [facility] for tasks performed    Balance     End of Session PT - End of Session Equipment Utilized During Treatment: Gait belt Activity Tolerance: Other (comment) (continues to report  dizziness) Patient left: in bed;with call bell/phone within reach    [facility]  E 04/17/2012, 11:22 AM Pager: [phone]

## 2012-04-17 NOTE — Progress Notes (Signed)
Subjective: Sitting up in bed eating breakfast. Denies pain/discomfort. Reports sleeping well. Says he has been feeling dizzy since yesterday. Describes it as if the room was spinning around. Denies any vision changes or focal deficits. Has had this problem before.  Objective: Vital signs Filed Vitals:   04/17/12 0605 04/17/12 0609 04/17/12 0649 04/17/12 0759  BP: 195/130 185/110 191/116 156/96  Pulse: 45 47    Temp: 97.9 F (36.6 C)     TempSrc: Oral     Resp: 16     Height:      Weight:      SpO2: 97%      Weight change:  Last BM Date: 04/16/12  Intake/Output from previous day: 05/22 0701 - 05/23 0700 In: -  Out: 750 [Urine:750] Total I/O In: 240 [P.O.:240] Out: -    Physical Exam: General: Alert, awake, oriented x3, in no acute distress. HEENT: No bruits, no goiter. Mucus membranes moist pink. Poor dentition. PERRL Heart: Regular  Rhythm, bradycardic at times.  without murmurs, rubs, gallops. No LEE PPP Lungs: Normal effort. Breath sounds clear to auscultation bilaterally. No wheeze, rhonchi Abdomen:  Obese, Soft, nontender, nondistended, positive bowel sounds. Extremities: No clubbing cyanosis or edema with positive pedal pulses. Neuro: Grossly intact, nonfocal. Nystagmus noted. No focal deficits.  Tele: Sinus Bradycardia to 30's. No blocks noted.   Lab Results: Basic Metabolic Panel:  Basename 04/17/12 0503 04/16/12 1705  NA 135 134*  K 3.6 3.8  CL 101 100  CO2 25 26  GLUCOSE 96 96  BUN 11 8  CREATININE 1.74* 1.71*  CALCIUM 9.0 9.0  MG -- --  PHOS -- --   Liver Function Tests:  Basename 04/16/12 1705  AST 18  ALT 13  ALKPHOS 76  BILITOT 0.5  PROT 7.1  ALBUMIN 3.8   No results found for this basename: LIPASE:2,AMYLASE:2 in the last 72 hours No results found for this basename: AMMONIA:2 in the last 72 hours CBC:  Basename 04/17/12 0503 04/16/12 1705  WBC 4.0 4.7  NEUTROABS -- 2.2  HGB 13.9 14.4  HCT 40.8 41.7  MCV 85.7 84.8  PLT 242 259     Cardiac Enzymes:  Basename 04/17/12 0503 04/16/12 2050  CKTOTAL 241* 270*  CKMB 3.5 3.5  CKMBINDEX -- --  TROPONINI <0.30 <0.30   Hemoglobin A1C:  Basename 04/16/12 2050  HGBA1C 6.2*   Fasting Lipid Panel:  Basename 04/17/12 0503  CHOL 154  HDL 32*  LDLCALC 85  TRIG 478*  CHOLHDL 4.8  LDLDIRECT --   Urine Drug Screen: Drugs of Abuse     Component Value Date/Time   LABOPIA NONE DETECTED 04/17/2012 0549   COCAINSCRNUR POSITIVE* 04/17/2012 0549   LABBENZ NONE DETECTED 04/17/2012 0549   AMPHETMU NONE DETECTED 04/17/2012 0549   THCU NONE DETECTED 04/17/2012 0549   LABBARB NONE DETECTED 04/17/2012 0549    Alcohol Level:  Basename 04/16/12 1705  ETH <11   Urinalysis:  Basename 04/16/12 1636  COLORURINE YELLOW  LABSPEC 1.010  PHURINE 6.5  GLUCOSEU NEGATIVE  HGBUR NEGATIVE  BILIRUBINUR NEGATIVE  KETONESUR NEGATIVE  PROTEINUR NEGATIVE  UROBILINOGEN 0.2  NITRITE NEGATIVE  LEUKOCYTESUR NEGATIVE    Studies/Results: Dg Chest 2 View  04/16/2012  *RADIOLOGY REPORT*  Clinical Data: Dizziness.  Hypertension.  CHEST - 2 VIEW  Comparison: 05/23/2010.  Findings: Trachea is midline.  Heart size is accentuated by AP technique and somewhat low lung volumes.  Minimal bibasilar atelectasis.  Lungs are otherwise clear.  No pleural fluid.  IMPRESSION: Low lung volumes with minimal bibasilar atelectasis.  Original Report Authenticated By: Reyes Ivan, M.D.    Medications: Scheduled Meds:   . aspirin EC  81 mg Oral Daily  . enoxaparin  40 mg Subcutaneous Q24H  . hydrALAZINE  25 mg Oral Q6H  . lisinopril  20 mg Oral Daily  . sodium chloride  1,000 mL Intravenous Once  . sodium chloride  3 mL Intravenous Q12H  . DISCONTD: cloNIDine  0.2 mg Oral BID  . DISCONTD: lisinopril  10 mg Oral Daily  . DISCONTD: pneumococcal 23 valent vaccine  0.5 mL Intramuscular Tomorrow-1000   Continuous Infusions:   . DISCONTD: sodium chloride 125 mL/hr at 04/16/12 2018   PRN  Meds:.acetaminophen, acetaminophen, hydrALAZINE, HYDROcodone-acetaminophen, ondansetron (ZOFRAN) IV, ondansetron  Assessment/Plan:  Principal Problem:  *Bradycardia Active Problems:  DIABETES MELLITUS, TYPE II, CONTROLLED  TOBACCO ABUSE  HYPERTENSION  CORONARY ARTERY DISEASE  Dizziness  Chronic kidney disease   Sinus Bradycardia ?related to too much labetalol. Have stopped labetalol for now. Patient is stable. Continues with episodes of bradycardia. HR range 45-55. 2d Echo pending. CE with slightly elevated total CK trending downward. Troponin WNL.   DIABETES MELLITUS, TYPE II, CONTROLLED HgA1C 6.2. Reports not on home meds. Will monitor CBG 96   TOBACCO ABUSE Stopped   Cocaine abuse Avoid BB. Offered substance abuse counseling.  HYPERTENSION Holding rate lowering medications labetalol and clonidine. Added hydralazine and increased lisinopril on admission. Remains uncontrolled. Will provide prn hydralazine and add norvasc. Avoid sudden drop in BP.  MILD CORONARY ARTERY DISEASE Mild disease on cath in 2008. Trop normal. CK elevated likely due to cocaine.    Dizziness Likely due to Vertigo. Check CT. Start meclizine.   CKD Chart review indicates  at baseline. Monitor.  Hyponatremia Mild. Resolved this am. Monitor.  Full Code  DVT Prophylaxis Enoxaparin  Disposition Lives at home with his mother. Hopefully ready 24-48 hours.       LOS: 1 day   [facility] M 04/17/2012, 8:07 AM  Patient seen and examined. Agree with above note with changes. Discussed with patient. Will get CT head. Start meclizine. If work up is negative he should be able to go home in AM.  Caylor Tallarico 9:35 AM

## 2012-04-17 NOTE — Progress Notes (Signed)
   CARE MANAGEMENT NOTE 04/17/2012  Patient:  Edward Stafford, Edward Stafford   Account Number:  [id number]  Date Initiated:  04/17/2012  Documentation initiated by:  Jiles Crocker  Subjective/Objective Assessment:   ADMITTED WITH DIZZINESS, HYPOTENSION     Action/Plan:   INDEPENDENT PRIOR TO ADMISSION;   Anticipated DC Date:  04/18/2012   Anticipated DC Plan:  HOME/SELF CARE  In-house referral  Clinical Social Worker                  Status of service:  In process, will continue to follow Medicare Important Message given?  NA - LOS <3 / Initial given by admissions (If response is "NO", the following Medicare IM given date fields will be blank)  Per UR Regulation:  Reviewed for med. necessity/level of care/duration of stay  Comments:  04/17/2012- B Jonasia Coiner RN, BSN, MHA

## 2012-04-17 NOTE — Progress Notes (Signed)
FOLLOW-UP ADULT NUTRITION ASSESSMENT Date: 04/17/2012   Time: 12:38 PM Reason for Assessment: Nutrition risk   ASSESSMENT: Male 61 y.o.  Dx: Bradycardia  Food/Nutrition Related Hx: Pt reports eating 2 meals/day PTA, mostly junk food, but he knows he needs to eat healthier. Pt reports his appetite has been down for the past month with 15 pound unintended weight loss during this time frame. Pt without any teeth, however reports he can eat without any problems - also denies any problems swallowing. Pt working on lunch during visit. Pt reports great appetite and ate 100% of breakfast this morning.   Hx:  Past Medical History  Diagnosis Date  . Hypertension   . Back pain, chronic     disabled due to chronic back pain  . Heart abnormality     unknown heart history  . Hyperlipidemia   . Chronic kidney disease    Related Meds:  Scheduled Meds:   . amLODipine  10 mg Oral Daily  . aspirin EC  81 mg Oral Daily  . enoxaparin  40 mg Subcutaneous Q24H  . hydrALAZINE  25 mg Oral Q8H  . lisinopril  20 mg Oral Daily  . meclizine  25 mg Oral TID  . sodium chloride  1,000 mL Intravenous Once  . sodium chloride  3 mL Intravenous Q12H  . DISCONTD: cloNIDine  0.2 mg Oral BID  . DISCONTD: hydrALAZINE  25 mg Oral Q6H  . DISCONTD: lisinopril  10 mg Oral Daily  . DISCONTD: pneumococcal 23 valent vaccine  0.5 mL Intramuscular Tomorrow-1000   Continuous Infusions:   . DISCONTD: sodium chloride 125 mL/hr at 04/16/12 2018   PRN Meds:.acetaminophen, acetaminophen, hydrALAZINE, HYDROcodone-acetaminophen, ondansetron (ZOFRAN) IV, ondansetron  Ht: 6' (182.9 cm)  Wt: 259 lb (117.482 kg)  Ideal Wt: 178 lb % Ideal Wt: 146  Usual Wt: 274 lb  % Usual Wt: 94  Body mass index is 35.13 kg/(m^2).   Labs:  CMP     Component Value Date/Time   NA 135 04/17/2012 0503   K 3.6 04/17/2012 0503   CL 101 04/17/2012 0503   CO2 25 04/17/2012 0503   GLUCOSE 96 04/17/2012 0503   BUN 11 04/17/2012 0503   CREATININE 1.74* 04/17/2012 0503   CALCIUM 9.0 04/17/2012 0503   PROT 7.1 04/16/2012 1705   ALBUMIN 3.8 04/16/2012 1705   AST 18 04/16/2012 1705   ALT 13 04/16/2012 1705   ALKPHOS 76 04/16/2012 1705   BILITOT 0.5 04/16/2012 1705   GFRNONAA 41* 04/17/2012 0503   GFRAA 47* 04/17/2012 0503     Intake/Output Summary (Last 24 hours) at 04/17/12 1241 Last data filed at 04/17/12 0900  Gross per 24 hour  Intake    240 ml  Output    850 ml  Net   -610 ml   Last BM - 04/17/12  Diet Order: Carb Control   IVF:    DISCONTD: sodium chloride Last Rate: 125 mL/hr at 04/16/12 2018    Estimated Nutritional Needs:   Kcal: 2050-2400 Protein: 80-95g Fluid: 2-2.4L  NUTRITION DIAGNOSIS: -Unintended weight loss   Status:  Ongoing -Pt meets criteria for moderate PCM of acute illness AEB 5.4% weight loss and  <75% energy intake in the past month per pt report   RELATED TO: poor appetite, poor dietary choices  AS EVIDENCE BY: pt statement  MONITORING/EVALUATION(Goals): Pt to continue to consume >90% of meals.   EDUCATION NEEDS: -No education needs identified at this time  INTERVENTION: Encouraged continued excellent intake.  Dietitian #: [phone]  DOCUMENTATION CODES Per approved criteria  -Non-severe (moderate) malnutrition in the context of acute illness or injury -Obesity Unspecified    Edward Stafford 04/17/2012, 12:38 PM

## 2012-04-18 DIAGNOSIS — F1410 Cocaine abuse, uncomplicated: Secondary | ICD-10-CM

## 2012-04-18 DIAGNOSIS — I10 Essential (primary) hypertension: Secondary | ICD-10-CM

## 2012-04-18 DIAGNOSIS — R42 Dizziness and giddiness: Secondary | ICD-10-CM

## 2012-04-18 DIAGNOSIS — I495 Sick sinus syndrome: Secondary | ICD-10-CM

## 2012-04-18 LAB — BASIC METABOLIC PANEL
BUN: 13 mg/dL (ref 6–23)
CO2: 21 mEq/L (ref 19–32)
Calcium: 9 mg/dL (ref 8.4–10.5)
Chloride: 102 mEq/L (ref 96–112)
Creatinine, Ser: 1.6 mg/dL — ABNORMAL HIGH (ref 0.50–1.35)
GFR calc Af Amer: 52 mL/min — ABNORMAL LOW (ref >90)
GFR calc non Af Amer: 45 mL/min — ABNORMAL LOW (ref >90)
Glucose, Bld: 92 mg/dL (ref 70–99)
Potassium: 3.8 mEq/L (ref 3.5–5.1)
Sodium: 136 mEq/L (ref 135–145)

## 2012-04-18 MED ORDER — AMLODIPINE BESYLATE 10 MG PO TABS: 10.0000 mg | ORAL_TABLET | Freq: Every day | ORAL | Status: DC

## 2012-04-18 MED ORDER — MECLIZINE HCL 25 MG PO TABS: 25.0000 mg | ORAL_TABLET | Freq: Three times a day (TID) | ORAL | Status: AC

## 2012-04-18 MED ORDER — LISINOPRIL 10 MG PO TABS: 20.0000 mg | ORAL_TABLET | Freq: Every day | ORAL | Status: DC

## 2012-04-18 MED ORDER — HYDRALAZINE HCL 25 MG PO TABS: 25.0000 mg | ORAL_TABLET | Freq: Three times a day (TID) | ORAL | Status: DC

## 2012-04-18 NOTE — Discharge Instructions (Signed)
Vertigo Vertigo means you feel like you or your surroundings are moving when they are not. Vertigo can be dangerous if it occurs when you are at work, driving, or performing difficult activities.  CAUSES  Vertigo occurs when there is a conflict of signals sent to your brain from the visual and sensory systems in your body. There are many different causes of vertigo, including:  Infections, especially in the inner ear.   A bad reaction to a drug or misuse of alcohol and medicines.   Withdrawal from drugs or alcohol.   Rapidly changing positions, such as lying down or rolling over in bed.   A migraine headache.   Decreased blood flow to the brain.   Increased pressure in the brain from a head injury, infection, tumor, or bleeding.  SYMPTOMS  You may feel as though the world is spinning around or you are falling to the ground. Because your balance is upset, vertigo can cause nausea and vomiting. You may have involuntary eye movements (nystagmus). DIAGNOSIS  Vertigo is usually diagnosed by physical exam. If the cause of your vertigo is unknown, your caregiver may perform imaging tests, such as an MRI scan (magnetic resonance imaging). TREATMENT  Most cases of vertigo resolve on their own, without treatment. Depending on the cause, your caregiver may prescribe certain medicines. If your vertigo is related to body position issues, your caregiver may recommend movements or procedures to correct the problem. In rare cases, if your vertigo is caused by certain inner ear problems, you may need surgery. HOME CARE INSTRUCTIONS   Follow your caregiver's instructions.   Avoid driving.   Avoid operating heavy machinery.   Avoid performing any tasks that would be dangerous to you or others during a vertigo episode.   Tell your caregiver if you notice that certain medicines seem to be causing your vertigo. Some of the medicines used to treat vertigo episodes can actually make them worse in some  people.  SEEK IMMEDIATE MEDICAL CARE IF:   Your medicines do not relieve your vertigo or are making it worse.   You develop problems with talking, walking, weakness, or using your arms, hands, or legs.   You develop severe headaches.   Your nausea or vomiting continues or gets worse.   You develop visual changes.   A family member notices behavioral changes.   Your condition gets worse.  MAKE SURE YOU:  Understand these instructions.   Will watch your condition.   Will get help right away if you are not doing well or get worse.  Document Released: 08/22/2005 Document Revised: 11/01/2011 Document Reviewed: 05/31/2011 [employer] Patient Information 2012 [employer]. 

## 2012-04-18 NOTE — Discharge Summary (Signed)
Physician Discharge Summary  Patient ID: Edward Stafford MRN: [id number] DOB/AGE: 1951/06/20 61 y.o.  Admit date: 04/16/2012 Discharge date: 04/18/2012  PCP: Julieanne Manson, MD, MD  Discharge Diagnoses:  Principal Problem:  *Bradycardia Active Problems:  DIABETES MELLITUS, TYPE II, CONTROLLED  TOBACCO ABUSE  HYPERTENSION  CORONARY ARTERY DISEASE  Dizziness  Chronic kidney disease   Discharged Condition: fair  Initial History: Edward Stafford is a 61 y.o. AA male who is a poor historian. Has a 1 1/2day history of dizziness. Says he has been off his BP meds for 3 days but was able to get them filled on the day before admission and took 2 labetalol (more than his normal dose). He also used crack cocaine in the last week. Denied CP, No SOB. Mentioned that he is seeing a new doctor but does not know her name. Also says he had a stress test 1 year ago but does not remember where. In the ER his HR was found to be low- he is dizzy, not at rest but when he gets up.   Hospital Course:   Dizziness  Initially. This was thought to be secondary to bradycardia and hypotension. However, the symptoms was just of her vertigo. Patient was started on meclizine. He hasn't seen much improvement. However, he was ambulated by physical therapy. He does not require any assistance, was at home at this time. Continue with meclizine for now. He did get a CT of his head which did not show any acute findings.   Sinus Bradycardia and Accelerated Hypertension Bradycardia was probably related to clonidine, as well as the beta blockers that he had been taking at home. His heart rate was as low as 30s. Beta blockers were discontinued. Clonidine was also discontinued. He was started on other antihypertensive agents. He did experience some rebound hypertension but his blood pressure over the last 12 hours has been stable and reasonably well controlled. He will be prescribed alternative medications. His heart  rate has also come up to the 60s and 70s. Beta blockers will be discontinued also, due to the fact, that he had the cocaine in his urine. He can followup with his primary care physician and discuss further options for his hypertension. Cardiac enzymes are normal. Echocardiogram showed a normal EF  DIABETES MELLITUS, TYPE II, CONTROLLED  HgA1C 6.2. Reports not on home meds. This will need to be addressed by his PCP.  TOBACCO ABUSE and Cocaine abuse  Denies smoking at this time. He was strongly counseled to avoid cocaine use. He understands and he promises not to use it again  MILD CORONARY ARTERY DISEASE  Mild disease on cath in 2008. Trop normal. CK elevated likely due to cocaine. He remained chest pain free.  CKD  Chart review indicates creatinine is at baseline.    Hyponatremia  This was mild and has resolved at this time.  PERTINENT LABS  Sodium was 134 at admission. Creatinine was 1.71 at admission, and 1.6, 0 at discharge. Troponins were negative. LDL was 85. Triglycerides 185. TSH was 1.49. HbA1c was 6.2.  IMAGING STUDIES Dg Chest 2 View  04/16/2012  *RADIOLOGY REPORT*  Clinical Data: Dizziness.  Hypertension.  CHEST - 2 VIEW  Comparison: 05/23/2010.  Findings: Trachea is midline.  Heart size is accentuated by AP technique and somewhat low lung volumes.  Minimal bibasilar atelectasis.  Lungs are otherwise clear.  No pleural fluid.  IMPRESSION: Low lung volumes with minimal bibasilar atelectasis.  Original Report Authenticated By: Reyes Ivan, M.D.  Ct Head Wo Contrast  04/17/2012  *RADIOLOGY REPORT*  Clinical Data: Dizziness  CT HEAD WITHOUT CONTRAST  Technique:  Contiguous axial images were obtained from the base of the skull through the vertex without contrast.  Comparison: None.  Findings: Generalized atrophy of a mild to moderate degree. Extensive hypodensity throughout the cerebral white matter bilaterally, most likely related to chronic microvascular ischemia. No acute  infarct, hemorrhage, or mass.  IMPRESSION: Atrophy and chronic ischemic change in the white matter.  No acute abnormality.  Original Report Authenticated By: Camelia Phenes, M.D.    Discharge Exam: Blood pressure 150/98, pulse 52, temperature 97.5 F (36.4 C), temperature source Oral, resp. rate 20, height 6' (1.829 m), weight 117.482 kg (259 lb), SpO2 97.00%. General appearance: alert, cooperative, appears stated age and no distress Head: Normocephalic, without obvious abnormality, atraumatic Resp: clear to auscultation bilaterally Cardio: regular rate and rhythm, S1, S2 normal, no murmur, click, rub or gallop GI: soft, non-tender; bowel sounds normal; no masses,  no organomegaly Pulses: 2+ and symmetric Neurologic: Alert and oriented X 3, normal strength and tone. Normal symmetric reflexes. Normal coordination and gait  Disposition: 01-Home or Self Care  Discharge Orders    Future Orders Please Complete By Expires   Diet - low sodium heart healthy      Increase activity slowly      Discharge instructions      Comments:   Please see your doctor as scheduled. If your dizziness doesn't get better call for an earlier appointment.   Driving Restrictions      Comments:   Dont drive as long you are dizzi     Current Discharge Medication List    START taking these medications   Details  amLODipine (NORVASC) 10 MG tablet Take 1 tablet (10 mg total) by mouth daily. Qty: 30 tablet, Refills: 2    hydrALAZINE (APRESOLINE) 25 MG tablet Take 1 tablet (25 mg total) by mouth every 8 (eight) hours. Qty: 90 tablet, Refills: 1    meclizine (ANTIVERT) 25 MG tablet Take 1 tablet (25 mg total) by mouth 3 (three) times daily. Take three times a day for 5 days and then as needed for dizziness Qty: 60 tablet, Refills: 0      CONTINUE these medications which have CHANGED   Details  lisinopril (PRINIVIL,ZESTRIL) 10 MG tablet Take 2 tablets (20 mg total) by mouth daily. Qty: 60 tablet, Refills: 1        STOP taking these medications     cloNIDine (CATAPRES) 0.2 MG tablet      labetalol (NORMODYNE) 200 MG tablet          Total Discharge Time: 35 mins  [facility]  [facility] Pager [phone]  04/18/2012, 9:06 AM

## 2013-03-16 DIAGNOSIS — I2510 Atherosclerotic heart disease of native coronary artery without angina pectoris: Secondary | ICD-10-CM | POA: Diagnosis not present

## 2013-03-16 DIAGNOSIS — Z9189 Other specified personal risk factors, not elsewhere classified: Secondary | ICD-10-CM | POA: Diagnosis not present

## 2013-03-16 DIAGNOSIS — Z6834 Body mass index (BMI) 34.0-34.9, adult: Secondary | ICD-10-CM | POA: Diagnosis not present

## 2013-03-16 DIAGNOSIS — N189 Chronic kidney disease, unspecified: Secondary | ICD-10-CM | POA: Diagnosis not present

## 2013-03-16 DIAGNOSIS — R7301 Impaired fasting glucose: Secondary | ICD-10-CM | POA: Diagnosis not present

## 2013-03-16 DIAGNOSIS — I10 Essential (primary) hypertension: Secondary | ICD-10-CM | POA: Diagnosis not present

## 2013-03-31 DIAGNOSIS — E785 Hyperlipidemia, unspecified: Secondary | ICD-10-CM | POA: Diagnosis not present

## 2013-03-31 DIAGNOSIS — I2510 Atherosclerotic heart disease of native coronary artery without angina pectoris: Secondary | ICD-10-CM | POA: Diagnosis not present

## 2013-03-31 DIAGNOSIS — I10 Essential (primary) hypertension: Secondary | ICD-10-CM | POA: Diagnosis not present

## 2013-03-31 DIAGNOSIS — Z125 Encounter for screening for malignant neoplasm of prostate: Secondary | ICD-10-CM | POA: Diagnosis not present

## 2013-03-31 DIAGNOSIS — R7301 Impaired fasting glucose: Secondary | ICD-10-CM | POA: Diagnosis not present

## 2013-04-02 DIAGNOSIS — Z0000 Encounter for general adult medical examination without abnormal findings: Secondary | ICD-10-CM | POA: Diagnosis not present

## 2013-04-02 DIAGNOSIS — R972 Elevated prostate specific antigen [PSA]: Secondary | ICD-10-CM | POA: Diagnosis not present

## 2013-04-06 DIAGNOSIS — N189 Chronic kidney disease, unspecified: Secondary | ICD-10-CM | POA: Diagnosis not present

## 2013-04-06 DIAGNOSIS — I2510 Atherosclerotic heart disease of native coronary artery without angina pectoris: Secondary | ICD-10-CM | POA: Diagnosis not present

## 2013-04-06 DIAGNOSIS — R7301 Impaired fasting glucose: Secondary | ICD-10-CM | POA: Diagnosis not present

## 2013-04-06 DIAGNOSIS — Z6834 Body mass index (BMI) 34.0-34.9, adult: Secondary | ICD-10-CM | POA: Diagnosis not present

## 2013-04-06 DIAGNOSIS — I10 Essential (primary) hypertension: Secondary | ICD-10-CM | POA: Diagnosis not present

## 2013-04-06 DIAGNOSIS — E785 Hyperlipidemia, unspecified: Secondary | ICD-10-CM | POA: Diagnosis not present

## 2013-04-06 DIAGNOSIS — R972 Elevated prostate specific antigen [PSA]: Secondary | ICD-10-CM | POA: Diagnosis not present

## 2013-04-06 DIAGNOSIS — Z0000 Encounter for general adult medical examination without abnormal findings: Secondary | ICD-10-CM | POA: Diagnosis not present

## 2013-09-17 DIAGNOSIS — Z23 Encounter for immunization: Secondary | ICD-10-CM | POA: Diagnosis not present

## 2014-04-16 ENCOUNTER — Ambulatory Visit (INDEPENDENT_AMBULATORY_CARE_PROVIDER_SITE_OTHER): Payer: Medicare Other | Admitting: Internal Medicine

## 2014-04-16 VITALS — BP 136/90 | HR 63 | Temp 98.3°F | Resp 18 | Ht 71.0 in | Wt 275.2 lb

## 2014-04-16 DIAGNOSIS — Z125 Encounter for screening for malignant neoplasm of prostate: Secondary | ICD-10-CM | POA: Diagnosis not present

## 2014-04-16 DIAGNOSIS — Z6838 Body mass index (BMI) 38.0-38.9, adult: Secondary | ICD-10-CM | POA: Diagnosis not present

## 2014-04-16 DIAGNOSIS — I10 Essential (primary) hypertension: Secondary | ICD-10-CM

## 2014-04-16 DIAGNOSIS — N289 Disorder of kidney and ureter, unspecified: Secondary | ICD-10-CM

## 2014-04-16 MED ORDER — AMLODIPINE BESYLATE 10 MG PO TABS: 10.0000 mg | ORAL_TABLET | Freq: Every day | ORAL | Status: DC

## 2014-04-16 NOTE — Progress Notes (Addendum)
Subjective:    Patient ID: Edward Stafford, male    DOB: 04/15/1951, 63 y.o.   MRN: [id number] This chart was scribed for Edward Lin, MD by Anastasia Pall, ED Scribe. This patient was seen in room 11 and the patient's care was started at 5:57 PM.  Chief Complaint  Patient presents with  . Medication Refill   HPI Edward Stafford is a 63 y.o. male Pt presents for BP refill. He has h/o HTN since 2008.   He reports getting his prior BP medication was prescribed at the clinic Dr. Berkley Harvey and Dr. Owens Shark used to work for. He reports being out of his BP medication. He has not seen a physician for the last year. Pt was in the ER last year for gall bladder and kidney issues. Pt reports not having blood work done since then.  He denies chest pain, palpitations, LE swelling, prostate problems, indigestion, and any other symptoms. He denies allergies. He reports being disabled since 2 years ago with back pain. He reports his mother had h/o severe HTN, gout, and 3 knee replacements.   He has no memory of his last tetanus shot but chart reveals 2008/also Pneumovax 2011  He was affected by the loss of his mother 15 months ago He denies current drug issues  PCP Mack Hook, MD listed but actually is unclear He indicates that his cousin has set up his new PCP at [facility] but he cannot be seen until August  Patient Active Problem List   Diagnosis Date Noted  . Chronic kidney disease 04/17/2012  . Bradycardia 04/16/2012  . Dizziness--- not a current complaint  04/16/2012  . CORONARY ARTERY DISEASE 05/17/2010  . SINUS BRADYCARDIA 05/17/2010  . CHEST PAIN 05/17/2010  . GERD 01/19/2010  . DIABETES MELLITUS, TYPE II, CONTROLLED----he says this diagnosis was cleared  06/08/2008  . GLAUCOMA 12/19/2007  . HYPERLIPIDEMIA, MIXED-----he thinks this is also clear  04/19/2007  . DEPENDENCE, COCAINE, UNSPECIFIED 04/19/2007  . TOBACCO ABUSE 04/19/2007  . MARIJUANA ABUSE 04/19/2007    . HYPERTENSION 04/19/2007  . DISEASE, HTN CKD, NOS, W/CKD, STG I-IV/UNSPC 04/19/2007   Prior to Admission medications   Medication Sig Start Date End Date Taking? Authorizing Provider  amLODipine (NORVASC) 10 MG tablet Take 1 tablet (10 mg total) by mouth daily. 04/18/12 04/16/14 Yes Edward Haff, MD  hydrALAZINE (APRESOLINE) 25 MG tablet---actually says he is not on this medication anymore  Take 1 tablet (25 mg total) by mouth every 8 (eight) hours. 04/18/12 04/18/13   Edward Haff, MD   Review of Systems  Constitutional: Negative for fever, activity change, appetite change, fatigue and unexpected weight change.  Eyes: Negative for visual disturbance.  Respiratory: Negative for cough, choking, chest tightness and shortness of breath.   Cardiovascular: Negative for chest pain, palpitations and leg swelling.  Gastrointestinal: Negative for abdominal pain.  Endocrine: Negative for polydipsia.  Genitourinary: Negative.  Negative for difficulty urinating.  Musculoskeletal: Negative for back pain and joint swelling.  Allergic/Immunologic: Negative for environmental allergies.  Neurological: Negative for weakness and headaches.  Psychiatric/Behavioral: Negative for sleep disturbance and dysphoric mood.      Objective:   Physical Exam  Nursing note and vitals reviewed. Constitutional: He is oriented to person, place, and time. He appears well-developed and well-nourished. No distress.  HENT:  Head: Normocephalic and atraumatic.  Eyes: Conjunctivae and EOM are normal. Pupils are equal, round, and reactive to light.  Neck: Neck supple. Carotid bruit is not present. No thyromegaly present.  Cardiovascular: Normal rate, regular rhythm, normal heart sounds and intact distal pulses.   No murmur heard. Pulmonary/Chest: Effort normal and breath sounds normal. No respiratory distress. He has no wheezes. He has no rales.  Abdominal: Soft. Bowel sounds are normal. He exhibits no abdominal bruit and  no mass. There is no tenderness.  Musculoskeletal: Normal range of motion. He exhibits no edema and no tenderness.  Lymphadenopathy:    He has no cervical adenopathy.  Neurological: He is alert and oriented to person, place, and time.  Skin: Skin is warm and dry.  Psychiatric: He has a normal mood and affect. His behavior is normal.   BP 136/90  Pulse 63  Temp(Src) 98.3 F (36.8 C) (Oral)  Resp 18  Ht 5\' 11"  (1.803 m)  Wt 275 lb 3.2 oz (124.83 kg)  BMI 38.40 kg/m2  SpO2 97%     Assessment & Plan:  HTN (hypertension) - Plan: Comprehensive metabolic panel, CBC, Lipid panel  Renal insufficiency - Plan: Comprehensive metabolic panel, CBC, Lipid panel  BMI 38.0-38.9,adult  Possible other problems since he has fallen out of care Tried to order PSA but Medicare rejected this indicating he has had a screen somewhere in the last year-not in EPIC  Meds ordered this encounter  Medications  . amLODipine (NORVASC) 10 MG tablet    Sig: Take 1 tablet (10 mg total) by mouth daily.    Dispense:  90 tablet    Refill:  1   F/u [facility] in aug     I have completed the patient encounter in its entirety as documented by the scribe, with editing by me where necessary. Edward Stafford P. Laney Pastor, M.D.

## 2014-04-17 LAB — COMPREHENSIVE METABOLIC PANEL
ALT: 25 U/L (ref 0–53)
AST: 20 U/L (ref 0–37)
Albumin: 4.3 g/dL (ref 3.5–5.2)
Alkaline Phosphatase: 112 U/L (ref 39–117)
BUN: 9 mg/dL (ref 6–23)
CO2: 24 mEq/L (ref 19–32)
Calcium: 9.1 mg/dL (ref 8.4–10.5)
Chloride: 102 mEq/L (ref 96–112)
Creat: 1.48 mg/dL — ABNORMAL HIGH (ref 0.50–1.35)
Glucose, Bld: 112 mg/dL — ABNORMAL HIGH (ref 70–99)
Potassium: 3.5 mEq/L (ref 3.5–5.3)
Sodium: 134 mEq/L — ABNORMAL LOW (ref 135–145)
Total Bilirubin: 0.6 mg/dL (ref 0.2–1.2)
Total Protein: 7.3 g/dL (ref 6.0–8.3)

## 2014-04-22 ENCOUNTER — Encounter: Payer: Self-pay | Admitting: Internal Medicine

## 2014-05-01 ENCOUNTER — Ambulatory Visit: Payer: Medicare Other

## 2014-05-01 ENCOUNTER — Ambulatory Visit (INDEPENDENT_AMBULATORY_CARE_PROVIDER_SITE_OTHER): Payer: Medicare Other | Admitting: Family Medicine

## 2014-05-01 VITALS — BP 156/82 | HR 55 | Temp 98.2°F | Resp 18 | Ht 70.0 in | Wt 272.8 lb

## 2014-05-01 DIAGNOSIS — M549 Dorsalgia, unspecified: Secondary | ICD-10-CM

## 2014-05-01 DIAGNOSIS — M47817 Spondylosis without myelopathy or radiculopathy, lumbosacral region: Secondary | ICD-10-CM

## 2014-05-01 LAB — POCT URINALYSIS DIPSTICK
Bilirubin, UA: NEGATIVE
Blood, UA: NEGATIVE
Glucose, UA: NEGATIVE
Ketones, UA: NEGATIVE
Leukocytes, UA: NEGATIVE
Nitrite, UA: NEGATIVE
Protein, UA: NEGATIVE
Spec Grav, UA: <=1.005
Urobilinogen, UA: 0.2
pH, UA: 5.5

## 2014-05-01 MED ORDER — METHYLPREDNISOLONE (PAK) 4 MG PO TABS: ORAL_TABLET | ORAL | Status: DC

## 2014-05-01 NOTE — Progress Notes (Signed)
This is a 63 y.o.male who complains of right low back pain.  Character of pain: 80 Location of pain:  Right lower lumbar paraspinal area Radiation of pain:  Some radiation down into the buttock Onset associated with:  Nothing Patient has a past history of low back pain for which he has become disabled. 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 [place] Vignola had no fever.  SDelafayette Bazar has tried ibuprofen.  Past Medical History  Diagnosis Date  . Hypertension   . Back pain, chronic     disabled due to chronic back pain  . Heart abnormality     unknown heart history  . Hyperlipidemia   . Chronic kidney disease      Past Surgical History  Procedure Laterality Date  . Cholecystectomy      Objective:   middle-aged male in no acute distress. Blood pressure 156/82, pulse 55, temperature 98.2 F (36.8 C), temperature source Oral, resp. rate 18, height 5\' 10"  (1.778 m), weight 272 lb 12.8 oz (123.741 kg), SpO2 98.00%.Body mass index is 39.14 kg/(m^2). Palpation of the back reveals no tenderness CVA:  nontender Abdomen: Soft, nontender Peripheral pulses:  DP/PT intact Inspection of the back: Reveals no scoliosis Straight-leg raising: Negative Motor exam of lower extremity: No abnormal weakness. Reflexes: Symmetric and normal Skin exam: No rash [facility] reading (PRIMARY) by  Dr. Joseph Art lumbar series. Mild scoliosis with curvature to the right, moderate spondylosis    Assessment/Plan: Acute lower back pain without acute neurological findings.  Back pain - Plan: POCT urinalysis dipstick, DG Lumbar Spine 2-3 Views  Lumbosacral spondylosis without myelopathy  Signed, Robyn Haber, MD

## 2014-05-01 NOTE — Patient Instructions (Signed)
You have moderate spinal arthritis which can result in pain from time to time. I've ordered some medicine to try to help you get through this episode. Hopefully this will come back for a long time but it does please return and we'll discuss further

## 2014-07-15 ENCOUNTER — Encounter: Payer: Self-pay | Admitting: Internal Medicine

## 2014-07-15 ENCOUNTER — Ambulatory Visit (INDEPENDENT_AMBULATORY_CARE_PROVIDER_SITE_OTHER): Payer: Medicare Other | Admitting: Internal Medicine

## 2014-07-15 VITALS — BP 156/94 | HR 54 | Temp 98.5°F | Ht 72.0 in | Wt 277.1 lb

## 2014-07-15 DIAGNOSIS — H409 Unspecified glaucoma: Secondary | ICD-10-CM

## 2014-07-15 DIAGNOSIS — I10 Essential (primary) hypertension: Secondary | ICD-10-CM | POA: Diagnosis not present

## 2014-07-15 DIAGNOSIS — E782 Mixed hyperlipidemia: Secondary | ICD-10-CM

## 2014-07-15 DIAGNOSIS — I2510 Atherosclerotic heart disease of native coronary artery without angina pectoris: Secondary | ICD-10-CM

## 2014-07-15 DIAGNOSIS — E119 Type 2 diabetes mellitus without complications: Secondary | ICD-10-CM | POA: Diagnosis not present

## 2014-07-15 DIAGNOSIS — Z0000 Encounter for general adult medical examination without abnormal findings: Secondary | ICD-10-CM | POA: Insufficient documentation

## 2014-07-15 LAB — COMPREHENSIVE METABOLIC PANEL
ALT: 32 U/L (ref 0–53)
AST: 22 U/L (ref 0–37)
Albumin: 4.1 g/dL (ref 3.5–5.2)
Alkaline Phosphatase: 97 U/L (ref 39–117)
BUN: 14 mg/dL (ref 6–23)
CO2: 25 mEq/L (ref 19–32)
Calcium: 9.3 mg/dL (ref 8.4–10.5)
Chloride: 103 mEq/L (ref 96–112)
Creatinine, Ser: 1.5 mg/dL (ref 0.4–1.5)
GFR: 62.32 mL/min (ref >60.00)
Glucose, Bld: 79 mg/dL (ref 70–99)
Potassium: 3.7 mEq/L (ref 3.5–5.1)
Sodium: 135 mEq/L (ref 135–145)
Total Bilirubin: 0.7 mg/dL (ref 0.2–1.2)
Total Protein: 7.4 g/dL (ref 6.0–8.3)

## 2014-07-15 LAB — CBC WITH DIFFERENTIAL/PLATELET
Basophils Absolute: 0 10*3/uL (ref 0.0–0.1)
Basophils Relative: 0.8 % (ref 0.0–3.0)
Eosinophils Absolute: 0.2 10*3/uL (ref 0.0–0.7)
Eosinophils Relative: 2.9 % (ref 0.0–5.0)
HCT: 46.6 % (ref 39.0–52.0)
Hemoglobin: 15.4 g/dL (ref 13.0–17.0)
Lymphocytes Relative: 34.8 % (ref 12.0–46.0)
Lymphs Abs: 1.9 10*3/uL (ref 0.7–4.0)
MCHC: 33.1 g/dL (ref 30.0–36.0)
MCV: 87.3 fl (ref 78.0–100.0)
Monocytes Absolute: 0.7 10*3/uL (ref 0.1–1.0)
Monocytes Relative: 13.1 % — ABNORMAL HIGH (ref 3.0–12.0)
Neutro Abs: 2.7 10*3/uL (ref 1.4–7.7)
Neutrophils Relative %: 48.4 % (ref 43.0–77.0)
Platelets: 322 10*3/uL (ref 150.0–400.0)
RBC: 5.33 Mil/uL (ref 4.22–5.81)
RDW: 14.9 % (ref 11.5–15.5)
WBC: 5.5 10*3/uL (ref 4.0–10.5)

## 2014-07-15 LAB — MICROALBUMIN / CREATININE URINE RATIO
Creatinine,U: 295.9 mg/dL
Microalb Creat Ratio: 3 mg/g (ref 0.0–30.0)
Microalb, Ur: 8.9 mg/dL — ABNORMAL HIGH (ref 0.0–1.9)

## 2014-07-15 LAB — TSH: TSH: 0.74 u[IU]/mL (ref 0.35–4.50)

## 2014-07-15 LAB — HEMOGLOBIN A1C: Hgb A1c MFr Bld: 6.2 % (ref 4.6–6.5)

## 2014-07-15 MED ORDER — AMLODIPINE BESYLATE 10 MG PO TABS: 10.0000 mg | ORAL_TABLET | Freq: Every day | ORAL | Status: DC

## 2014-07-15 NOTE — Patient Instructions (Signed)
Get your blood work before you leave   Check the  blood pressure 2 or 3 times a  week be sure it is between 110/60 and 140/85. Ideal blood pressure is 120/80. If it is consistently higher or lower, let me know  See your eye doctor regularly   Come back to the office in 2 months  fasting Please make an appointment

## 2014-07-15 NOTE — Assessment & Plan Note (Signed)
History of diabetes, last A1c 6.2 in 2013. Plan: Labs, further advice which results

## 2014-07-15 NOTE — Assessment & Plan Note (Addendum)
Recommend to see the  eye MD regularly

## 2014-07-15 NOTE — Progress Notes (Signed)
Pre-visit discussion using our clinic review tool. No additional management support is needed unless otherwise documented below in the visit note.  

## 2014-07-15 NOTE — Assessment & Plan Note (Signed)
Per chart review, Normal cath 2008 Normal nuclear study 2011

## 2014-07-15 NOTE — Assessment & Plan Note (Signed)
Colonoscopy 2011, dr Sharlett Iles, no polyps, next 2021

## 2014-07-15 NOTE — Assessment & Plan Note (Signed)
Not fasting today, will check on return to the office

## 2014-07-15 NOTE — Progress Notes (Signed)
Subjective:    Patient ID: Edward Stafford, male    DOB: Jul 13, 1951, 63 y.o.   MRN: [id number]  DOS:  07/15/2014 Type of visit - description:  New patient  Chart reviewed including the ol electronic medical record system History of diabetes, last A1c was in 2013. History of hypertension, on amlodipine, BP elevated today His her chronic renal insufficiency, last creatinine 1.4 History of " heart trouble" However chart is reviewed and he had a negative cardiac catheterization in 2008 and a negative Myoview in 2011. Currently denies any chest pain. Drug abuse, still using crack cocaine and marijuana "about once a month". History hyperlipidemia, on no medications.    ROS Denies nausea, vomiting, diarrhea or blood in the stools. No chest pain no difficulty breathing , no headaches or dizziness  Past Medical History  Diagnosis Date  . Hypertension   . Back pain, chronic     disabled due to chronic back pain  . Hyperlipidemia   . Chronic kidney disease   . SINUS BRADYCARDIA 05/17/2010  . DIABETES MELLITUS, TYPE II, CONTROLLED 06/08/2008  . Drug abuse-- crack, marihuana 04/19/2007  . GLAUCOMA 12/19/2007  . Chest pain     Cardiac cath (-)2008, Myoview (-) 2011    Past Surgical History  Procedure Laterality Date  . Cholecystectomy      History   Social History  . Marital Status: Single    Spouse Name: N/A    Number of Children: 0  . Years of Education: N/A   Occupational History  . disable     Social History Main Topics  . Smoking status: Former Smoker -- 5 years    Quit date: 04/17/2011  . Smokeless tobacco: Never Used  . Alcohol Use: 0.6 oz/week    1 Cans of beer per week     Comment: occasionally  . Drug Use: Yes    Special: "Crack" cocaine, Marijuana     Comment: drugs "once a month"  . Sexual Activity: Not on file   Other Topics Concern  . Not on file   Social History Narrative   Lives by himself, Does not have a car, problems with transportation       Family History  Problem Relation Age of Onset  . CAD Mother   . Stroke Neg Hx   . Colon cancer Other     cousin  . Prostate cancer Neg Hx       Medication List       This list is accurate as of: 07/15/14  3:39 PM.  Always use your most recent med list.               amLODipine 10 MG tablet  Commonly known as:  NORVASC  Take 1 tablet (10 mg total) by mouth daily.           Objective:   Physical Exam BP 156/94  Pulse 54  Temp(Src) 98.5 F (36.9 C) (Oral)  Ht 6' (1.829 m)  Wt 277 lb 2 oz (125.703 kg)  BMI 37.58 kg/m2  SpO2 97% General -- alert, well-developed, NAD.  Neck --no thyromegaly   HEENT-- Not pale.  Lungs -- normal respiratory effort, no intercostal retractions, no accessory muscle use, and normal breath sounds.  Heart-- normal rate, regular rhythm, no murmur.  Abdomen-- Not distended, good bowel sounds,soft, non-tender. Extremities-- no pretibial edema bilaterally  Neurologic--  alert & oriented X3.   Psych-- Cognition and judgment appear intact. Cooperative with normal attention span and concentration. No  anxious or depressed appearing.      Assessment & Plan:

## 2014-07-15 NOTE — Assessment & Plan Note (Signed)
On amlodipine, well-controlled? BP today is elevated, no ambulatory BPs. Plan: BMP, recommend to check ambulatory BPs, further advice with results

## 2014-07-16 ENCOUNTER — Telehealth: Payer: Self-pay | Admitting: Internal Medicine

## 2014-07-16 NOTE — Telephone Encounter (Signed)
Relevant patient education assigned to patient using Emmi. ° °

## 2014-07-26 MED ORDER — CARVEDILOL 12.5 MG PO TABS: 12.5000 mg | ORAL_TABLET | Freq: Two times a day (BID) | ORAL | Status: DC

## 2014-07-26 NOTE — Addendum Note (Signed)
Addended by: Wilfrid Lund on: 07/26/2014 03:57 PM   Modules accepted: Orders

## 2014-09-01 DIAGNOSIS — H2513 Age-related nuclear cataract, bilateral: Secondary | ICD-10-CM | POA: Diagnosis not present

## 2014-09-01 DIAGNOSIS — H43813 Vitreous degeneration, bilateral: Secondary | ICD-10-CM | POA: Diagnosis not present

## 2014-09-01 DIAGNOSIS — H40023 Open angle with borderline findings, high risk, bilateral: Secondary | ICD-10-CM | POA: Diagnosis not present

## 2014-09-15 ENCOUNTER — Telehealth: Payer: Self-pay | Admitting: *Deleted

## 2014-09-15 ENCOUNTER — Ambulatory Visit: Payer: Medicare Other | Admitting: Internal Medicine

## 2014-09-15 DIAGNOSIS — Z0289 Encounter for other administrative examinations: Secondary | ICD-10-CM

## 2014-09-15 NOTE — Telephone Encounter (Signed)
Pt did not show for appointment 09/15/2014 at 8:30am for 68mo Follow up, Fasting

## 2014-09-29 DIAGNOSIS — H401232 Low-tension glaucoma, bilateral, moderate stage: Secondary | ICD-10-CM | POA: Diagnosis not present

## 2014-09-29 DIAGNOSIS — H11153 Pinguecula, bilateral: Secondary | ICD-10-CM | POA: Diagnosis not present

## 2014-09-29 DIAGNOSIS — H2513 Age-related nuclear cataract, bilateral: Secondary | ICD-10-CM | POA: Diagnosis not present

## 2014-10-01 ENCOUNTER — Encounter: Payer: Self-pay | Admitting: Internal Medicine

## 2014-10-01 ENCOUNTER — Ambulatory Visit (INDEPENDENT_AMBULATORY_CARE_PROVIDER_SITE_OTHER): Payer: Medicare Other | Admitting: Internal Medicine

## 2014-10-01 ENCOUNTER — Ambulatory Visit (INDEPENDENT_AMBULATORY_CARE_PROVIDER_SITE_OTHER): Payer: Medicare Other

## 2014-10-01 VITALS — BP 118/72 | HR 49 | Temp 97.9°F | Wt 276.1 lb

## 2014-10-01 DIAGNOSIS — F1910 Other psychoactive substance abuse, uncomplicated: Secondary | ICD-10-CM

## 2014-10-01 DIAGNOSIS — E782 Mixed hyperlipidemia: Secondary | ICD-10-CM | POA: Diagnosis not present

## 2014-10-01 DIAGNOSIS — I2510 Atherosclerotic heart disease of native coronary artery without angina pectoris: Secondary | ICD-10-CM

## 2014-10-01 DIAGNOSIS — Z23 Encounter for immunization: Secondary | ICD-10-CM

## 2014-10-01 DIAGNOSIS — I10 Essential (primary) hypertension: Secondary | ICD-10-CM

## 2014-10-01 LAB — LIPID PANEL
Cholesterol: 187 mg/dL (ref 0–200)
HDL: 28.1 mg/dL — ABNORMAL LOW (ref >39.00)
LDL Cholesterol: 125 mg/dL — ABNORMAL HIGH (ref 0–99)
NonHDL: 158.9
Total CHOL/HDL Ratio: 7
Triglycerides: 169 mg/dL — ABNORMAL HIGH (ref 0.0–149.0)
VLDL: 33.8 mg/dL (ref 0.0–40.0)

## 2014-10-01 NOTE — Assessment & Plan Note (Signed)
Check an FLP today

## 2014-10-01 NOTE — Patient Instructions (Signed)
Get your blood work before you leave    MEDS: AMLODIPINE 1 TABLET DAILY cARVEDILOL HALF TABLET TWICE A DAY  Check the  blood pressure  weekly  Be sure your blood pressure is between  145/85  and 110/65.  if it is consistently higher or lower, let me know    Please come back to the office IN 4 MONTHS for a routine check up

## 2014-10-01 NOTE — Assessment & Plan Note (Addendum)
Currently on amlodipine and carvedilol 12.5, he is only taking 1 carvedilol tablet in the morning. Pulses 49 but he is asymptomatic Plan: Continue amlodipine Split carvedilol to half tablet twice a day. Patient in agreement. Check ambulatory BPs

## 2014-10-01 NOTE — Assessment & Plan Note (Signed)
I advised him against use of drugs again today

## 2014-10-01 NOTE — Progress Notes (Signed)
   Subjective:    Patient ID: Edward Stafford, male    DOB: 10/18/1951, 63 y.o.   MRN: [id number]  DOS:  10/01/2014 Type of visit - description : f/u Interval history: Hyperlipidemia, fasting, due for labs Hypertension, carvedilol was added, his taking only one tablet daily. No ambulatory BPs. Pulse noted to be low today. Needs a flu shot   ROS Denies chest pain or difficulty breathing No dizziness or fatigue No nausea, vomiting, diarrhea  Past Medical History  Diagnosis Date  . Hypertension   . Back pain, chronic     disabled due to chronic back pain  . Hyperlipidemia   . Chronic kidney disease   . SINUS BRADYCARDIA 05/17/2010  . DIABETES MELLITUS, TYPE II, CONTROLLED 06/08/2008  . Drug abuse-- crack, marihuana 04/19/2007  . GLAUCOMA 12/19/2007  . Chest pain     Cardiac cath (-)2008, Myoview (-) 2011    Past Surgical History  Procedure Laterality Date  . Cholecystectomy      History   Social History  . Marital Status: Single    Spouse Name: N/A    Number of Children: 0  . Years of Education: N/A   Occupational History  . disable     Social History Main Topics  . Smoking status: Former Smoker -- 5 years    Quit date: 04/17/2011  . Smokeless tobacco: Never Used  . Alcohol Use: 0.6 oz/week    1 Cans of beer per week     Comment: occasionally  . Drug Use: Yes    Special: "Crack" cocaine, Marijuana     Comment: drugs "once a month"  . Sexual Activity: Not on file   Other Topics Concern  . Not on file   Social History Narrative   Lives by himself, Does not have a car, problems with transportation        Medication List       This list is accurate as of: 10/01/14  6:15 PM.  Always use your most recent med list.               amLODipine 10 MG tablet  Commonly known as:  NORVASC  Take 1 tablet (10 mg total) by mouth daily.     carvedilol 12.5 MG tablet  Commonly known as:  COREG  Take by mouth. Take 1/2 tablet twice a day             Objective:   Physical Exam BP 118/72 mmHg  Pulse 49  Temp(Src) 97.9 F (36.6 C) (Oral)  Wt 276 lb 2 oz (125.249 kg)  SpO2 98%  General -- alert, well-developed, NAD.   Lungs -- normal respiratory effort, no intercostal retractions, no accessory muscle use, and normal breath sounds.  Heart-- normal rate, regular rhythm, no murmur.   Extremities-- no pretibial edema bilaterally  Neurologic--  alert & oriented X3. Speech normal, gait appropriate for age, strength symmetric and appropriate for age.  Psych-- Cognition and judgment appear intact. Cooperative with normal attention span and concentration. No anxious or depressed appearing.     Assessment & Plan:

## 2014-10-01 NOTE — Progress Notes (Signed)
Pre visit review using our clinic review tool, if applicable. No additional management support is needed unless otherwise documented below in the visit note. 

## 2014-10-05 MED ORDER — ATORVASTATIN CALCIUM 10 MG PO TABS: 10.0000 mg | ORAL_TABLET | Freq: Every day | ORAL | Status: DC

## 2014-10-05 NOTE — Addendum Note (Signed)
Addended by: Wilfrid Lund on: 10/05/2014 02:24 PM   Modules accepted: Orders

## 2014-10-15 ENCOUNTER — Telehealth: Payer: Self-pay | Admitting: Internal Medicine

## 2014-10-15 MED ORDER — PRAVASTATIN SODIUM 20 MG PO TABS: 20.0000 mg | ORAL_TABLET | Freq: Every day | ORAL | Status: DC

## 2014-10-15 NOTE — Telephone Encounter (Signed)
Please advise 

## 2014-10-15 NOTE — Telephone Encounter (Signed)
Spoke with Pt and informed him that Lipitor has been changed to Pravachol, instructed him to begin taking 1 tablet daily. Pt verbalized understanding. Also informed Pt he will need to return for FASTING labs in 6 weeks, Pt stated he would call back to schedule appt.

## 2014-10-15 NOTE — Telephone Encounter (Signed)
Caller name: Devontay Relation to pt: self Call back number: [phone] Pharmacy: [facility] on [address]  Reason for call:   Patient states that lipitor is too expensive for him and would like to know if something else could be called in

## 2014-10-15 NOTE — Telephone Encounter (Signed)
pravachol 20 mg 1 po qhs #30, 3 RF Arrange labs in 6 weeks

## 2014-12-27 ENCOUNTER — Telehealth: Payer: Self-pay | Admitting: Internal Medicine

## 2014-12-27 NOTE — Telephone Encounter (Signed)
Contacted Estill Bamberg w/ [facility], she is aware referral can not be done due to our office not being PCP on insurance card. She will call pt and let him know.

## 2014-12-27 NOTE — Telephone Encounter (Signed)
Caller name:Clewis, Melven Relation to [id number] Call back [place]:  Reason for call: pt is needing a a referral to dr. Leonarda Salon at [facility], states he has an appt on 2/24. Pt has [employer].

## 2014-12-27 NOTE — Telephone Encounter (Signed)
-----   Message from Irven Baltimore sent at 12/22/2014  9:18 AM EST ----- Edward Stafford at [facility] [phone]  Has appointment on 12/29/14 with Dr. Lura Em NPI# [id number] Dx: [phone] normal tension glaucoma

## 2014-12-27 NOTE — Telephone Encounter (Signed)
See previous phone note regarding referral to Dr. Rachael Fee office. Edward Stafford at Dr. Rachael Fee is supposed to inform Pt that referral can not be approved until [name] is listed as PCP on his insurance card.

## 2014-12-27 NOTE — Telephone Encounter (Signed)
This is a [name] pt. Pt has incorrect PCP listed on [employer] card. Please advise pt he needs to have this changed before referral can be approved.

## 2015-01-17 ENCOUNTER — Telehealth: Payer: Self-pay | Admitting: Internal Medicine

## 2015-01-17 NOTE — Telephone Encounter (Signed)
error 

## 2015-01-31 ENCOUNTER — Ambulatory Visit (INDEPENDENT_AMBULATORY_CARE_PROVIDER_SITE_OTHER): Payer: Commercial Managed Care - HMO | Admitting: Internal Medicine

## 2015-01-31 ENCOUNTER — Encounter: Payer: Self-pay | Admitting: Internal Medicine

## 2015-01-31 VITALS — BP 112/68 | HR 49 | Temp 98.2°F | Ht 72.0 in | Wt 274.5 lb

## 2015-01-31 DIAGNOSIS — Z125 Encounter for screening for malignant neoplasm of prostate: Secondary | ICD-10-CM

## 2015-01-31 DIAGNOSIS — E782 Mixed hyperlipidemia: Secondary | ICD-10-CM

## 2015-01-31 DIAGNOSIS — E118 Type 2 diabetes mellitus with unspecified complications: Secondary | ICD-10-CM | POA: Diagnosis not present

## 2015-01-31 DIAGNOSIS — I10 Essential (primary) hypertension: Secondary | ICD-10-CM

## 2015-01-31 DIAGNOSIS — H409 Unspecified glaucoma: Secondary | ICD-10-CM

## 2015-01-31 DIAGNOSIS — Z0000 Encounter for general adult medical examination without abnormal findings: Secondary | ICD-10-CM

## 2015-01-31 NOTE — Assessment & Plan Note (Signed)
We'll arrange a ophthalmology referral

## 2015-01-31 NOTE — Assessment & Plan Note (Signed)
Well-controlled, no change 

## 2015-01-31 NOTE — Assessment & Plan Note (Signed)
Started Pravachol based on the last FLP, check labs, will come back fasting

## 2015-01-31 NOTE — Assessment & Plan Note (Signed)
DRE today negative, check a PSA

## 2015-01-31 NOTE — Progress Notes (Signed)
Pre visit review using our clinic review tool, if applicable. No additional management support is needed unless otherwise documented below in the visit note. 

## 2015-01-31 NOTE — Patient Instructions (Signed)
Please schedule labs to be done within few days (fasting)   Come back to the office in 4 months   For a physical exam  Please schedule an appointment at the front desk    Come back fasting      Increase your physical activity, walk up to 30 minutes daily Will refer you to the foot doctor and eye doctor Continue taking the same medications

## 2015-01-31 NOTE — Assessment & Plan Note (Signed)
Encourage to increase physical activity. Check A1c Recommend an eye  check up Feet exam today: Extremity large nails, dystrophic. Skin very dry. Will refer to podiatry and reassess his skin when he returns to the office.

## 2015-01-31 NOTE — Progress Notes (Addendum)
Subjective:    Patient ID: Laurey Morale, male    DOB: 18-Mar-1951, 64 y.o.   MRN: [id number]  DOS:  01/31/2015 Type of visit - description : rov Interval history: Diabetes, reports diet is okay, not very active. Hypertension, good compliance w/ medication, not ambulatory BPs High cholesterol, started statins, no apparent side effects.    Review of Systems Denies chest pain or shortness of breath No nausea, vomiting. No dysuria, gross hematuria difficulty urinating  Past Medical History  Diagnosis Date  . Hypertension   . Back pain, chronic     disabled due to chronic back pain  . Hyperlipidemia   . Chronic kidney disease   . SINUS BRADYCARDIA 05/17/2010  . DIABETES MELLITUS, TYPE II, CONTROLLED 06/08/2008  . Drug abuse-- crack, marihuana 04/19/2007  . GLAUCOMA 12/19/2007  . Chest pain     Cardiac cath (-)2008, Myoview (-) 2011    Past Surgical History  Procedure Laterality Date  . Cholecystectomy      History   Social History  . Marital Status: Single    Spouse Name: N/A  . Number of Children: 0  . Years of Education: N/A   Occupational History  . disable     Social History Main Topics  . Smoking status: Former Smoker -- 5 years    Quit date: 04/17/2011  . Smokeless tobacco: Never Used  . Alcohol Use: 0.6 oz/week    1 Cans of beer per week     Comment: occasionally  . Drug Use: Yes    Special: "Crack" cocaine, Marijuana     Comment: drugs "once a month"  . Sexual Activity: Not on file   Other Topics Concern  . Not on file   Social History Narrative   Lives by himself, Does not have a car, problems with transportation        Medication List       This list is accurate as of: 01/31/15  9:11 AM.  Always use your most recent med list.               amLODipine 10 MG tablet  Commonly known as:  NORVASC  Take 1 tablet (10 mg total) by mouth daily.     carvedilol 12.5 MG tablet  Commonly known as:  COREG  Take by mouth. Take 1/2 tablet twice  a day     pravastatin 20 MG tablet  Commonly known as:  PRAVACHOL  Take 1 tablet (20 mg total) by mouth daily.           Objective:   Physical Exam BP 112/68 mmHg  Pulse 49  Temp(Src) 98.2 F (36.8 C) (Oral)  Ht 6' (1.829 m)  Wt 274 lb 8 oz (124.512 kg)  BMI 37.22 kg/m2  SpO2 96%  General:   Well developed, well nourished . NAD.  HEENT:  Normocephalic . Face symmetric, atraumatic Lungs:  CTA B Normal respiratory effort, no intercostal retractions, no accessory muscle use. Heart: RRR,  no murmur.  Muscle skeletal: no pretibial edema bilaterally  DRE-- prostate without nodules, soft, nontender DIABETIC FEET EXAM: No lower extremity edema Skin extremely dry but no redness, swelling or discharge Nails dystrophic, very long, some are  2 inches long. Pinprick examination of the feet normal. Neurologic:  alert & oriented X3.  Speech normal, gait appropriate for age and unassisted Psych--  Cognition and judgment appear intact.  Cooperative with normal attention span and concentration.  Behavior appropriate. No anxious or depressed appearing.  Assessment & Plan:

## 2015-02-01 ENCOUNTER — Other Ambulatory Visit (INDEPENDENT_AMBULATORY_CARE_PROVIDER_SITE_OTHER): Payer: Commercial Managed Care - HMO

## 2015-02-01 ENCOUNTER — Encounter: Payer: Self-pay | Admitting: Internal Medicine

## 2015-02-01 DIAGNOSIS — E782 Mixed hyperlipidemia: Secondary | ICD-10-CM | POA: Diagnosis not present

## 2015-02-01 DIAGNOSIS — E118 Type 2 diabetes mellitus with unspecified complications: Secondary | ICD-10-CM

## 2015-02-01 DIAGNOSIS — Z125 Encounter for screening for malignant neoplasm of prostate: Secondary | ICD-10-CM

## 2015-02-01 DIAGNOSIS — H409 Unspecified glaucoma: Secondary | ICD-10-CM

## 2015-02-01 DIAGNOSIS — R972 Elevated prostate specific antigen [PSA]: Secondary | ICD-10-CM

## 2015-02-01 LAB — HEMOGLOBIN A1C: Hgb A1c MFr Bld: 6.1 % (ref 4.6–6.5)

## 2015-02-01 LAB — LIPID PANEL
Cholesterol: 132 mg/dL (ref 0–200)
HDL: 40.3 mg/dL (ref >39.00)
LDL Cholesterol: 60 mg/dL (ref 0–99)
NonHDL: 91.7
Total CHOL/HDL Ratio: 3
Triglycerides: 159 mg/dL — ABNORMAL HIGH (ref 0.0–149.0)
VLDL: 31.8 mg/dL (ref 0.0–40.0)

## 2015-02-01 LAB — ALT: ALT: 22 U/L (ref 0–53)

## 2015-02-01 LAB — MICROALBUMIN / CREATININE URINE RATIO
Creatinine,U: 139.6 mg/dL
Microalb Creat Ratio: 2.1 mg/g (ref 0.0–30.0)
Microalb, Ur: 2.9 mg/dL — ABNORMAL HIGH (ref 0.0–1.9)

## 2015-02-01 LAB — PSA: PSA: 8.6 ng/mL — ABNORMAL HIGH (ref 0.10–4.00)

## 2015-02-01 LAB — AST: AST: 18 U/L (ref 0–37)

## 2015-02-02 NOTE — Addendum Note (Signed)
Addended by: Wilfrid Lund on: 02/02/2015 01:54 PM   Modules accepted: Orders

## 2015-02-04 ENCOUNTER — Encounter: Payer: Self-pay | Admitting: Internal Medicine

## 2015-02-08 ENCOUNTER — Telehealth: Payer: Self-pay | Admitting: *Deleted

## 2015-02-08 NOTE — Telephone Encounter (Signed)
Pt dropped off form for [facility]. Form completed and signed by Dr Larose Kells recommending that pt does not donate plasma due to medical conditions. Called pt to inform him that forms are at our front desk for pick up. Copy sent for scanning. JG//CMA

## 2015-02-16 DIAGNOSIS — H4011X2 Primary open-angle glaucoma, moderate stage: Secondary | ICD-10-CM | POA: Diagnosis not present

## 2015-02-17 DIAGNOSIS — L602 Onychogryphosis: Secondary | ICD-10-CM | POA: Diagnosis not present

## 2015-02-17 DIAGNOSIS — M2041 Other hammer toe(s) (acquired), right foot: Secondary | ICD-10-CM | POA: Diagnosis not present

## 2015-02-17 DIAGNOSIS — M2042 Other hammer toe(s) (acquired), left foot: Secondary | ICD-10-CM | POA: Diagnosis not present

## 2015-02-26 ENCOUNTER — Other Ambulatory Visit: Payer: Self-pay | Admitting: Internal Medicine

## 2015-03-24 ENCOUNTER — Other Ambulatory Visit: Payer: Self-pay

## 2015-05-05 ENCOUNTER — Telehealth: Payer: Self-pay | Admitting: Internal Medicine

## 2015-05-05 DIAGNOSIS — R972 Elevated prostate specific antigen [PSA]: Secondary | ICD-10-CM

## 2015-05-05 NOTE — Telephone Encounter (Signed)
Please contact the patient, PSA was elevated, did he see urology? If he did , please get the report

## 2015-05-06 NOTE — Telephone Encounter (Signed)
LMOM Informing Pt to return call.

## 2015-05-06 NOTE — Telephone Encounter (Signed)
Spoke with Edward Stafford at [facility], Pt has not been seen by them since 2014.

## 2015-05-06 NOTE — Telephone Encounter (Signed)
Please enter a new referral, then call pt and advise I rec to see urology

## 2015-05-09 NOTE — Telephone Encounter (Signed)
Unable to contact Pt via telephone, letter printed and mailed to Pt informing Pt that [name] recommends him to see his urologist. Informed him that we have placed a new referral that he will be contacted to schedule an appt or if he would like he can call [facility] to schedule an appt.

## 2015-05-13 ENCOUNTER — Encounter: Payer: Self-pay | Admitting: Internal Medicine

## 2015-06-23 ENCOUNTER — Encounter: Payer: Self-pay | Admitting: Internal Medicine

## 2015-08-26 ENCOUNTER — Other Ambulatory Visit: Payer: Self-pay

## 2015-08-26 MED ORDER — CARVEDILOL 12.5 MG PO TABS: 12.5000 mg | ORAL_TABLET | Freq: Two times a day (BID) | ORAL | Status: DC

## 2015-08-30 ENCOUNTER — Encounter: Payer: Self-pay | Admitting: Internal Medicine

## 2015-08-30 ENCOUNTER — Ambulatory Visit (INDEPENDENT_AMBULATORY_CARE_PROVIDER_SITE_OTHER): Payer: Commercial Managed Care - HMO | Admitting: Internal Medicine

## 2015-08-30 VITALS — BP 134/66 | HR 49 | Temp 98.0°F | Ht 72.0 in | Wt 275.1 lb

## 2015-08-30 DIAGNOSIS — R972 Elevated prostate specific antigen [PSA]: Secondary | ICD-10-CM | POA: Diagnosis not present

## 2015-08-30 DIAGNOSIS — Z09 Encounter for follow-up examination after completed treatment for conditions other than malignant neoplasm: Secondary | ICD-10-CM

## 2015-08-30 DIAGNOSIS — Z1159 Encounter for screening for other viral diseases: Secondary | ICD-10-CM

## 2015-08-30 DIAGNOSIS — Z114 Encounter for screening for human immunodeficiency virus [HIV]: Secondary | ICD-10-CM | POA: Diagnosis not present

## 2015-08-30 DIAGNOSIS — Z0000 Encounter for general adult medical examination without abnormal findings: Secondary | ICD-10-CM | POA: Diagnosis not present

## 2015-08-30 DIAGNOSIS — Z23 Encounter for immunization: Secondary | ICD-10-CM

## 2015-08-30 DIAGNOSIS — Z0100 Encounter for examination of eyes and vision without abnormal findings: Secondary | ICD-10-CM

## 2015-08-30 DIAGNOSIS — I10 Essential (primary) hypertension: Secondary | ICD-10-CM | POA: Diagnosis not present

## 2015-08-30 DIAGNOSIS — E119 Type 2 diabetes mellitus without complications: Secondary | ICD-10-CM | POA: Diagnosis not present

## 2015-08-30 LAB — HIV ANTIBODY (ROUTINE TESTING W REFLEX): HIV 1&2 Ab, 4th Generation: NONREACTIVE

## 2015-08-30 MED ORDER — AMLODIPINE BESYLATE 10 MG PO TABS: 10.0000 mg | ORAL_TABLET | Freq: Every day | ORAL | Status: DC

## 2015-08-30 MED ORDER — PRAVASTATIN SODIUM 20 MG PO TABS: 20.0000 mg | ORAL_TABLET | Freq: Every day | ORAL | Status: DC

## 2015-08-30 MED ORDER — ZOSTER VACCINE LIVE 19400 UNT/0.65ML ~~LOC~~ SOLR: 0.6500 mL | Freq: Once | SUBCUTANEOUS | Status: DC

## 2015-08-30 MED ORDER — CARVEDILOL 12.5 MG PO TABS: 12.5000 mg | ORAL_TABLET | Freq: Two times a day (BID) | ORAL | Status: DC

## 2015-08-30 NOTE — Progress Notes (Signed)
Subjective:    Patient ID: Edward Stafford, male    DOB: 1950-11-28, 64 y.o.   MRN: [id number]  DOS:  08/30/2015 Type of visit - description :  Here for Medicare AWV:  1. Risk factors based on Past M, S, F history: reviewed 2. Physical Activities: active, no routine exercise  3. Depression/mood: neg screening  4. Hearing:  No problems noted or reported  5. ADL's: independent, drives  6. Fall Risk: no recent falls, prevention discussed , see AVS 7. home Safety: does feel safe at home  8. Height, weight, & visual acuity: see VS, has not seen the eye doctor in a while 9. Counseling: provided 10. Labs ordered based on risk factors: if needed  11. Referral Coordination: if needed 12. Care Plan, see assessment and plan , written personalized plan provided , see AVS 13. Cognitive Assessment: motor skills and cognition appropriate for age 5. Care team updated (does not see other MDs regulalrly) 15. End-of-life care discussed, printed material provided   In addition, today we discussed the following: Hypertension: Good ompliance of medication. BP today is very good, no recent ambulatory BPs High cholesterol:good compliance of medication except for the last 2 days.  Elevated PSA: He remains asymptomtic, has not seen urology.    Review of Systems Constitutional: No fever. No chills. No unexplained wt changes. No unusual sweats  HEENT: No dental problems, no ear discharge, no facial swelling, no voice changes. No eye discharge, no eye  redness , no  intolerance to light   Respiratory: No wheezing , no  difficulty breathing. No cough , no mucus production  Cardiovascular: No CP, no leg swelling , no  Palpitations  GI: no nausea, no vomiting, no diarrhea , no  abdominal pain.  No blood in the stools. No dysphagia, no odynophagia    Endocrine: No polyphagia, no polyuria , no polydipsia  GU: No dysuria, gross hematuria, difficulty urinating. No urinary urgency, no  frequency.  Musculoskeletal: No joint swellings or unusual aches or pains  Skin: No change in the color of the skin, palor , no  Rash  Allergic, immunologic: No environmental allergies , no  food allergies  Neurological: No dizziness no  syncope. No headaches. No diplopia, no slurred, no slurred speech, no motor deficits, no facial  Numbness  Hematological: No enlarged lymph nodes, no easy bruising , no unusual bleedings  Psychiatry: No suicidal ideas, no hallucinations, no beavior problems, no confusion.  No unusual/severe anxiety, no depression   Past Medical History  Diagnosis Date  . Hypertension   . Back pain, chronic     disabled due to chronic back pain  . Hyperlipidemia   . Chronic kidney disease   . SINUS BRADYCARDIA 05/17/2010  . DIABETES MELLITUS, TYPE II, CONTROLLED 06/08/2008  . Drug abuse-- crack, marihuana 04/19/2007  . GLAUCOMA 12/19/2007  . Chest pain     Cardiac cath (-)2008, Myoview (-) 2011    Past Surgical History  Procedure Laterality Date  . Cholecystectomy      Social History   Social History  . Marital Status: Single    Spouse Name: N/A  . Number of Children: 0  . Years of Education: N/A   Occupational History  . disable     Social History Main Topics  . Smoking status: Former Smoker -- 5 years    Quit date: 04/17/2011  . Smokeless tobacco: Never Used  . Alcohol Use: 0.6 oz/week    1 Cans of beer per week  Comment: occasionally  . Drug Use: Yes    Special: "Crack" cocaine, Marijuana     Comment: denies since ~ 2014  . Sexual Activity: Not on file   Other Topics Concern  . Not on file   Social History Narrative   Lives by himself, has a car     Family History  Problem Relation Age of Onset  . CAD Mother     age onset ?  . Stroke Neg Hx   . Colon cancer Other     cousin  . Prostate cancer Neg Hx       Medication List       This list is accurate as of: 08/30/15 11:59 PM.  Always use your most recent med list.                amLODipine 10 MG tablet  Commonly known as:  NORVASC  Take 1 tablet (10 mg total) by mouth daily.     carvedilol 12.5 MG tablet  Commonly known as:  COREG  Take 1 tablet (12.5 mg total) by mouth 2 (two) times daily with a meal.     pravastatin 20 MG tablet  Commonly known as:  PRAVACHOL  Take 1 tablet (20 mg total) by mouth daily.     zoster vaccine live (PF) 19400 UNT/0.65ML injection  Commonly known as:  ZOSTAVAX  Inject 19,400 Units into the skin once.           Objective:   Physical Exam BP 134/66 mmHg  Pulse 49  Temp(Src) 98 F (36.7 C) (Oral)  Ht 6' (1.829 m)  Wt 275 lb 2 oz (124.796 kg)  BMI 37.31 kg/m2  SpO2 97% General:   Well developed, well nourished . NAD.  Neck:  Full range of motion.  no  Thyromegaly  HEENT:  Normocephalic . Face symmetric, atraumatic Lungs:  CTA B Normal respiratory effort, no intercostal retractions, no accessory muscle use. Heart: RRR,  no murmur.  No pretibial edema bilaterally  Abdomen:  Not distended, soft, non-tender. No rebound or rigidity. No mass,organomegaly Rectal:  External abnormalities: none. Normal sphincter tone. No rectal masses or tenderness.  Stool brown  Prostate: Prostate gland firm and smooth, no enlargement, nodularity, tenderness, mass, asymmetry or induration.  Neurologic:  alert & oriented X3.  Speech normal, gait appropriate for age and unassisted Strength symmetric and appropriate for age.  Psych: Cognition and judgment appear intact.  Cooperative with normal attention span and concentration.  Behavior appropriate. No anxious or depressed appearing.    Assessment & Plan:   Assessment > Diabetes HTN Hyperlipidemia CKD Elevated PSA : 8.6 (01-2015) H/o  Drug abuse, crack, marijuana  Chronic back pain, disabled  Glaucoma H/o CP Cath 2008 and Myoview 2011 negative  Plan  diabetes: Check A1c, diet and exercise discussed . refefr to ophtalmology HTN: Seems control, check a CMP and  CBC CKD: Labs, rec to avoid NSAIDs RTC 6 months

## 2015-08-30 NOTE — Patient Instructions (Signed)
Get your blood work before you leave      Next visit  for a routine checkup in 6 months     (30 minutes) Please schedule an appointment at the front desk Please come back fasting   Fall Prevention and [place] cause injuries and can affect all age groups. It is possible to use preventive measures to significantly decrease the likelihood of falls. There are many simple measures which can make your home safer and prevent falls. OUTDOORS  Repair cracks and edges of walkways and driveways.  Remove high doorway thresholds.  Trim shrubbery on the main path into your home.  Have good outside lighting.  Clear walkways of tools, rocks, debris, and clutter.  Check that handrails are not broken and are securely fastened. Both sides of steps should have handrails.  Have leaves, snow, and ice cleared regularly.  Use sand or salt on walkways during winter months.  In the garage, clean up grease or oil spills. BATHROOM  Install night lights.  Install grab bars by the toilet and in the tub and shower.  Use non-skid mats or decals in the tub or shower.  Place a plastic non-slip stool in the shower to sit on, if needed.  Keep floors dry and clean up all water on the floor immediately.  Remove soap buildup in the tub or shower on a regular basis.  Secure bath mats with non-slip, double-sided rug tape.  Remove throw rugs and tripping hazards from the floors. BEDROOMS  Install night lights.  Make sure a bedside light is easy to reach.  Do not use oversized bedding.  Keep a telephone by your bedside.  Have a firm chair with side arms to use for getting dressed.  Remove throw rugs and tripping hazards from the floor. KITCHEN  Keep handles on pots and pans turned toward the center of the stove. Use back burners when possible.  Clean up spills quickly and allow time for drying.  Avoid walking on wet floors.  Avoid hot utensils and knives.  Position shelves so they  are not too high or low.  Place commonly used objects within easy reach.  If necessary, use a sturdy step stool with a grab bar when reaching.  Keep electrical cables out of the way.  Do not use floor polish or wax that makes floors slippery. If you must use wax, use non-skid floor wax.  Remove throw rugs and tripping hazards from the floor. STAIRWAYS  Never leave objects on stairs.  Place handrails on both sides of stairways and use them. Fix any loose handrails. Make sure handrails on both sides of the stairways are as long as the stairs.  Check carpeting to make sure it is firmly attached along stairs. Make repairs to worn or loose carpet promptly.  Avoid placing throw rugs at the top or bottom of stairways, or properly secure the rug with carpet tape to prevent slippage. Get rid of throw rugs, if possible.  Have an electrician put in a light switch at the top and bottom of the stairs. OTHER FALL PREVENTION TIPS  Wear low-heel or rubber-soled shoes that are supportive and fit well. Wear closed toe shoes.  When using a stepladder, make sure it is fully opened and both spreaders are firmly locked. Do not climb a closed stepladder.  Add color or contrast paint or tape to grab bars and handrails in your home. Place contrasting color strips on first and last steps.  Learn and use mobility  aids as needed. Install an electrical emergency response system.  Turn on lights to avoid dark areas. Replace light bulbs that burn out immediately. Get light switches that glow.  Arrange furniture to create clear pathways. Keep furniture in the same place.  Firmly attach carpet with non-skid or double-sided tape.  Eliminate uneven floor surfaces.  Select a carpet pattern that does not visually hide the edge of steps.  Be aware of all pets. OTHER HOME SAFETY TIPS  Set the water temperature for 120 F (48.8 C).  Keep emergency numbers on or near the telephone.  Keep smoke detectors on  every level of the home and near sleeping areas. Document Released: 11/02/2002 Document Revised: 05/13/2012 Document Reviewed: 02/01/2012 [facility] Patient Information 2015 [place], [place]. This information is not intended to replace advice given to you by your health care provider. Make sure you discuss any questions you have with your health care provider.   Preventive Care for Adults Ages 51 and over  Blood pressure check.** / Every 1 to 2 years.  Lipid and cholesterol check.**/ Every 5 years beginning at age 35.  Lung cancer screening. / Every year if you are aged 55-80 years and have a 30-pack-year history of smoking and currently smoke or have quit within the past 15 years. Yearly screening is stopped once you have quit smoking for at least 15 years or develop a health problem that would prevent you from having lung cancer treatment.  Fecal occult blood test (FOBT) of stool. / Every year beginning at age 31 and continuing until age 20. You may not have to do this test if you get a colonoscopy every 10 years.  Flexible sigmoidoscopy** or colonoscopy.** / Every 5 years for a flexible sigmoidoscopy or every 10 years for a colonoscopy beginning at age 62 and continuing until age 77.  Hepatitis C blood test.** / For all people born from [birth year] through 1965 and any individual with known risks for hepatitis C.  Abdominal aortic aneurysm (AAA) screening.** / A one-time screening for ages 16 to 33 years who are current or former smokers.  Skin self-exam. / Monthly.  Influenza vaccine. / Every year.  Tetanus, diphtheria, and acellular pertussis (Tdap/Td) vaccine.** / 1 dose of Td every 10 years.  Varicella vaccine.** / Consult your health care provider.  Zoster vaccine.** / 1 dose for adults aged 67 years or older.  Pneumococcal 13-valent conjugate (PCV13) vaccine.** / Consult your health care provider.  Pneumococcal polysaccharide (PPSV23) vaccine.** / 1 dose for all adults aged 18 years  and older.  Meningococcal vaccine.** / Consult your health care provider.  Hepatitis A vaccine.** / Consult your health care provider.  Hepatitis B vaccine.** / Consult your health care provider.  Haemophilus influenzae type b (Hib) vaccine.** / Consult your health care provider. **Family history and personal history of risk and conditions may change your health care provider's recommendations. Document Released: 01/08/2002 Document Revised: 11/17/2013 Document Reviewed: 04/09/2011 [facility] Patient Information 2015 [place], [place]. This information is not intended to replace advice given to you by your health care provider. Make sure you discuss any questions you have with your health care provider.

## 2015-08-30 NOTE — Progress Notes (Signed)
Pre visit review using our clinic review tool, if applicable. No additional management support is needed unless otherwise documented below in the visit note. 

## 2015-08-30 NOTE — Assessment & Plan Note (Signed)
Td 2008, pnm 23 :2011; prevnar due, not available today, flu shot today rx zoster provided  CCS: Cscope 2011, Dr Sharlett Iles, normal , 10 years  Prostate cancer screening: DRE 6 months ago and today normal. Was referred to urology but did not go.Plan: Repeat PSA. Insist on a urology referral if PSA still elevated. Importance of f/u w/ urology if needed discussed   Labs-- hep C, HIV diet and exercise discussed

## 2015-08-31 LAB — HEPATITIS C ANTIBODY: HCV Ab: NEGATIVE

## 2015-08-31 LAB — CBC WITH DIFFERENTIAL/PLATELET
Basophils Absolute: 0.1 10*3/uL (ref 0.0–0.1)
Basophils Relative: 2.1 % (ref 0.0–3.0)
Eosinophils Absolute: 0.2 10*3/uL (ref 0.0–0.7)
Eosinophils Relative: 4 % (ref 0.0–5.0)
HCT: 44.5 % (ref 39.0–52.0)
Hemoglobin: 14.9 g/dL (ref 13.0–17.0)
Lymphocytes Relative: 37.1 % (ref 12.0–46.0)
Lymphs Abs: 2.1 10*3/uL (ref 0.7–4.0)
MCHC: 33.5 g/dL (ref 30.0–36.0)
MCV: 85.7 fl (ref 78.0–100.0)
Monocytes Absolute: 0.7 10*3/uL (ref 0.1–1.0)
Monocytes Relative: 13.1 % — ABNORMAL HIGH (ref 3.0–12.0)
Neutro Abs: 2.5 10*3/uL (ref 1.4–7.7)
Neutrophils Relative %: 43.7 % (ref 43.0–77.0)
Platelets: 273 10*3/uL (ref 150.0–400.0)
RBC: 5.19 Mil/uL (ref 4.22–5.81)
RDW: 14.5 % (ref 11.5–15.5)
WBC: 5.7 10*3/uL (ref 4.0–10.5)

## 2015-08-31 LAB — COMPREHENSIVE METABOLIC PANEL
ALT: 20 U/L (ref 0–53)
AST: 19 U/L (ref 0–37)
Albumin: 4.2 g/dL (ref 3.5–5.2)
Alkaline Phosphatase: 103 U/L (ref 39–117)
BUN: 15 mg/dL (ref 6–23)
CO2: 26 mEq/L (ref 19–32)
Calcium: 9.4 mg/dL (ref 8.4–10.5)
Chloride: 104 mEq/L (ref 96–112)
Creatinine, Ser: 1.55 mg/dL — ABNORMAL HIGH (ref 0.40–1.50)
GFR: 58.41 mL/min — ABNORMAL LOW (ref >60.00)
Glucose, Bld: 99 mg/dL (ref 70–99)
Potassium: 4 mEq/L (ref 3.5–5.1)
Sodium: 138 mEq/L (ref 135–145)
Total Bilirubin: 0.7 mg/dL (ref 0.2–1.2)
Total Protein: 7.5 g/dL (ref 6.0–8.3)

## 2015-08-31 LAB — HEMOGLOBIN A1C: Hgb A1c MFr Bld: 6 % (ref 4.6–6.5)

## 2015-08-31 LAB — PSA: PSA: 8.66 ng/mL — ABNORMAL HIGH (ref 0.10–4.00)

## 2015-09-01 DIAGNOSIS — Z09 Encounter for follow-up examination after completed treatment for conditions other than malignant neoplasm: Secondary | ICD-10-CM | POA: Insufficient documentation

## 2015-09-01 NOTE — Assessment & Plan Note (Signed)
diabetes: Check A1c, diet and exercise discussed . refefr to ophtalmology HTN: Seems control, check a CMP and CBC CKD: Labs, rec to avoid NSAIDs RTC 6 months

## 2015-09-06 ENCOUNTER — Encounter: Payer: Self-pay | Admitting: Internal Medicine

## 2015-11-24 ENCOUNTER — Other Ambulatory Visit: Payer: Self-pay | Admitting: Internal Medicine

## 2015-11-25 ENCOUNTER — Telehealth: Payer: Self-pay

## 2015-11-25 NOTE — Telephone Encounter (Signed)
-----   Message from Colon Branch, MD sent at 11/24/2015  5:46 PM EST ----- Regarding: Send a letter We got a letter from urology, they report that you did not show to see the doctor  in 3 opportunities. [place], the reason we referred you to urology is because your PSA is elevated and there is a possibility that represents prostate cancer. If you reconsider your decision to see urology, please let us know

## 2015-11-25 NOTE — Telephone Encounter (Signed)
Letter printed and mailed to Pt.  

## 2016-02-06 ENCOUNTER — Other Ambulatory Visit: Payer: Self-pay | Admitting: Internal Medicine

## 2016-02-29 ENCOUNTER — Other Ambulatory Visit: Payer: Self-pay | Admitting: Internal Medicine

## 2016-04-10 ENCOUNTER — Other Ambulatory Visit: Payer: Self-pay | Admitting: Internal Medicine

## 2016-05-17 ENCOUNTER — Other Ambulatory Visit: Payer: Self-pay | Admitting: Internal Medicine

## 2016-05-23 ENCOUNTER — Other Ambulatory Visit: Payer: Self-pay | Admitting: Internal Medicine

## 2016-06-03 ENCOUNTER — Other Ambulatory Visit: Payer: Self-pay | Admitting: Internal Medicine

## 2016-06-04 ENCOUNTER — Telehealth: Payer: Self-pay | Admitting: Internal Medicine

## 2016-06-04 NOTE — Telephone Encounter (Signed)
Medication Detail       Disp Refills Start End      amLODipine (NORVASC) 10 MG tablet 15 tablet 0 06/04/2016      Sig - Route: Take 1 tablet (10 mg total) by mouth daily. - Oral     Notes to Pharmacy: 1/2 month supply only, needs appt     E-Prescribing Status: Receipt confirmed by pharmacy (06/04/2016 8:19 AM EDT)     Medication Detail       Disp Refills Start End      pravastatin (PRAVACHOL) 20 MG tablet 15 tablet 0 06/04/2016      Sig - Route: Take 1 tablet (20 mg total) by mouth daily. - Oral     Notes to Pharmacy: 1/2 month supply only, needs appt     E-Prescribing Status: Receipt confirmed by pharmacy (06/04/2016 8:19 AM EDT)     Both medications were sent for 15 tablets earlier this AM. Further refills w/ labs.

## 2016-06-04 NOTE — Telephone Encounter (Signed)
Pt is requesting a refill on 2 medications. 1. Amlodipine  2. Pravastatin  - Pt says that he didn't know to call in to schedule an appt, and was advised by pharmacy. Pt would like to know, considering the medications if PCP is willing to provide him with a few meds until his visit which is scheduled for Wednesday     Pharmacy: [id number] - [place], [place].

## 2016-06-04 NOTE — Telephone Encounter (Deleted)
amLODipine (NORVASC) 10 MG tablet (Discontinued) 90 tablet 0 02/29/2016 06/03/2016      Sig - Route: Take 1 tablet (10 mg total) by mouth daily. - Oral     Notes to Pharmacy: Requested drug refills are authorized, however, the patient needs further evaluation and/or laboratory testing before further refills are given. Ask him to make an appointment for this.     Reason for Discontinue: Reorder     E-Prescribing Status: Receipt confirmed by pharmacy (02/29/2016 11:25 AM EDT)

## 2016-06-06 ENCOUNTER — Encounter: Payer: Self-pay | Admitting: Internal Medicine

## 2016-06-06 ENCOUNTER — Ambulatory Visit (INDEPENDENT_AMBULATORY_CARE_PROVIDER_SITE_OTHER): Payer: Commercial Managed Care - HMO | Admitting: Internal Medicine

## 2016-06-06 VITALS — BP 128/76 | HR 49 | Temp 98.7°F | Ht 72.0 in | Wt 281.1 lb

## 2016-06-06 DIAGNOSIS — Z23 Encounter for immunization: Secondary | ICD-10-CM

## 2016-06-06 DIAGNOSIS — I10 Essential (primary) hypertension: Secondary | ICD-10-CM | POA: Diagnosis not present

## 2016-06-06 DIAGNOSIS — N182 Chronic kidney disease, stage 2 (mild): Secondary | ICD-10-CM

## 2016-06-06 MED ORDER — PRAVASTATIN SODIUM 20 MG PO TABS: 20.0000 mg | ORAL_TABLET | Freq: Every day | ORAL | Status: DC

## 2016-06-06 MED ORDER — CARVEDILOL 12.5 MG PO TABS: 12.5000 mg | ORAL_TABLET | Freq: Two times a day (BID) | ORAL | Status: DC

## 2016-06-06 MED ORDER — AMLODIPINE BESYLATE 10 MG PO TABS: 10.0000 mg | ORAL_TABLET | Freq: Every day | ORAL | Status: DC

## 2016-06-06 NOTE — Progress Notes (Signed)
Subjective:    Patient ID: Edward Stafford, male    DOB: 1951-02-19, 65 y.o.   MRN: [id number]  DOS:  06/06/2016 Type of visit - description : rov Interval history:  HTN: Good compliance of medication, not ambulatory BPs Complaining of erectile dysfunction for 3 months. Elevated PSA: Failed referral to urology   Review of Systems No chest pain or difficulty breathing No dysuria , gross hematuria or difficulty urinating  Past Medical History  Diagnosis Date  . Hypertension   . Back pain, chronic     disabled due to chronic back pain  . Hyperlipidemia   . Chronic kidney disease   . SINUS BRADYCARDIA 05/17/2010  . DIABETES MELLITUS, TYPE II, CONTROLLED 06/08/2008  . Drug abuse-- crack, marihuana 04/19/2007  . GLAUCOMA 12/19/2007  . Chest pain     Cardiac cath (-)2008, Myoview (-) 2011    Past Surgical History  Procedure Laterality Date  . Cholecystectomy      Social History   Social History  . Marital Status: Single    Spouse Name: N/A  . Number of Children: 0  . Years of Education: N/A   Occupational History  . disable     Social History Main Topics  . Smoking status: Former Smoker -- 5 years    Quit date: 04/17/2011  . Smokeless tobacco: Never Used  . Alcohol Use: 0.6 oz/week    1 Cans of beer per week     Comment: occasionally  . Drug Use: Yes    Special: "Crack" cocaine, Marijuana     Comment: denies since ~ 2014  . Sexual Activity: Not on file   Other Topics Concern  . Not on file   Social History Narrative   Lives by himself, has a car        Medication List       This list is accurate as of: 06/06/16 11:59 PM.  Always use your most recent med list.               amLODipine 10 MG tablet  Commonly known as:  NORVASC  Take 1 tablet (10 mg total) by mouth daily.     carvedilol 12.5 MG tablet  Commonly known as:  COREG  Take 1 tablet (12.5 mg total) by mouth 2 (two) times daily with a meal.     pravastatin 20 MG tablet  Commonly  known as:  PRAVACHOL  Take 1 tablet (20 mg total) by mouth daily.           Objective:   Physical Exam BP 128/76 mmHg  Pulse 49  Temp(Src) 98.7 F (37.1 C) (Oral)  Ht 6' (1.829 m)  Wt 281 lb 2 oz (127.517 kg)  BMI 38.12 kg/m2  SpO2 97% General:   Well developed, well nourished . NAD.  HEENT:  Normocephalic . Face symmetric, atraumatic Lungs:  CTA B Normal respiratory effort, no intercostal retractions, no accessory muscle use. Heart: RRR,  no murmur.  No pretibial edema bilaterally  Skin: Not pale. Not jaundice Neurologic:  alert & oriented X3.  Speech normal, gait appropriate for age and unassisted Psych--  Cognition and judgment appear intact.  Cooperative with normal attention span and concentration.  Behavior appropriate. No anxious or depressed appearing.      Assessment & Plan:   Assessment > Diabetes HTN Hyperlipidemia CKD Glaucoma Elevated PSA : 8.6  2, did not show for urology referral 3 as of 05-2016 Chronic back pain, disabled   H/o  Drug abuse, crack, marijuana  H/o CP Cath 2008 and Myoview 2011 negative  PLAN: CKD: Check a BMP HTN: Good med compliance, not ambulatory BPs, BP today is very good Elevated PSA: failed referral to urology x 3. In no uncertain terms I told the patient he could have cancer and the sooner a dx is established the better he can be treated. Encouraged him to call urology.  1 care-- prevnar today RTC 08-2016 CPX

## 2016-06-06 NOTE — Progress Notes (Signed)
Pre visit review using our clinic review tool, if applicable. No additional management support is needed unless otherwise documented below in the visit note. 

## 2016-06-06 NOTE — Patient Instructions (Signed)
GO TO THE LAB : Get the blood work     GO TO THE FRONT DESK Schedule your next appointment for a  physical exam by October 2017   It is important you see the urologist regards the elevated PSA, you could have prostate cancer.  Please call them at your earliest convenience:  [phone]

## 2016-06-07 LAB — BASIC METABOLIC PANEL
BUN: 11 mg/dL (ref 6–23)
CO2: 28 mEq/L (ref 19–32)
Calcium: 9.2 mg/dL (ref 8.4–10.5)
Chloride: 98 mEq/L (ref 96–112)
Creatinine, Ser: 1.48 mg/dL (ref 0.40–1.50)
GFR: 61.46 mL/min (ref >60.00)
Glucose, Bld: 108 mg/dL — ABNORMAL HIGH (ref 70–99)
Potassium: 4 mEq/L (ref 3.5–5.1)
Sodium: 143 mEq/L (ref 135–145)

## 2016-06-07 NOTE — Assessment & Plan Note (Signed)
CKD: Check a BMP HTN: Good med compliance, not ambulatory BPs, BP today is very good Elevated PSA: failed referral to urology x 3. In no uncertain terms I told the patient he could have cancer and the sooner a dx is established the better he can be treated. Encouraged him to call urology.  1 care-- prevnar today RTC 08-2016 CPX

## 2016-06-18 ENCOUNTER — Telehealth: Payer: Self-pay | Admitting: Internal Medicine

## 2016-06-18 DIAGNOSIS — R972 Elevated prostate specific antigen [PSA]: Secondary | ICD-10-CM

## 2016-06-18 NOTE — Telephone Encounter (Signed)
Relation to [id number] Call back number:[phone]   Reason for call:  Patient requesting a referral to Dr. Sherren Kerns. Louis Meckel, MD urology.

## 2016-06-18 NOTE — Telephone Encounter (Signed)
Chart reviewed, Pt is an established Pt of Dr. Louis Meckel for elevated PSA. Referral placed.

## 2016-07-27 ENCOUNTER — Other Ambulatory Visit: Payer: Self-pay

## 2016-07-27 MED ORDER — AMLODIPINE BESYLATE 10 MG PO TABS: 10.0000 mg | ORAL_TABLET | Freq: Every day | ORAL | 1 refills | Status: DC

## 2016-07-27 MED ORDER — CARVEDILOL 12.5 MG PO TABS: 12.5000 mg | ORAL_TABLET | Freq: Two times a day (BID) | ORAL | 1 refills | Status: DC

## 2016-07-27 MED ORDER — PRAVASTATIN SODIUM 20 MG PO TABS: 20.0000 mg | ORAL_TABLET | Freq: Every day | ORAL | 1 refills | Status: DC

## 2016-09-10 ENCOUNTER — Ambulatory Visit (INDEPENDENT_AMBULATORY_CARE_PROVIDER_SITE_OTHER): Payer: Commercial Managed Care - HMO | Admitting: Internal Medicine

## 2016-09-10 ENCOUNTER — Encounter: Payer: Self-pay | Admitting: Internal Medicine

## 2016-09-10 VITALS — BP 130/78 | HR 43 | Temp 97.7°F | Resp 14 | Ht 72.0 in | Wt 281.2 lb

## 2016-09-10 DIAGNOSIS — R972 Elevated prostate specific antigen [PSA]: Secondary | ICD-10-CM

## 2016-09-10 DIAGNOSIS — Z23 Encounter for immunization: Secondary | ICD-10-CM

## 2016-09-10 DIAGNOSIS — E118 Type 2 diabetes mellitus with unspecified complications: Secondary | ICD-10-CM | POA: Diagnosis not present

## 2016-09-10 DIAGNOSIS — E785 Hyperlipidemia, unspecified: Secondary | ICD-10-CM

## 2016-09-10 DIAGNOSIS — Z0000 Encounter for general adult medical examination without abnormal findings: Secondary | ICD-10-CM

## 2016-09-10 DIAGNOSIS — I10 Essential (primary) hypertension: Secondary | ICD-10-CM | POA: Diagnosis not present

## 2016-09-10 MED ORDER — LOSARTAN POTASSIUM 25 MG PO TABS: 25.0000 mg | ORAL_TABLET | Freq: Every day | ORAL | 0 refills | Status: DC

## 2016-09-10 NOTE — Patient Instructions (Signed)
GO TO THE FRONT DESK  Please schedule a nurse visit 2 weeks from today for a BP check and labs (fasting) Routine visit with me in 3 months from today   Stop carvedilol Start losartan 25 mg every day Other medications the same   Check the  blood pressure 2 or 3 times a  week   Be sure your blood pressure is between 110/65 and  145/85. If it is consistently higher or lower, let me know     Fall Prevention and Home Safety Falls cause injuries and can affect all age groups. It is possible to use preventive measures to significantly decrease the likelihood of falls. There are many simple measures which can make your home safer and prevent falls. OUTDOORS  Repair cracks and edges of walkways and driveways.  Remove high doorway thresholds.  Trim shrubbery on the main path into your home.  Have good outside lighting.  Clear walkways of tools, rocks, debris, and clutter.  Check that handrails are not broken and are securely fastened. Both sides of steps should have handrails.  Have leaves, snow, and ice cleared regularly.  Use sand or salt on walkways during winter months.  In the garage, clean up grease or oil spills. BATHROOM  Install night lights.  Install grab bars by the toilet and in the tub and shower.  Use non-skid mats or decals in the tub or shower.  Place a plastic non-slip stool in the shower to sit on, if needed.  Keep floors dry and clean up all water on the floor immediately.  Remove soap buildup in the tub or shower on a regular basis.  Secure bath mats with non-slip, double-sided rug tape.  Remove throw rugs and tripping hazards from the floors. BEDROOMS  Install night lights.  Make sure a bedside light is easy to reach.  Do not use oversized bedding.  Keep a telephone by your bedside.  Have a firm chair with side arms to use for getting dressed.  Remove throw rugs and tripping hazards from the floor. KITCHEN  Keep handles on pots and  pans turned toward the center of the stove. Use back burners when possible.  Clean up spills quickly and allow time for drying.  Avoid walking on wet floors.  Avoid hot utensils and knives.  Position shelves so they are not too high or low.  Place commonly used objects within easy reach.  If necessary, use a sturdy step stool with a grab bar when reaching.  Keep electrical cables out of the way.  Do not use floor polish or wax that makes floors slippery. If you must use wax, use non-skid floor wax.  Remove throw rugs and tripping hazards from the floor. STAIRWAYS  Never leave objects on stairs.  Place handrails on both sides of stairways and use them. Fix any loose handrails. Make sure handrails on both sides of the stairways are as long as the stairs.  Check carpeting to make sure it is firmly attached along stairs. Make repairs to worn or loose carpet promptly.  Avoid placing throw rugs at the top or bottom of stairways, or properly secure the rug with carpet tape to prevent slippage. Get rid of throw rugs, if possible.  Have an electrician put in a light switch at the top and bottom of the stairs. OTHER FALL PREVENTION TIPS  Wear low-heel or rubber-soled shoes that are supportive and fit well. Wear closed toe shoes.  When using a stepladder, make sure it is  fully opened and both spreaders are firmly locked. Do not climb a closed stepladder.  Add color or contrast paint or tape to grab bars and handrails in your home. Place contrasting color strips on first and last steps.  Learn and use mobility aids as needed. Install an electrical emergency response system.  Turn on lights to avoid dark areas. Replace light bulbs that burn out immediately. Get light switches that glow.  Arrange furniture to create clear pathways. Keep furniture in the same place.  Firmly attach carpet with non-skid or double-sided tape.  Eliminate uneven floor surfaces.  Select a carpet pattern  that does not visually hide the edge of steps.  Be aware of all pets. OTHER HOME SAFETY TIPS  Set the water temperature for 120 F (48.8 C).  Keep emergency numbers on or near the telephone.  Keep smoke detectors on every level of the home and near sleeping areas. Document Released: 11/02/2002 Document Revised: 05/13/2012 Document Reviewed: 02/01/2012 [facility] Patient Information 2015 [place], [place]. This information is not intended to replace advice given to you by your health care provider. Make sure you discuss any questions you have with your health care provider.   Preventive Care for Adults Ages 64 and over  Blood pressure check.** / Every 1 to 2 years.  Lipid and cholesterol check.**/ Every 5 years beginning at age 66.  Lung cancer screening. / Every year if you are aged 64-80 years and have a 30-pack-year history of smoking and currently smoke or have quit within the past 15 years. Yearly screening is stopped once you have quit smoking for at least 15 years or develop a health problem that would prevent you from having lung cancer treatment.  Fecal occult blood test (FOBT) of stool. / Every year beginning at age 64 and continuing until age 79. You may not have to do this test if you get a colonoscopy every 10 years.  Flexible sigmoidoscopy** or colonoscopy.** / Every 5 years for a flexible sigmoidoscopy or every 10 years for a colonoscopy beginning at age 64 and continuing until age 47.  Hepatitis C blood test.** / For all people born from [birth year] through 1964 and any individual with known risks for hepatitis C.  Abdominal aortic aneurysm (AAA) screening.** / A one-time screening for ages 64 to 38 years who are current or former smokers.  Skin self-exam. / Monthly.  Influenza vaccine. / Every year.  Tetanus, diphtheria, and acellular pertussis (Tdap/Td) vaccine.** / 1 dose of Td every 10 years.  Varicella vaccine.** / Consult your health care provider.  Zoster vaccine.**  / 1 dose for adults aged 64 years or older.  Pneumococcal 13-valent conjugate (PCV13) vaccine.** / Consult your health care provider.  Pneumococcal polysaccharide (PPSV23) vaccine.** / 1 dose for all adults aged 64 years and older.  Meningococcal vaccine.** / Consult your health care provider.  Hepatitis A vaccine.** / Consult your health care provider.  Hepatitis B vaccine.** / Consult your health care provider.  Haemophilus influenzae type b (Hib) vaccine.** / Consult your health care provider. **Family history and personal history of risk and conditions may change your health care provider's recommendations. Document Released: 01/08/2002 Document Revised: 11/17/2013 Document Reviewed: 04/09/2011 [facility] Patient Information 2015 [place], [place]. This information is not intended to replace advice given to you by your health care provider. Make sure you discuss any questions you have with your health care provider.

## 2016-09-10 NOTE — Assessment & Plan Note (Addendum)
Td 2008, pnm 23 :2011; prevnar 2017, flu shot today rx zoster provided before  CCS: Cscope 2011, Dr Sharlett Iles, normal , 10 years  Prostate cancer screening: DRE  wnl 2016, PSA was elevated, failed referral to urology multiple times due to a high co-pay . Recheck labs. Labs--BMP, FLP, CBC, A1c, micro PSA.   diet and exercise discussed

## 2016-09-10 NOTE — Progress Notes (Signed)
Subjective:    Patient ID: Edward Stafford, male    DOB: 24-Mar-1951, 65 y.o.   MRN: [id number]  DOS:  09/10/2016 Type of visit - description :   Here for Medicare AWV:  1. Risk factors based on Past M, S, F history: reviewed 2. Physical Activities: active, no routine exercise , no major barriers to exercise , encouraged to start gradually a walking  3. Depression/mood: neg screening  4. Hearing:  No problems noted or reported  5. ADL's: independent, drives  6. Fall Risk: no recent falls, prevention discussed , see AVS 7. home Safety: does feel safe at home  8. Height, weight, & visual acuity: see VS, has seen the eye doctor this year 9. Counseling: provided 10. Labs ordered based on risk factors: if needed  11. Referral Coordination: if needed 12. Care Plan, see assessment and plan , written personalized plan provided , see AVS 13. Cognitive Assessment: motor skills and cognition appropriate for age 28. Care team updated (does not see other MDs regulalrly) 15. End-of-life care discussed last year, has not gotten ready a HC-POA, encouraged to do , info package provided again  In addition, today we discussed the following: HTN: Good med compliance, pulse is low. He doesn't seem to be asymptomatic Hyperlipidemia: Good med compliance.  Elevated PSA: Has not seen urology.  BP Readings from Last 3 Encounters:  09/10/16 130/78  06/06/16 128/76  08/30/15 134/66     Review of Systems Constitutional: No fever. No chills. No unexplained wt changes. No unusual sweats  HEENT: No dental problems, no ear discharge, no facial swelling, no voice changes. No eye discharge, no eye  redness , no  intolerance to light   Respiratory: No wheezing , no  difficulty breathing. No cough , no mucus production  Cardiovascular: No CP, no leg swelling , no  Palpitations  GI: no nausea, no vomiting, no diarrhea , no  abdominal pain.  No blood in the stools. No dysphagia, no odynophagia      Endocrine: No polyphagia, no polyuria , no polydipsia  GU: No dysuria, gross hematuria, difficulty urinating. No urinary urgency, no frequency.  Musculoskeletal: No joint swellings or unusual aches or pains  Skin: No change in the color of the skin, palor , no  Rash  Allergic, immunologic: No environmental allergies , no  food allergies  Neurological: No dizziness no  syncope. No headaches. No diplopia, no slurred, no slurred speech, no motor deficits, no facial  Numbness  Hematological: No enlarged lymph nodes, no easy bruising , no unusual bleedings  Psychiatry: No suicidal ideas, no hallucinations, no beavior problems, no confusion.  No unusual/severe anxiety, no depression   Past Medical History:  Diagnosis Date  . Back pain, chronic    disabled due to chronic back pain  . Chest pain    Cardiac cath (-)2008, Myoview (-) 2011  . Chronic kidney disease   . DIABETES MELLITUS, TYPE II, CONTROLLED 06/08/2008  . Drug abuse-- crack, marihuana 04/19/2007  . GLAUCOMA 12/19/2007  . Hyperlipidemia   . Hypertension   . SINUS BRADYCARDIA 05/17/2010    Past Surgical History:  Procedure Laterality Date  . CHOLECYSTECTOMY      Social History   Social History  . Marital status: Single    Spouse name: N/A  . Number of children: 0  . Years of education: N/A   Occupational History  . disable     Social History Main Topics  . Smoking status: Former Smoker  Years: 5.00    Quit date: 04/17/2011  . Smokeless tobacco: Never Used  . Alcohol use 0.6 oz/week    1 Cans of beer per week     Comment: occasionally  . Drug use:     Types: "Crack" cocaine, Marijuana     Comment: denies since ~ 2014  . Sexual activity: Not on file   Other Topics Concern  . Not on file   Social History Narrative   Lives by himself, has a car        Medication List       Accurate as of 09/10/16  3:38 PM. Always use your most recent med list.          amLODipine 10 MG tablet Commonly known  as:  NORVASC Take 1 tablet (10 mg total) by mouth daily.   carvedilol 12.5 MG tablet Commonly known as:  COREG Take 1 tablet (12.5 mg total) by mouth 2 (two) times daily with a meal.   pravastatin 20 MG tablet Commonly known as:  PRAVACHOL Take 1 tablet (20 mg total) by mouth daily.          Objective:   Physical Exam BP 130/78 (BP Location: Left Arm, Patient Position: Sitting, Cuff Size: Normal)   Pulse (!) 43   Temp 97.7 F (36.5 C) (Oral)   Resp 14   Ht 6' (1.829 m)   Wt 281 lb 4 oz (127.6 kg)   SpO2 97%   BMI 38.14 kg/m   General:   Well developed, well nourished . NAD.  Neck: No  thyromegaly  HEENT:  Normocephalic . Face symmetric, atraumatic Lungs:  CTA B Normal respiratory effort, no intercostal retractions, no accessory muscle use. Heart: RRR,  no murmur.  No pretibial edema bilaterally  Abdomen:  Not distended, soft, non-tender. No rebound or rigidity.   Skin: Exposed areas without rash. Not pale. Not jaundice Neurologic:  alert & oriented X3.  Speech normal, gait appropriate for age and unassisted Strength symmetric and appropriate for age.  Psych: Cognition and judgment appear intact.  Cooperative with normal attention span and concentration.  Behavior appropriate. No anxious or depressed appearing.    Assessment & Plan:   Assessment > Prediabetes HTN Hyperlipidemia CKD Glaucoma Elevated PSA : 8.6  2, did not show for urology referral 3 as of 05-2016 Chronic back pain, disabled   H/o  Drug abuse, crack, marijuana  H/o CP Cath 2008 and Myoview 2011 negative  PLAN: Prediabetes: Check an A1c and micro. HTN: Seems controlled with amlodipine and carvedilol but pulse is 43 today. Stop carvedilol, start on low dose losartan 25 mg, nurse visit in 2 weeks: BP check and labs. CKD: changing BP meds. Checking labs in 2 weeks Hyperlipidemia:   Labs  Nurse visit 2 weeks: BMP, FLP, CBC, AST ALT, A1c, micro , PSA RTC 2 months

## 2016-09-10 NOTE — Progress Notes (Signed)
Pre visit review using our clinic review tool, if applicable. No additional management support is needed unless otherwise documented below in the visit note. 

## 2016-09-11 NOTE — Assessment & Plan Note (Signed)
Prediabetes: Check an A1c and micro. HTN: Seems controlled with amlodipine and carvedilol but pulse is 43 today. Stop carvedilol, start on low dose losartan 25 mg, nurse visit in 2 weeks: BP check and labs. CKD: changing BP meds. Checking labs in 2 weeks Hyperlipidemia:   Labs  Nurse visit 2 weeks: BMP, FLP, CBC, AST ALT, A1c, micro , PSA RTC 2 months

## 2016-10-02 ENCOUNTER — Ambulatory Visit (INDEPENDENT_AMBULATORY_CARE_PROVIDER_SITE_OTHER): Payer: Commercial Managed Care - HMO | Admitting: Internal Medicine

## 2016-10-02 VITALS — BP 114/67 | HR 45

## 2016-10-02 DIAGNOSIS — I10 Essential (primary) hypertension: Secondary | ICD-10-CM

## 2016-10-02 DIAGNOSIS — E785 Hyperlipidemia, unspecified: Secondary | ICD-10-CM

## 2016-10-02 DIAGNOSIS — E782 Mixed hyperlipidemia: Secondary | ICD-10-CM | POA: Diagnosis not present

## 2016-10-02 DIAGNOSIS — E118 Type 2 diabetes mellitus with unspecified complications: Secondary | ICD-10-CM | POA: Diagnosis not present

## 2016-10-02 DIAGNOSIS — R972 Elevated prostate specific antigen [PSA]: Secondary | ICD-10-CM | POA: Diagnosis not present

## 2016-10-02 LAB — CBC WITH DIFFERENTIAL/PLATELET
Basophils Absolute: 0 10*3/uL (ref 0.0–0.1)
Basophils Relative: 0.6 % (ref 0.0–3.0)
Eosinophils Absolute: 0.1 10*3/uL (ref 0.0–0.7)
Eosinophils Relative: 3.5 % (ref 0.0–5.0)
HCT: 44.3 % (ref 39.0–52.0)
Hemoglobin: 14.9 g/dL (ref 13.0–17.0)
Lymphocytes Relative: 39.6 % (ref 12.0–46.0)
Lymphs Abs: 1.6 10*3/uL (ref 0.7–4.0)
MCHC: 33.5 g/dL (ref 30.0–36.0)
MCV: 85.3 fl (ref 78.0–100.0)
Monocytes Absolute: 0.5 10*3/uL (ref 0.1–1.0)
Monocytes Relative: 11.6 % (ref 3.0–12.0)
Neutro Abs: 1.8 10*3/uL (ref 1.4–7.7)
Neutrophils Relative %: 44.7 % (ref 43.0–77.0)
Platelets: 276 10*3/uL (ref 150.0–400.0)
RBC: 5.19 Mil/uL (ref 4.22–5.81)
RDW: 14.8 % (ref 11.5–15.5)
WBC: 4.1 10*3/uL (ref 4.0–10.5)

## 2016-10-02 LAB — BASIC METABOLIC PANEL
BUN: 12 mg/dL (ref 6–23)
CO2: 27 mEq/L (ref 19–32)
Calcium: 9.5 mg/dL (ref 8.4–10.5)
Chloride: 104 mEq/L (ref 96–112)
Creatinine, Ser: 1.42 mg/dL (ref 0.40–1.50)
GFR: 64.4 mL/min (ref >60.00)
Glucose, Bld: 93 mg/dL (ref 70–99)
Potassium: 4.2 mEq/L (ref 3.5–5.1)
Sodium: 136 mEq/L (ref 135–145)

## 2016-10-02 LAB — LIPID PANEL
Cholesterol: 138 mg/dL (ref 0–200)
HDL: 37.9 mg/dL — ABNORMAL LOW (ref >39.00)
LDL Cholesterol: 68 mg/dL (ref 0–99)
NonHDL: 99.6
Total CHOL/HDL Ratio: 4
Triglycerides: 158 mg/dL — ABNORMAL HIGH (ref 0.0–149.0)
VLDL: 31.6 mg/dL (ref 0.0–40.0)

## 2016-10-02 LAB — MICROALBUMIN / CREATININE URINE RATIO
Creatinine,U: 546.2 mg/dL
Microalb Creat Ratio: 1 mg/g (ref 0.0–30.0)
Microalb, Ur: 5.6 mg/dL — ABNORMAL HIGH (ref 0.0–1.9)

## 2016-10-02 LAB — ALT: ALT: 21 U/L (ref 0–53)

## 2016-10-02 LAB — AST: AST: 16 U/L (ref 0–37)

## 2016-10-02 LAB — PSA: PSA: 11.16 ng/mL — ABNORMAL HIGH (ref 0.10–4.00)

## 2016-10-02 LAB — HEMOGLOBIN A1C: Hgb A1c MFr Bld: 6.1 % (ref 4.6–6.5)

## 2016-10-02 NOTE — Progress Notes (Addendum)
Pre visit review using our clinic tool,if applicable. No additional management support is needed unless otherwise documented below in the visit note.   Patient in for BP check per [name].  BP=114/67 Pulse=45  Patient to have labs drawn in lab and return for follow up in 2 months. Patient notified.  Continue taking medications as ordered.  Kathlene November, MD

## 2016-10-07 ENCOUNTER — Other Ambulatory Visit: Payer: Self-pay | Admitting: Internal Medicine

## 2016-10-12 ENCOUNTER — Other Ambulatory Visit: Payer: Self-pay | Admitting: Internal Medicine

## 2016-10-30 ENCOUNTER — Ambulatory Visit (HOSPITAL_BASED_OUTPATIENT_CLINIC_OR_DEPARTMENT_OTHER)
Admission: RE | Admit: 2016-10-30 | Discharge: 2016-10-30 | Disposition: A | Discharge status: Home / Self Care | Payer: Commercial Managed Care - HMO | Source: Ambulatory Visit | Attending: Internal Medicine | Admitting: Internal Medicine

## 2016-10-30 ENCOUNTER — Encounter: Payer: Self-pay | Admitting: Internal Medicine

## 2016-10-30 ENCOUNTER — Ambulatory Visit (INDEPENDENT_AMBULATORY_CARE_PROVIDER_SITE_OTHER): Payer: Commercial Managed Care - HMO | Admitting: Internal Medicine

## 2016-10-30 VITALS — BP 126/78 | HR 48 | Temp 97.6°F | Resp 14 | Ht 72.0 in | Wt 273.0 lb

## 2016-10-30 DIAGNOSIS — M25551 Pain in right hip: Secondary | ICD-10-CM | POA: Diagnosis not present

## 2016-10-30 MED ORDER — PREDNISONE 10 MG PO TABS: ORAL_TABLET | ORAL | 0 refills | Status: DC

## 2016-10-30 NOTE — Assessment & Plan Note (Signed)
Hip pain: Presents with buttock  pain, no radiation, probably related to a hip DJD flare up. Avoid NSAIDs due to CKD. Recommend x-ray, low dose of prednisone for a few days and Tylenol. See instructions. Elevated PSA: Has not seen urology, I emphasized the need to proceed with further eval, prostate cancer is a real possibility, patient aware.

## 2016-10-30 NOTE — Patient Instructions (Signed)
Get your x-ray  Start taking prednisone as prescribed  Tylenol  500 mg OTC 2 tabs a day every 8 hours as needed for pain  Avoid Motrin, naproxen, ibuprofen, Advil or similar medications  Call me if you are not gradually improving in the next 2 weeks

## 2016-10-30 NOTE — Progress Notes (Signed)
Pre visit review using our clinic review tool, if applicable. No additional management support is needed unless otherwise documented below in the visit note. 

## 2016-10-30 NOTE — Progress Notes (Signed)
Subjective:    Patient ID: Edward Stafford, male    DOB: 1951-09-07, 65 y.o.   MRN: [id number]  DOS:  10/30/2016 Type of visit - description : Acute visit Interval history: One-week history of pain located at the right buttock, constant, no radiation. Denies any injury. Has take a OTC (name?) with minimal help.   Review of Systems Denies fever chills No other unusual aches and pains. No bladder or bowel incontinence No lower extremity paresthesias  Past Medical History:  Diagnosis Date  . Back pain, chronic    disabled due to chronic back pain  . Chest pain    Cardiac cath (-)2008, Myoview (-) 2011  . Chronic kidney disease   . DIABETES MELLITUS, TYPE II, CONTROLLED 06/08/2008  . Drug abuse-- crack, marijuana 04/19/2007   hx of  . GLAUCOMA 12/19/2007  . Hyperlipidemia   . Hypertension   . SINUS BRADYCARDIA 05/17/2010    Past Surgical History:  Procedure Laterality Date  . CHOLECYSTECTOMY      Social History   Social History  . Marital status: Single    Spouse name: N/A  . Number of children: 0  . Years of education: N/A   Occupational History  . disable     Social History Main Topics  . Smoking status: Former Smoker    Years: 5.00    Quit date: 04/17/2011  . Smokeless tobacco: Never Used     Comment: never heavy smoker   . Alcohol use 0.6 oz/week    1 Cans of beer per week     Comment: occasionally  . Drug use: No     Comment: denies since ~ 2014, h/o marijuana-crack  . Sexual activity: Not on file   Other Topics Concern  . Not on file   Social History Narrative   Lives by himself, has a car        Medication List       Accurate as of 10/30/16  9:14 PM. Always use your most recent med list.          amLODipine 10 MG tablet Commonly known as:  NORVASC Take 1 tablet (10 mg total) by mouth daily.   losartan 25 MG tablet Commonly known as:  COZAAR Take 1 tablet (25 mg total) by mouth daily.   pravastatin 20 MG tablet Commonly known  as:  PRAVACHOL Take 1 tablet (20 mg total) by mouth daily.   predniSONE 10 MG tablet Commonly known as:  DELTASONE 3 tabs x 3 days, 2 tabs x 3 days, 1 tab x 3 days          Objective:   Physical Exam BP 126/78 (BP Location: Left Arm, Patient Position: Sitting, Cuff Size: Normal)   Pulse (!) 48   Temp 97.6 F (36.4 C) (Oral)   Resp 14   Ht 6' (1.829 m)   Wt 273 lb (123.8 kg)   SpO2 96%   BMI 37.03 kg/m  General:   Well developed, well nourished . NAD.  HEENT:  Normocephalic . Face symmetric, atraumatic MSK: No TTP at the thoracic and lumbar spine. Slightly TTP at the buttock but not at the SI joint or trochanteric bursa. Hip rotation normal. Skin: Not pale. Not jaundice Neurologic:  alert & oriented X3.  Speech normal, gait at baseline, unassisted. DTRs symmetric Psych--  Cognition and judgment appear intact.  Cooperative with normal attention span and concentration.  Behavior appropriate. No anxious or depressed appearing.      Assessment &  Plan:   Assessment   Prediabetes HTN Hyperlipidemia CKD Glaucoma Elevated PSA : 8.6  2, did not show for urology referral 3 as of 05-2016 Chronic back pain, disabled   H/o  Drug abuse, crack, marijuana  H/o CP Cath 2008 and Myoview 2011 negative  PLAN: Hip pain: Presents with buttock  pain, no radiation, probably related to a hip DJD flare up. Avoid NSAIDs due to CKD. Recommend x-ray, low dose of prednisone for a few days and Tylenol. See instructions. Elevated PSA: Has not seen urology, I emphasized the need to proceed with further eval, prostate cancer is a real possibility, patient aware.

## 2016-12-07 ENCOUNTER — Ambulatory Visit (INDEPENDENT_AMBULATORY_CARE_PROVIDER_SITE_OTHER): Payer: Medicare HMO | Admitting: Internal Medicine

## 2016-12-07 ENCOUNTER — Encounter: Payer: Self-pay | Admitting: Internal Medicine

## 2016-12-07 VITALS — BP 128/78 | HR 46 | Temp 98.1°F | Resp 14 | Ht 72.0 in | Wt 272.2 lb

## 2016-12-07 DIAGNOSIS — I495 Sick sinus syndrome: Secondary | ICD-10-CM

## 2016-12-07 DIAGNOSIS — I10 Essential (primary) hypertension: Secondary | ICD-10-CM | POA: Diagnosis not present

## 2016-12-07 NOTE — Patient Instructions (Signed)
GO TO THE LAB : Get the blood work     GO TO THE FRONT DESK Schedule your next appointment for a  complete physical exam by October 2018  Continue  taking the same medications Check the  blood pressure 2 or 3 times a month   Be sure your blood pressure is between 110/65 and  145/85. If it is consistently higher or lower, let me know   See your urologist as soon as you can  Call if you have fatigue, dizziness

## 2016-12-07 NOTE — Progress Notes (Signed)
Pre visit review using our clinic review tool, if applicable. No additional management support is needed unless otherwise documented below in the visit note. 

## 2016-12-07 NOTE — Progress Notes (Signed)
Subjective:    Patient ID: Edward Stafford, male    DOB: [birth year], 66 y.o.   MRN: [id number]  DOS:  12/07/2016 Type of visit - description : Routine checkup Interval history:  HTN: Good compliance of medication, no ambulatory BPs Heart rate continue to be low, he is asymptomatic at rest and with exertion Hip pain: Better after he changed his mattress.  Review of Systems  Denies chest pain, difficulty breathing No nausea, vomiting, diarrhea  Past Medical History:  Diagnosis Date  . Back pain, chronic    disabled due to chronic back pain  . Chest pain    Cardiac cath (-)2008, Myoview (-) 2011  . Chronic kidney disease   . DIABETES MELLITUS, TYPE II, CONTROLLED 06/08/2008  . Drug abuse-- crack, marijuana 04/19/2007   hx of  . GLAUCOMA 12/19/2007  . Hyperlipidemia   . Hypertension   . SINUS BRADYCARDIA 05/17/2010    Past Surgical History:  Procedure Laterality Date  . CHOLECYSTECTOMY      Social History   Social History  . Marital status: Single    Spouse name: N/A  . Number of children: 0  . Years of education: N/A   Occupational History  . disable     Social History Main Topics  . Smoking status: Former Smoker    Years: 5.00    Quit date: 04/17/2011  . Smokeless tobacco: Never Used     Comment: never heavy smoker   . Alcohol use 0.6 oz/week    1 Cans of beer per week     Comment: occasionally  . Drug use: No     Comment: denies since ~ 2014, h/o marijuana-crack  . Sexual activity: Not on file   Other Topics Concern  . Not on file   Social History Narrative   Lives by himself, has a car      Allergies as of 12/07/2016   No Known Allergies     Medication List       Accurate as of 12/07/16 11:59 PM. Always use your most recent med list.          amLODipine 10 MG tablet Commonly known as:  NORVASC Take 1 tablet (10 mg total) by mouth daily.   losartan 25 MG tablet Commonly known as:  COZAAR Take 1 tablet (25 mg total) by mouth daily.     pravastatin 20 MG tablet Commonly known as:  PRAVACHOL Take 1 tablet (20 mg total) by mouth daily.          Objective:   Physical Exam BP 128/78 (BP Location: Left Arm, Patient Position: Sitting, Cuff Size: Normal)   Pulse (!) 46   Temp 98.1 F (36.7 C) (Oral)   Resp 14   Ht 6' (1.829 m)   Wt 272 lb 4 oz (123.5 kg)   SpO2 97%   BMI 36.92 kg/m  General:   Well developed, well nourished . NAD.  HEENT:  Normocephalic . Face symmetric, atraumatic Lungs:  CTA B Normal respiratory effort, no intercostal retractions, no accessory muscle use. Heart: bradycardic,  no murmur.  No pretibial edema bilaterally  Skin: Not pale. Not jaundice Neurologic:  alert & oriented X3.  Speech normal, gait appropriate for age and unassisted Psych--  Cognition and judgment appear intact.  Cooperative with normal attention span and concentration.  Behavior appropriate. No anxious or depressed appearing.      Assessment & Plan:    Assessment   Prediabetes HTN Hyperlipidemia Bradycardia, chronic (not on  BB-CCBs, asx  ekg 11-2016: sinus brady)  CKD Glaucoma Elevated PSA : 8.6  2, did not show for urology referral 3 as of 05-2016 Chronic back pain, disabled   H/o  Drug abuse, crack, marijuana  H/o CP Cath 2008 and Myoview 2011 negative  PLAN: HTN: Controlled, continue Losartan and amlodipine Bradycardic: Asx, EKG confirms sinus bradycardia. Check a TSH otherwise observation Hip pain: Better, patient thinks related to changing his mattress Elevated PSA: Has not pursued urology eval just yet. RTC CPX 08-2017

## 2016-12-09 NOTE — Assessment & Plan Note (Signed)
HTN: Controlled, continue Losartan and amlodipine Bradycardic: Asx, EKG confirms sinus bradycardia. Check a TSH otherwise observation Hip pain: Better, patient thinks related to changing his mattress Elevated PSA: Has not pursued urology eval just yet. RTC CPX 08-2017

## 2016-12-17 ENCOUNTER — Ambulatory Visit: Payer: Commercial Managed Care - HMO | Admitting: Internal Medicine

## 2016-12-20 ENCOUNTER — Telehealth: Payer: Self-pay | Admitting: Internal Medicine

## 2016-12-20 MED ORDER — AMLODIPINE BESYLATE 10 MG PO TABS: 10.0000 mg | ORAL_TABLET | Freq: Every day | ORAL | 2 refills | Status: DC

## 2016-12-20 MED ORDER — LOSARTAN POTASSIUM 25 MG PO TABS: 25.0000 mg | ORAL_TABLET | Freq: Every day | ORAL | 2 refills | Status: DC

## 2016-12-20 MED ORDER — PRAVASTATIN SODIUM 20 MG PO TABS: 20.0000 mg | ORAL_TABLET | Freq: Every day | ORAL | 2 refills | Status: DC

## 2016-12-20 NOTE — Telephone Encounter (Signed)
Rxs sent

## 2016-12-20 NOTE — Telephone Encounter (Signed)
Relation to [id number] Call back [phone] Pharmacy: [id number] - [place], [place]. [phone] (Phone) [phone] (Fax)     Reason for call:  Patient requesting a refill losartan (COZAAR) 25 MG tablet, pravastatin (PRAVACHOL) 20 MG tablet and amLODipine (NORVASC) 10 MG tablet

## 2017-02-21 ENCOUNTER — Telehealth: Payer: Self-pay | Admitting: Internal Medicine

## 2017-02-21 NOTE — Telephone Encounter (Signed)
Spoke w/ Pt, informed him that carvedilol was d/c at [facility] on 09/10/2016 due to low heart rate. Pt verbalized understanding.

## 2017-02-21 NOTE — Telephone Encounter (Signed)
Carvedilol no longer on Pt med list; chart reviewed. Carvedilol d/c on 09/10/2016. See below. Will contact Pt.   PLAN: Prediabetes: Check an A1c and micro. HTN: Seems controlled with amlodipine and carvedilol but pulse is 43 today. Stop carvedilol, start on low dose losartan 25 mg, nurse visit in 2 weeks: BP check and labs. CKD: changing BP meds. Checking labs in 2 weeks Hyperlipidemia:   Labs  Nurse visit 2 weeks: BMP, FLP, CBC, AST ALT, A1c, micro , PSA RTC 2 months

## 2017-02-21 NOTE — Telephone Encounter (Signed)
Relation to [id number]  Call back number:[phone] Pharmacy: [id number] - [place], [place]. [phone] (Phone) [phone] (Fax)     Reason for call:  Patient requesting a refill carvedilol (COREG) 12.5 MG tablet

## 2017-05-28 ENCOUNTER — Telehealth: Payer: Self-pay | Admitting: Internal Medicine

## 2017-05-28 NOTE — Telephone Encounter (Signed)
Patient Name: Edward Stafford DOB: [birth year] Initial Comment Caller states his legs are swelled to the knee cap. Tightness. This has been going on for a week. Nurse Assessment Nurse: Ronnald Ramp, RN, Miranda Date/Time (Eastern Time): 05/28/2017 10:31:49 AM Confirm and document reason for call. If symptomatic, describe symptoms. ---Caller states he has had swelling in his lower legs from knees to ankles for 1 week. Does the patient have any new or worsening symptoms? ---Yes Will a triage be completed? ---Yes Related visit to physician within the last 2 weeks? ---No Does the PT have any chronic conditions? (i.e. diabetes, asthma, etc.) ---Yes List chronic conditions. ---HTN, Is this a behavioral health or substance abuse call? ---No Guidelines Guideline Title Affirmed Question Affirmed Notes Leg Swelling and Edema [1] MODERATE leg swelling (e.g., swelling extends up to knees) AND [2] new onset or worsening Final Disposition User See Physician within 24 Hours Jones, RN, Miranda Comments No appt available within recommended time frame with PCP or at primary office. Pt declined being seen at another location. Appt scheduled for Thursday at 49 with [name] Referrals REFERRED TO PCP OFFICE Disagree/Comply: Comply

## 2017-05-30 ENCOUNTER — Ambulatory Visit (INDEPENDENT_AMBULATORY_CARE_PROVIDER_SITE_OTHER): Payer: Medicare HMO | Admitting: Internal Medicine

## 2017-05-30 ENCOUNTER — Encounter: Payer: Self-pay | Admitting: Internal Medicine

## 2017-05-30 VITALS — BP 138/78 | HR 58 | Temp 98.2°F | Resp 14 | Ht 72.0 in | Wt 289.2 lb

## 2017-05-30 DIAGNOSIS — I10 Essential (primary) hypertension: Secondary | ICD-10-CM | POA: Diagnosis not present

## 2017-05-30 DIAGNOSIS — M7989 Other specified soft tissue disorders: Secondary | ICD-10-CM

## 2017-05-30 MED ORDER — HYDROCHLOROTHIAZIDE 25 MG PO TABS: 25.0000 mg | ORAL_TABLET | Freq: Every day | ORAL | 1 refills | Status: DC

## 2017-05-30 NOTE — Assessment & Plan Note (Signed)
LE edema: No new medications, no cardiopulmonary sxs, no increased salt intake. EKG today: Sinus bradycardia, at baseline.  Edema due to amlodipine? Plan: Stop amlodipine, start HCTZ, continue losartan. CMP and TSH in 2 weeks

## 2017-05-30 NOTE — Patient Instructions (Addendum)
   GO TO THE FRONT DESK Schedule labs to be done 2 weeks from today. No need to be fasting.  Keep your appointment to see me in October  For  swelling: Stop amlodipine Start hydrochlorothiazide one tablet in morning  Check the  blood pressure 2 or 3 times a  week   Be sure your blood pressure is between 110/65 and  145/85. If it is consistently higher or lower, let me know

## 2017-05-30 NOTE — Progress Notes (Signed)
Subjective:    Patient ID: Edward Stafford, male    DOB: 10/24/1951, 66 y.o.   MRN: [id number]  DOS:  05/30/2017 Type of visit - description : acute Interval history: Reports one-week history of bilateral swelling, up to the knee. Swelling not worse or better at the end of the day. Not taking any new medicines. No increase in salt intake. No recent prolonged car trip or airplane trip.  Wt Readings from Last 3 Encounters:  05/30/17 289 lb 4 oz (131.2 kg)  12/07/16 272 lb 4 oz (123.5 kg)  10/30/16 273 lb (123.8 kg)     Review of Systems Denies chest pain, difficulty breathing. No palpitation. No DOE or orthopnea. No cough.   Past Medical History:  Diagnosis Date  . Back pain, chronic    disabled due to chronic back pain  . Chest pain    Cardiac cath (-)2008, Myoview (-) 2011  . Chronic kidney disease   . DIABETES MELLITUS, TYPE II, CONTROLLED 06/08/2008  . Drug abuse-- crack, marijuana 04/19/2007   hx of  . GLAUCOMA 12/19/2007  . Hyperlipidemia   . Hypertension   . SINUS BRADYCARDIA 05/17/2010    Past Surgical History:  Procedure Laterality Date  . CHOLECYSTECTOMY      Social History   Social History  . Marital status: Single    Spouse name: N/A  . Number of children: 0  . Years of education: N/A   Occupational History  . disable     Social History Main Topics  . Smoking status: Former Smoker    Years: 5.00    Quit date: 04/17/2011  . Smokeless tobacco: Never Used     Comment: never heavy smoker   . Alcohol use 0.6 oz/week    1 Cans of beer per week     Comment: occasionally  . Drug use: No     Comment: denies since ~ 2014, h/o marijuana-crack  . Sexual activity: Not on file   Other Topics Concern  . Not on file   Social History Narrative   Lives by himself, has a car      Allergies as of 05/30/2017   No Known Allergies     Medication List       Accurate as of 05/30/17  1:48 PM. Always use your most recent med list.            hydrochlorothiazide 25 MG tablet Commonly known as:  HYDRODIURIL Take 1 tablet (25 mg total) by mouth daily.   losartan 25 MG tablet Commonly known as:  COZAAR Take 1 tablet (25 mg total) by mouth daily.   pravastatin 20 MG tablet Commonly known as:  PRAVACHOL Take 1 tablet (20 mg total) by mouth daily.          Objective:   Physical Exam BP 138/78 (BP Location: Left Arm, Patient Position: Sitting, Cuff Size: Normal)   Pulse (!) 58   Temp 98.2 F (36.8 C) (Oral)   Resp 14   Ht 6' (1.829 m)   Wt 289 lb 4 oz (131.2 kg)   SpO2 96%   BMI 39.23 kg/m  General:   Well developed, well nourished . NAD.  HEENT:  Normocephalic . Face symmetric, atraumatic Neck: No JVD at 45 Lungs:  CTA B Normal respiratory effort, no intercostal retractions, no accessory muscle use. Heart: RRR,  no murmur.  +/+++ pretibial edema bilaterally up to the knee Skin: Not pale. Not jaundice Neurologic:  alert & oriented X3.  Speech  normal, gait appropriate for age and unassisted Psych--  Cognition and judgment appear intact.  Cooperative with normal attention span and concentration.  Behavior appropriate. No anxious or depressed appearing.      Assessment & Plan:   Assessment   Prediabetes HTN Hyperlipidemia Bradycardia, chronic (not on  BB-CCBs, asx  ekg 11-2016: sinus brady)  CKD Glaucoma Elevated PSA : 8.6  2, did not show for urology referral 3 as of 05-2016 Chronic back pain, disabled   H/o  Drug abuse, crack, marijuana  H/o CP Cath 2008 and Myoview 2011 negative  PLAN: LE edema: No new medications, no cardiopulmonary sxs, no increased salt intake. EKG today: Sinus bradycardia, at baseline.  Edema due to amlodipine? Plan: Stop amlodipine, start HCTZ, continue losartan. CMP and TSH in 2 weeks

## 2017-05-30 NOTE — Progress Notes (Signed)
Pre visit review using our clinic review tool, if applicable. No additional management support is needed unless otherwise documented below in the visit note. 

## 2017-06-13 ENCOUNTER — Other Ambulatory Visit (INDEPENDENT_AMBULATORY_CARE_PROVIDER_SITE_OTHER): Payer: Medicare HMO

## 2017-06-13 DIAGNOSIS — I10 Essential (primary) hypertension: Secondary | ICD-10-CM | POA: Diagnosis not present

## 2017-06-13 LAB — COMPREHENSIVE METABOLIC PANEL
ALT: 16 U/L (ref 0–53)
AST: 18 U/L (ref 0–37)
Albumin: 4.3 g/dL (ref 3.5–5.2)
Alkaline Phosphatase: 96 U/L (ref 39–117)
BUN: 11 mg/dL (ref 6–23)
CO2: 29 mEq/L (ref 19–32)
Calcium: 9.8 mg/dL (ref 8.4–10.5)
Chloride: 97 mEq/L (ref 96–112)
Creatinine, Ser: 1.52 mg/dL — ABNORMAL HIGH (ref 0.40–1.50)
GFR: 59.4 mL/min — ABNORMAL LOW (ref >60.00)
Glucose, Bld: 84 mg/dL (ref 70–99)
Potassium: 3.5 mEq/L (ref 3.5–5.1)
Sodium: 136 mEq/L (ref 135–145)
Total Bilirubin: 0.7 mg/dL (ref 0.2–1.2)
Total Protein: 7.6 g/dL (ref 6.0–8.3)

## 2017-06-13 LAB — TSH: TSH: 1.36 u[IU]/mL (ref 0.35–4.50)

## 2017-07-24 ENCOUNTER — Other Ambulatory Visit: Payer: Self-pay | Admitting: Internal Medicine

## 2017-08-23 ENCOUNTER — Other Ambulatory Visit: Payer: Self-pay

## 2017-08-23 MED ORDER — PRAVASTATIN SODIUM 20 MG PO TABS: 20.0000 mg | ORAL_TABLET | Freq: Every day | ORAL | 0 refills | Status: DC

## 2017-08-23 MED ORDER — HYDROCHLOROTHIAZIDE 25 MG PO TABS: 25.0000 mg | ORAL_TABLET | Freq: Every day | ORAL | 0 refills | Status: DC

## 2017-08-23 MED ORDER — LOSARTAN POTASSIUM 25 MG PO TABS: 25.0000 mg | ORAL_TABLET | Freq: Every day | ORAL | 0 refills | Status: DC

## 2017-09-12 ENCOUNTER — Encounter: Payer: Medicare HMO | Admitting: Internal Medicine

## 2017-09-12 DIAGNOSIS — Z0289 Encounter for other administrative examinations: Secondary | ICD-10-CM

## 2017-11-05 ENCOUNTER — Encounter: Payer: Medicare HMO | Admitting: Internal Medicine

## 2017-11-11 ENCOUNTER — Ambulatory Visit (INDEPENDENT_AMBULATORY_CARE_PROVIDER_SITE_OTHER): Payer: Self-pay | Admitting: Internal Medicine

## 2017-11-11 ENCOUNTER — Encounter: Payer: Self-pay | Admitting: Internal Medicine

## 2017-11-11 VITALS — BP 126/84 | HR 62 | Temp 98.3°F | Resp 14 | Ht 72.0 in | Wt 288.5 lb

## 2017-11-11 DIAGNOSIS — E782 Mixed hyperlipidemia: Secondary | ICD-10-CM

## 2017-11-11 DIAGNOSIS — I10 Essential (primary) hypertension: Secondary | ICD-10-CM

## 2017-11-11 DIAGNOSIS — E118 Type 2 diabetes mellitus with unspecified complications: Secondary | ICD-10-CM

## 2017-11-11 DIAGNOSIS — Z0000 Encounter for general adult medical examination without abnormal findings: Secondary | ICD-10-CM

## 2017-11-11 DIAGNOSIS — R972 Elevated prostate specific antigen [PSA]: Secondary | ICD-10-CM

## 2017-11-11 DIAGNOSIS — Z23 Encounter for immunization: Secondary | ICD-10-CM

## 2017-11-11 NOTE — Progress Notes (Signed)
Subjective:    Patient ID: Edward Stafford, male    DOB: [birth year], 66 y.o.   MRN: [id number]  DOS:  11/11/2017 Type of visit - description : rov Interval history: HTN: Good compliance with medication, no edema, no ambulatory BPs Increased PSA: Has not seen urology yet. High cholesterol: On Pravachol, due for labs   Review of Systems  Denies chest pain or difficulty breathing No dysuria, gross hematuria difficulty urinating  Past Medical History:  Diagnosis Date  . Back pain, chronic    disabled due to chronic back pain  . Chest pain    Cardiac cath (-)2008, Myoview (-) 2011  . Chronic kidney disease   . DIABETES MELLITUS, TYPE II, CONTROLLED 06/08/2008  . Drug abuse-- crack, marijuana 04/19/2007   hx of  . GLAUCOMA 12/19/2007  . Hyperlipidemia   . Hypertension   . SINUS BRADYCARDIA 05/17/2010    Past Surgical History:  Procedure Laterality Date  . CHOLECYSTECTOMY      Social History   Socioeconomic History  . Marital status: Single    Spouse name: Not on file  . Number of children: 0  . Years of education: Not on file  . Highest education level: Not on file  Social Needs  . Financial resource strain: Not on file  . Food insecurity - worry: Not on file  . Food insecurity - inability: Not on file  . Transportation needs - medical: Not on file  . Transportation needs - non-medical: Not on file  Occupational History  . Occupation: disable   Tobacco Use  . Smoking status: Former Smoker    Years: 5.00    Last attempt to quit: 04/17/2011    Years since quitting: 6.5  . Smokeless tobacco: Never Used  . Tobacco comment: never heavy smoker   Substance and Sexual Activity  . Alcohol use: No    Alcohol/week: 0.6 oz    Types: 1 Cans of beer per week    Frequency: Never    Comment: nobe  . Drug use: No    Comment: denies since ~ 2014, h/o marijuana-crack  . Sexual activity: Not on file  Other Topics Concern  . Not on file  Social History Narrative   Lives  by himself, has a car      Allergies as of 11/11/2017   No Known Allergies     Medication List        Accurate as of 11/11/17 11:59 PM. Always use your most recent med list.          hydrochlorothiazide 25 MG tablet Commonly known as:  HYDRODIURIL Take 1 tablet (25 mg total) by mouth daily.   losartan 25 MG tablet Commonly known as:  COZAAR Take 1 tablet (25 mg total) by mouth daily.   pravastatin 20 MG tablet Commonly known as:  PRAVACHOL Take 1 tablet (20 mg total) by mouth daily.          Objective:   Physical Exam BP 126/84 (BP Location: Right Arm, Patient Position: Sitting, Cuff Size: Normal)   Pulse 62   Temp 98.3 F (36.8 C) (Oral)   Resp 14   Ht 6' (1.829 m)   Wt 288 lb 8 oz (130.9 kg)   SpO2 96%   BMI 39.13 kg/m   General:   Well developed, well nourished . NAD.  HEENT:  Normocephalic . Face symmetric, atraumatic Lungs:  CTA B Normal respiratory effort, no intercostal retractions, no accessory muscle use. Heart: RRR,  no  murmur.  no pretibial edema bilaterally  Rectal:  External abnormalities: none. Normal sphincter tone. No rectal masses or tenderness.  Stool brown  Prostate: Prostate gland firm and smooth, no enlargement, nodularity, tenderness, mass, asymmetry or induration.  Skin: Not pale. Not jaundice Neurologic:  alert & oriented X3.  Speech normal, gait appropriate for age and unassisted Psych--  Cognition and judgment appear intact.  Cooperative with normal attention span and concentration.  Behavior appropriate. No anxious or depressed appearing.     Assessment & Plan:   Assessment   Prediabetes HTN Hyperlipidemia Bradycardia, chronic (not on  BB-CCBs, asx  ekg 11-2016: sinus brady)  CKD Glaucoma Elevated PSA : 8.6  2, did not show for urology referral 3 as of 05-2016 Chronic back pain, disabled   H/o  Drug abuse, crack, marijuana  H/o CP Cath 2008 and Myoview 2011 negative  PLAN: Prediabetes: Counseled, diet  exercise, check a A1c HTN: Currently on losartan, HCTZ.  Check a BMP-CBC High cholesterol: On Pravachol, recent LFTs normal, check a lipid panel, not fasting. Elevated PSA: DRE today is normal, PSA increasing, advised patient he could have prostate cancer, if he delays  further evaluation cancer n=may become  noncurable.  States he will call urology, concerned about cost of care . Preventive care: TD and flu shot today RTC 6 months.

## 2017-11-11 NOTE — Patient Instructions (Signed)
GO TO THE LAB : Get the blood work     GO TO THE FRONT DESK Schedule your next appointment for a  routine checkup in 6 months  

## 2017-11-11 NOTE — Progress Notes (Signed)
Pre visit review using our clinic review tool, if applicable. No additional management support is needed unless otherwise documented below in the visit note. 

## 2017-11-12 LAB — LIPID PANEL
Cholesterol: 129 mg/dL (ref 0–200)
HDL: 34.6 mg/dL — ABNORMAL LOW (ref >39.00)
NonHDL: 94.8
Total CHOL/HDL Ratio: 4
Triglycerides: 233 mg/dL — ABNORMAL HIGH (ref 0.0–149.0)
VLDL: 46.6 mg/dL — ABNORMAL HIGH (ref 0.0–40.0)

## 2017-11-12 LAB — CBC WITH DIFFERENTIAL/PLATELET
Basophils Absolute: 0 10*3/uL (ref 0.0–0.1)
Basophils Relative: 0.6 % (ref 0.0–3.0)
Eosinophils Absolute: 0.2 10*3/uL (ref 0.0–0.7)
Eosinophils Relative: 3.5 % (ref 0.0–5.0)
HCT: 46.7 % (ref 39.0–52.0)
Hemoglobin: 15.5 g/dL (ref 13.0–17.0)
Lymphocytes Relative: 38.3 % (ref 12.0–46.0)
Lymphs Abs: 2.3 10*3/uL (ref 0.7–4.0)
MCHC: 33.3 g/dL (ref 30.0–36.0)
MCV: 87.9 fl (ref 78.0–100.0)
Monocytes Absolute: 0.8 10*3/uL (ref 0.1–1.0)
Monocytes Relative: 14 % — ABNORMAL HIGH (ref 3.0–12.0)
Neutro Abs: 2.6 10*3/uL (ref 1.4–7.7)
Neutrophils Relative %: 43.6 % (ref 43.0–77.0)
Platelets: 305 10*3/uL (ref 150.0–400.0)
RBC: 5.32 Mil/uL (ref 4.22–5.81)
RDW: 13.9 % (ref 11.5–15.5)
WBC: 5.9 10*3/uL (ref 4.0–10.5)

## 2017-11-12 LAB — BASIC METABOLIC PANEL
BUN: 13 mg/dL (ref 6–23)
CO2: 34 mEq/L — ABNORMAL HIGH (ref 19–32)
Calcium: 9.3 mg/dL (ref 8.4–10.5)
Chloride: 96 mEq/L (ref 96–112)
Creatinine, Ser: 1.43 mg/dL (ref 0.40–1.50)
GFR: 63.66 mL/min (ref >60.00)
Glucose, Bld: 99 mg/dL (ref 70–99)
Potassium: 4 mEq/L (ref 3.5–5.1)
Sodium: 135 mEq/L (ref 135–145)

## 2017-11-12 LAB — HEMOGLOBIN A1C: Hgb A1c MFr Bld: 6.4 % (ref 4.6–6.5)

## 2017-11-12 LAB — PSA: PSA: 15.32 ng/mL — ABNORMAL HIGH (ref 0.10–4.00)

## 2017-11-12 LAB — LDL CHOLESTEROL, DIRECT: Direct LDL: 53 mg/dL

## 2017-11-12 NOTE — Assessment & Plan Note (Signed)
Prediabetes: Counseled, diet exercise, check a A1c HTN: Currently on losartan, HCTZ.  Check a BMP-CBC High cholesterol: On Pravachol, recent LFTs normal, check a lipid panel, not fasting. Elevated PSA: DRE today is normal, PSA increasing, advised patient he could have prostate cancer, if he delays  further evaluation cancer n=may become  noncurable.  States he will call urology, concerned about cost of care . Preventive care: TD and flu shot today RTC 6 months.

## 2017-12-06 ENCOUNTER — Ambulatory Visit (HOSPITAL_BASED_OUTPATIENT_CLINIC_OR_DEPARTMENT_OTHER)
Admission: RE | Admit: 2017-12-06 | Discharge: 2017-12-06 | Disposition: A | Discharge status: Home / Self Care | Payer: Medicare Other | Source: Ambulatory Visit | Attending: Internal Medicine | Admitting: Internal Medicine

## 2017-12-06 ENCOUNTER — Encounter: Payer: Self-pay | Admitting: Internal Medicine

## 2017-12-06 ENCOUNTER — Ambulatory Visit (INDEPENDENT_AMBULATORY_CARE_PROVIDER_SITE_OTHER): Payer: Self-pay | Admitting: Internal Medicine

## 2017-12-06 VITALS — BP 132/84 | HR 58 | Temp 97.8°F | Resp 14 | Ht 72.0 in | Wt 286.4 lb

## 2017-12-06 DIAGNOSIS — R972 Elevated prostate specific antigen [PSA]: Secondary | ICD-10-CM | POA: Diagnosis not present

## 2017-12-06 DIAGNOSIS — R109 Unspecified abdominal pain: Secondary | ICD-10-CM | POA: Insufficient documentation

## 2017-12-06 DIAGNOSIS — M5134 Other intervertebral disc degeneration, thoracic region: Secondary | ICD-10-CM | POA: Insufficient documentation

## 2017-12-06 DIAGNOSIS — M4184 Other forms of scoliosis, thoracic region: Secondary | ICD-10-CM | POA: Insufficient documentation

## 2017-12-06 LAB — POC URINALSYSI DIPSTICK (AUTOMATED)
Bilirubin, UA: NEGATIVE
Blood, UA: NEGATIVE
Glucose, UA: NEGATIVE
Ketones, UA: NEGATIVE
Leukocytes, UA: NEGATIVE
Nitrite, UA: NEGATIVE
Protein, UA: NEGATIVE
Spec Grav, UA: 1.02 (ref 1.010–1.025)
Urobilinogen, UA: 0.2 E.U./dL
pH, UA: 6 (ref 5.0–8.0)

## 2017-12-06 MED ORDER — CYCLOBENZAPRINE HCL 10 MG PO TABS: 10.0000 mg | ORAL_TABLET | Freq: Every evening | ORAL | 0 refills | Status: DC | PRN

## 2017-12-06 NOTE — Patient Instructions (Signed)
Get your x-rays downstairs  Warm compresses  Take Tylenol 500 mg 2 tablets every 8 hours as needed.  Take cyclobenzaprine, a muscle relaxant, at night as needed for pain.  Will cause drowsiness  Call if fever, rash, symptoms are not improving or they get severe.

## 2017-12-06 NOTE — Progress Notes (Signed)
Subjective:    Patient ID: Edward Stafford Quant, male    DOB: 07-26-1951, 67 y.o.   MRN: [id number]  DOS:  12/06/2017 Type of visit - description : acute Interval history: Sx started suddenly 12-02-17: Pain at the left flank, steady, no radiation, no associated rash, does not change with different positions. Does not recall any injury. Overall today the pain is slightly decreased.   Review of Systems Denies fever or cough No nausea, vomiting, diarrhea or blood in the stools. No dysuria or gross hematuria No lower extremity paresthesias   Past Medical History:  Diagnosis Date  . Back pain, chronic    disabled due to chronic back pain  . Chest pain    Cardiac cath (-)2008, Myoview (-) 2011  . Chronic kidney disease   . DIABETES MELLITUS, TYPE II, CONTROLLED 06/08/2008  . Drug abuse-- crack, marijuana 04/19/2007   hx of  . GLAUCOMA 12/19/2007  . Hyperlipidemia   . Hypertension   . SINUS BRADYCARDIA 05/17/2010    Past Surgical History:  Procedure Laterality Date  . CHOLECYSTECTOMY      Social History   Socioeconomic History  . Marital status: Single    Spouse name: Not on file  . Number of children: 0  . Years of education: Not on file  . Highest education level: Not on file  Social Needs  . Financial resource strain: Not on file  . Food insecurity - worry: Not on file  . Food insecurity - inability: Not on file  . Transportation needs - medical: Not on file  . Transportation needs - non-medical: Not on file  Occupational History  . Occupation: disable   Tobacco Use  . Smoking status: Former Smoker    Years: 5.00    Last attempt to quit: 04/17/2011    Years since quitting: 6.6  . Smokeless tobacco: Never Used  . Tobacco comment: never heavy smoker   Substance and Sexual Activity  . Alcohol use: No    Alcohol/week: 0.6 oz    Types: 1 Cans of beer per week    Frequency: Never    Comment: nobe  . Drug use: No    Comment: denies since ~ 2014, h/o marijuana-crack   . Sexual activity: Not on file  Other Topics Concern  . Not on file  Social History Narrative   Lives by himself, has a car      Allergies as of 12/06/2017   No Known Allergies     Medication List        Accurate as of 12/06/17  4:53 PM. Always use your most recent med list.          cyclobenzaprine 10 MG tablet Commonly known as:  FLEXERIL Take 1 tablet (10 mg total) by mouth at bedtime as needed for muscle spasms.   hydrochlorothiazide 25 MG tablet Commonly known as:  HYDRODIURIL Take 1 tablet (25 mg total) by mouth daily.   losartan 25 MG tablet Commonly known as:  COZAAR Take 1 tablet (25 mg total) by mouth daily.   pravastatin 20 MG tablet Commonly known as:  PRAVACHOL Take 1 tablet (20 mg total) by mouth daily.          Objective:   Physical Exam  Musculoskeletal:       Arms:  BP 132/84 (BP Location: Left Arm, Patient Position: Sitting, Cuff Size: Normal)   Pulse (!) 58   Temp 97.8 F (36.6 C) (Oral)   Resp 14   Ht 6' (  1.829 m)   Wt 286 lb 6 oz (129.9 kg)   SpO2 96%   BMI 38.84 kg/m  General:   Well developed, well nourished . NAD.  HEENT:  Normocephalic . Face symmetric, atraumatic MSK: No TTP at the thoracic spine. Abdomen:  Not distended, soft, non-tender. No rebound or rigidity.   Skin: Not pale. Not jaundice.  No rash or blisters on the back or abdomen. Neurologic:  alert & oriented X3.  Speech normal, gait appropriate for age and unassisted Psych--  Cognition and judgment appear intact.  Cooperative with normal attention span and concentration.  Behavior appropriate. No anxious or depressed appearing.     Assessment & Plan:   Assessment   Prediabetes HTN Hyperlipidemia Bradycardia, chronic (not on  BB-CCBs, asx  ekg 11-2016: sinus brady)  CKD Glaucoma Elevated PSA : 8.6  2, did not show for urology referral 3 as of 05-2016 Chronic back pain, disabled   H/o  Drug abuse, crack, marijuana  H/o CP Cath 2008 and Myoview 2011  negative  PLAN: Flank pain: DDX is large but includes MSK, shingles, kidney stones, others. Udip (-).  Probably MSK, less likely  shingles. Sx started > 2 days ago thus won't benefit from empiric acyclovir; will treat as MSK. Plan: X-ray, Tylenol, Flexeril. Increased PSA: Has not seen urology RTC 5 months as previously recommended

## 2017-12-06 NOTE — Progress Notes (Signed)
Pre visit review using our clinic review tool, if applicable. No additional management support is needed unless otherwise documented below in the visit note. 

## 2017-12-06 NOTE — Assessment & Plan Note (Signed)
Flank pain: DDX is large but includes MSK, shingles, kidney stones, others. Udip (-).  Probably MSK, less likely  shingles. Sx started > 2 days ago thus won't benefit from empiric acyclovir; will treat as MSK. Plan: X-ray, Tylenol, Flexeril. Increased PSA: Has not seen urology RTC 5 months as previously recommended

## 2017-12-26 ENCOUNTER — Other Ambulatory Visit: Payer: Self-pay | Admitting: Internal Medicine

## 2018-03-26 ENCOUNTER — Other Ambulatory Visit: Payer: Self-pay | Admitting: Internal Medicine

## 2018-05-06 ENCOUNTER — Other Ambulatory Visit: Payer: Self-pay | Admitting: Internal Medicine

## 2018-05-12 ENCOUNTER — Encounter: Payer: Self-pay | Admitting: Internal Medicine

## 2018-05-12 ENCOUNTER — Ambulatory Visit (INDEPENDENT_AMBULATORY_CARE_PROVIDER_SITE_OTHER): Payer: Medicare Other | Admitting: Internal Medicine

## 2018-05-12 VITALS — BP 134/76 | HR 53 | Temp 98.1°F | Resp 16 | Ht 72.0 in | Wt 288.0 lb

## 2018-05-12 DIAGNOSIS — E118 Type 2 diabetes mellitus with unspecified complications: Secondary | ICD-10-CM

## 2018-05-12 DIAGNOSIS — E782 Mixed hyperlipidemia: Secondary | ICD-10-CM | POA: Diagnosis not present

## 2018-05-12 DIAGNOSIS — R972 Elevated prostate specific antigen [PSA]: Secondary | ICD-10-CM | POA: Diagnosis not present

## 2018-05-12 DIAGNOSIS — I10 Essential (primary) hypertension: Secondary | ICD-10-CM | POA: Diagnosis not present

## 2018-05-12 LAB — AST: AST: 16 U/L (ref 0–37)

## 2018-05-12 LAB — BASIC METABOLIC PANEL
BUN: 8 mg/dL (ref 6–23)
CO2: 30 mEq/L (ref 19–32)
Calcium: 9.4 mg/dL (ref 8.4–10.5)
Chloride: 99 mEq/L (ref 96–112)
Creatinine, Ser: 1.43 mg/dL (ref 0.40–1.50)
GFR: 63.56 mL/min (ref >60.00)
Glucose, Bld: 98 mg/dL (ref 70–99)
Potassium: 3.9 mEq/L (ref 3.5–5.1)
Sodium: 136 mEq/L (ref 135–145)

## 2018-05-12 LAB — HEMOGLOBIN A1C: Hgb A1c MFr Bld: 6.3 % (ref 4.6–6.5)

## 2018-05-12 LAB — ALT: ALT: 20 U/L (ref 0–53)

## 2018-05-12 NOTE — Progress Notes (Signed)
Subjective:    Patient ID: Edward Stafford, male    DOB: June 14, 1951, 67 y.o.   MRN: [id number]  DOS:  05/12/2018 Type of visit - description : rov Interval history: Patient has no concerns, states he is feeling well. Reports good compliance with medications, no ambulatory BPs.   Review of Systems Denies fever chills No chest pain no difficulty breathing No nausea, vomiting. No dysuria, gross hematuria difficulty urinating Past Medical History:  Diagnosis Date  . Back pain, chronic    disabled due to chronic back pain  . Chest pain    Cardiac cath (-)2008, Myoview (-) 2011  . Chronic kidney disease   . DIABETES MELLITUS, TYPE II, CONTROLLED 06/08/2008  . Drug abuse-- crack, marijuana 04/19/2007   hx of  . Glaucoma   . Hyperlipidemia   . Hypertension   . SINUS BRADYCARDIA 05/17/2010    Past Surgical History:  Procedure Laterality Date  . CHOLECYSTECTOMY      Social History   Socioeconomic History  . Marital status: Single    Spouse name: Not on file  . Number of children: 0  . Years of education: Not on file  . Highest education level: Not on file  Occupational History  . Occupation: disable   Social Needs  . Financial resource strain: Not on file  . Food insecurity:    Worry: Not on file    Inability: Not on file  . Transportation needs:    Medical: Not on file    Non-medical: Not on file  Tobacco Use  . Smoking status: Former Smoker    Years: 5.00    Last attempt to quit: 04/17/2011    Years since quitting: 7.0  . Smokeless tobacco: Never Used  . Tobacco comment: never heavy smoker   Substance and Sexual Activity  . Alcohol use: No    Frequency: Never    Comment: none  . Drug use: No    Comment: denies since ~ 2014, h/o marijuana-crack  . Sexual activity: Not on file  Lifestyle  . Physical activity:    Days per week: Not on file    Minutes per session: Not on file  . Stress: Not on file  Relationships  . Social connections:    Talks on  phone: Not on file    Gets together: Not on file    Attends religious service: Not on file    Active member of club or organization: Not on file    Attends meetings of clubs or organizations: Not on file    Relationship status: Not on file  . Intimate partner violence:    Fear of current or ex partner: Not on file    Emotionally abused: Not on file    Physically abused: Not on file    Forced sexual activity: Not on file  Other Topics Concern  . Not on file  Social History Narrative   Lives by himself, has a car      Allergies as of 05/12/2018   No Known Allergies     Medication List        Accurate as of 05/12/18 11:59 PM. Always use your most recent med list.          hydrochlorothiazide 25 MG tablet Commonly known as:  HYDRODIURIL Take 1 tablet (25 mg total) by mouth daily.   losartan 25 MG tablet Commonly known as:  COZAAR Take 1 tablet (25 mg total) by mouth daily.   pravastatin 20 MG tablet  Commonly known as:  PRAVACHOL Take 1 tablet (20 mg total) by mouth daily.          Objective:   Physical Exam BP 134/76 (BP Location: Left Arm, Patient Position: Sitting, Cuff Size: Normal)   Pulse (!) 53   Temp 98.1 F (36.7 C) (Oral)   Resp 16   Ht 6' (1.829 m)   Wt 288 lb (130.6 kg)   SpO2 96%   BMI 39.06 kg/m  General:   Well developed, NAD, see BMI.  HEENT:  Normocephalic . Face symmetric, atraumatic Lungs:  CTA B Normal respiratory effort, no intercostal retractions, no accessory muscle use. Heart: RRR,  no murmur.  Skin: Not pale. Not jaundice Neurologic:  alert & oriented X3.  Speech normal, gait appropriate for age and unassisted Psych--  Cognition and judgment appear intact.  Cooperative with normal attention span and concentration.  Behavior appropriate. No anxious or depressed appearing.      Assessment & Plan:   Assessment   Prediabetes HTN Hyperlipidemia Bradycardia, chronic (not on  BB-CCBs, asx  ekg 11-2016: sinus brady)   CKD Glaucoma Elevated PSA : 8.6  2, did not show for urology referral 3 as of 05-2016 Chronic back pain, disabled   H/o  Drug abuse, crack, marijuana  H/o CP Cath 2008 and Myoview 2011 negative  PLAN: Prediabetes: Check A1c HTN: On losartan, HCTZ, no ambulatory BPs, BP today is very good, checking labs BMP. Increased PSA: In no uncertain terms I told the patient I think he has prostate cancer, states that he "feels well", told pt that does not mean he is healthy.  Explained that by delaying the evaluation he is taking a big risk; if indeed has cancer, his treatment options will be less and he may become noncurable. I also told him that he could get financial assistance if that is one of his concern. Declined a referral.  Urology  phone number provided RTC 6 months, fasting, CPX

## 2018-05-12 NOTE — Progress Notes (Signed)
Pre visit review using our clinic review tool, if applicable. No additional management support is needed unless otherwise documented below in the visit note. 

## 2018-05-12 NOTE — Patient Instructions (Addendum)
GO TO THE LAB : Get the blood work     GO TO THE FRONT DESK Schedule your next appointment for a  Physical exam in 6 months    You need to see the urologist ASAP, please call them at [phone]  If they request a referral let me know and I will send a referral immediately.

## 2018-05-13 DIAGNOSIS — R972 Elevated prostate specific antigen [PSA]: Secondary | ICD-10-CM | POA: Insufficient documentation

## 2018-05-13 NOTE — Assessment & Plan Note (Signed)
Prediabetes: Check A1c HTN: On losartan, HCTZ, no ambulatory BPs, BP today is very good, checking labs BMP. Increased PSA: In no uncertain terms I told the patient I think he has prostate cancer, states that he "feels well", told pt that does not mean he is healthy.  Explained that by delaying the evaluation he is taking a big risk; if indeed has cancer, his treatment options will be less and he may become noncurable. I also told him that he could get financial assistance if that is one of his concern. Declined a referral.  Urology  phone number provided RTC 6 months, fasting, CPX

## 2018-06-26 DIAGNOSIS — C61 Malignant neoplasm of prostate: Secondary | ICD-10-CM

## 2018-06-26 HISTORY — DX: Malignant neoplasm of prostate: C61

## 2018-07-08 DIAGNOSIS — R972 Elevated prostate specific antigen [PSA]: Secondary | ICD-10-CM | POA: Diagnosis not present

## 2018-07-14 DIAGNOSIS — R300 Dysuria: Secondary | ICD-10-CM | POA: Diagnosis not present

## 2018-08-01 DIAGNOSIS — C61 Malignant neoplasm of prostate: Secondary | ICD-10-CM | POA: Insufficient documentation

## 2018-08-18 DIAGNOSIS — R3915 Urgency of urination: Secondary | ICD-10-CM | POA: Diagnosis not present

## 2018-08-18 DIAGNOSIS — R300 Dysuria: Secondary | ICD-10-CM | POA: Diagnosis not present

## 2018-08-20 ENCOUNTER — Encounter: Payer: Self-pay | Admitting: Radiation Oncology

## 2018-09-03 ENCOUNTER — Encounter: Payer: Self-pay | Admitting: Radiation Oncology

## 2018-09-09 ENCOUNTER — Encounter: Payer: Self-pay | Admitting: Radiation Oncology

## 2018-09-09 NOTE — Progress Notes (Signed)
GU Location of Tumor / Histology: prostatic adenocarcinoma  If Prostate Cancer, Gleason Score is (3 + 4) and PSA is (15.4). Prostate volume: 34 cc.   Parry L Thilges was referred by his PCP, Kathlene November, MD, to Dr. Louis Meckel in July for further evaluation of an elevated PSA. Patient reports [name] told him a year ago to go to see a urologist about his high PSA but he didn't because he was scared.   Biopsies of prostate (if applicable) revealed:    Past/Anticipated interventions by urology, if any: prostate biopsy, myrbetriq, nitrofurantoin, referral to Dr. Tammi Klippel for consideration of brachytherapy  Past/Anticipated interventions by medical oncology, if any: no  Weight changes, if any: no  Bowel/Bladder complaints, if any: Reports dysuria resolved with Myrbetriq. Reports urge incontinence is significantly less with Myrbetriq. Reports incomplete emptying of bladder and urinary urgency. Denies hematuria. IPSS 18. SHIM 8.   Nausea/Vomiting, if any: no  Pain issues, if any:  no  SAFETY ISSUES:  Prior radiation? no  Pacemaker/ICD? no  Possible current pregnancy? no  Is the patient on methotrexate? no  Current Complaints / other details:  67 year old male. Single. No children. Disabled. Preferred name: Edward Stafford. Accompanied today by brother [name]. [name] normally drives the patient but request appointments be early in the morning. Patient resides in [place] off [address] alone.

## 2018-09-10 ENCOUNTER — Other Ambulatory Visit: Payer: Self-pay

## 2018-09-10 ENCOUNTER — Ambulatory Visit
Admission: RE | Admit: 2018-09-10 | Discharge: 2018-09-10 | Disposition: A | Discharge status: Home / Self Care | Payer: Medicare Other | Source: Ambulatory Visit | Attending: Radiation Oncology | Admitting: Radiation Oncology

## 2018-09-10 ENCOUNTER — Encounter: Payer: Self-pay | Admitting: Radiation Oncology

## 2018-09-10 VITALS — BP 136/93 | HR 91 | Temp 99.6°F | Resp 18 | Ht 72.0 in | Wt 277.2 lb

## 2018-09-10 DIAGNOSIS — C61 Malignant neoplasm of prostate: Secondary | ICD-10-CM | POA: Diagnosis present

## 2018-09-10 DIAGNOSIS — Z79899 Other long term (current) drug therapy: Secondary | ICD-10-CM | POA: Diagnosis not present

## 2018-09-10 DIAGNOSIS — Z87891 Personal history of nicotine dependence: Secondary | ICD-10-CM | POA: Diagnosis not present

## 2018-09-10 DIAGNOSIS — I129 Hypertensive chronic kidney disease with stage 1 through stage 4 chronic kidney disease, or unspecified chronic kidney disease: Secondary | ICD-10-CM | POA: Diagnosis not present

## 2018-09-10 DIAGNOSIS — N189 Chronic kidney disease, unspecified: Secondary | ICD-10-CM | POA: Diagnosis not present

## 2018-09-10 DIAGNOSIS — Z7982 Long term (current) use of aspirin: Secondary | ICD-10-CM | POA: Insufficient documentation

## 2018-09-10 DIAGNOSIS — Z955 Presence of coronary angioplasty implant and graft: Secondary | ICD-10-CM | POA: Insufficient documentation

## 2018-09-10 DIAGNOSIS — E1122 Type 2 diabetes mellitus with diabetic chronic kidney disease: Secondary | ICD-10-CM | POA: Insufficient documentation

## 2018-09-10 NOTE — Progress Notes (Signed)
See progress note under physician encounter. 

## 2018-09-10 NOTE — Progress Notes (Signed)
Radiation Oncology         [phone]) [phone] ________________________________  Initial Outpatient Consultation  Name: Edward Stafford MRN: [id number]  Date: 09/10/2018  DOB: [birth year]  CC:Paz, Alda Berthold, MD  Ardis Hughs, MD   REFERRING PHYSICIAN: Ardis Hughs, MD  DIAGNOSIS: 67 y.o. gentleman with Stage T1c adenocarcinoma of the prostate with Gleason Score of 3+4, and PSA of 15.4.    ICD-10-CM   1. Malignant neoplasm of prostate ([place]) C61     HISTORY OF PRESENT ILLNESS: Edward Stafford is a 67 y.o. male with a diagnosis of prostate cancer. He was noted to have an elevated PSA of 8.6 by his primary care physician, [name], in 01/2015.  Accordingly, he was referred for evaluation in urology by Dr. Louis Meckel, but he refused referral until 2019 when his lower urinary tract symptoms became worse.  He saw Dr. Louis Meckel on 06/09/18,  digital rectal examination was performed at that time revealing bilateral lobe firmness but without discrete nodularity.  The patient proceeded to transrectal ultrasound with 12 biopsies of the prostate on 07/08/18.  The prostate volume measured 34 cc. PSA at time of biopsy was 15.4.  Out of 12 core biopsies, 5 were positive.  The maximum Gleason score was 3+4, and this was seen in left apex, left mid lateral, and right apex. Perineural invasion was noted in the right apex lateral.  PSA trend: 05/2018   15.40 04/2017   15.3 04/2016   11.16 07/2015   8.66 12/2012   7.8  The patient reviewed the biopsy results with his urologist and he has kindly been referred today for discussion of potential radiation treatment options.   PREVIOUS RADIATION THERAPY: No  PAST MEDICAL HISTORY:  Past Medical History:  Diagnosis Date  . Back pain, chronic    disabled due to chronic back pain  . Chest pain    Cardiac cath (-)2008, Myoview (-) 2011  . Chronic kidney disease   . DIABETES MELLITUS, TYPE II, CONTROLLED 06/08/2008  . Drug abuse-- crack, marijuana 04/19/2007     hx of  . Glaucoma   . Hyperlipidemia   . Hypertension   . Prostate cancer ([place]) 06/2018  . SINUS BRADYCARDIA 05/17/2010      PAST SURGICAL HISTORY: Past Surgical History:  Procedure Laterality Date  . CHOLECYSTECTOMY    . CORONARY ANGIOPLASTY WITH STENT PLACEMENT    . PROSTATE BIOPSY      FAMILY HISTORY:  Family History  Problem Relation Age of Onset  . CAD Mother        age onset ?  . Colon cancer Other        cousin  . Stroke Neg Hx   . Prostate cancer Neg Hx   . Diabetes Neg Hx   . Breast cancer Neg Hx   . Pancreatic cancer Neg Hx     SOCIAL HISTORY: lives at home with his brother Social History   Socioeconomic History  . Marital status: Single    Spouse name: Not on file  . Number of children: 0  . Years of education: Not on file  . Highest education level: Not on file  Occupational History  . Occupation: Disabled.   Social Needs  . Financial resource strain: Not on file  . Food insecurity:    Worry: Not on file    Inability: Not on file  . Transportation needs:    Medical: Not on file    Non-medical: Not on file  Tobacco Use  .  Smoking status: Former Smoker    Packs/day: 0.50    Years: 15.00    Pack years: 7.50    Types: Cigarettes    Last attempt to quit: 06/15/2013    Years since quitting: 5.2  . Smokeless tobacco: Never Used  . Tobacco comment: never heavy smoker   Substance and Sexual Activity  . Alcohol use: No    Frequency: Never    Comment: none  . Drug use: Not Currently    Comment: denies since ~ 2014, h/o marijuana-crack  . Sexual activity: Not Currently  Lifestyle  . Physical activity:    Days per week: Not on file    Minutes per session: Not on file  . Stress: Not on file  Relationships  . Social connections:    Talks on phone: Not on file    Gets together: Not on file    Attends religious service: Not on file    Active member of club or organization: Not on file    Attends meetings of clubs or organizations: Not on file     Relationship status: Not on file  . Intimate partner violence:    Fear of current or ex partner: Not on file    Emotionally abused: Not on file    Physically abused: Not on file    Forced sexual activity: Not on file  Other Topics Concern  . Not on file  Social History Narrative   Lives by himself, has a car.     ALLERGIES: Patient has no known allergies.  MEDICATIONS:  Current Outpatient Medications  Medication Sig Dispense Refill  . aspirin EC 81 MG tablet Take 81 mg by mouth daily.    . hydrochlorothiazide (HYDRODIURIL) 25 MG tablet Take 1 tablet (25 mg total) by mouth daily. 90 tablet 1  . losartan (COZAAR) 25 MG tablet Take 1 tablet (25 mg total) by mouth daily. 90 tablet 0  . mirabegron ER (MYRBETRIQ) 50 MG TB24 tablet Take 50 mg by mouth daily.    . nitrofurantoin (MACRODANTIN) 50 MG capsule Take 50 mg by mouth 4 (four) times daily.    . pravastatin (PRAVACHOL) 20 MG tablet Take 1 tablet (20 mg total) by mouth daily. 90 tablet 1   No current facility-administered medications for this encounter.     REVIEW OF SYSTEMS:  On review of systems, the patient reports that he is doing well overall. He denies any chest pain, shortness of breath, cough, fevers, chills, night sweats, unintended weight changes. He denies abdominal pain, nausea or vomiting. He reports incomplete bladder emptying and urinary urgency. He states resolved dysuria and improved incontinence with Myrbetriq.  He denies any new musculoskeletal or joint aches or pains. His IPSS was 18, indicating moderate-severe urinary symptoms. He is unable to complete sexual activity with most attempts. A complete review of systems is obtained and is otherwise negative. He is accompanied by his brother.    PHYSICAL EXAM:  Wt Readings from Last 3 Encounters:  09/10/18 277 lb 3.2 oz (125.7 kg)  05/12/18 288 lb (130.6 kg)  12/06/17 286 lb 6 oz (129.9 kg)   Temp Readings from Last 3 Encounters:  09/10/18 99.6 F (37.6 C)   05/12/18 98.1 F (36.7 C) (Oral)  12/06/17 97.8 F (36.6 C) (Oral)   BP Readings from Last 3 Encounters:  09/10/18 (!) 136/93  05/12/18 134/76  12/06/17 132/84   Pulse Readings from Last 3 Encounters:  09/10/18 91  05/12/18 (!) 53  12/06/17 (!) 58  Pain Assessment Pain Score: 0-No pain/10  In general this is a well appearing African American gentleman in no acute distress. He is alert and oriented x4 and appropriate throughout the examination. HEENT reveals that the patient is normocephalic, atraumatic. EOMs are intact. PERRLA. Skin is intact without any evidence of gross lesions. Cardiovascular exam reveals a regular rate and rhythm, no clicks rubs or murmurs are auscultated. Chest is clear to auscultation bilaterally. Lymphatic assessment is performed and does not reveal any adenopathy in the cervical, supraclavicular, axillary, or inguinal chains. Abdomen has active bowel sounds in all quadrants and is intact. The abdomen is soft, non tender, non distended. Lower extremities are negative for pretibial pitting edema, deep calf tenderness, cyanosis or clubbing.   KPS = 90  100 - Normal; no complaints; no evidence of disease. 90   - Able to carry on normal activity; minor signs or symptoms of disease. 80   - Normal activity with effort; some signs or symptoms of disease. 77   - Cares for self; unable to carry on normal activity or to do active work. 60   - Requires occasional assistance, but is able to care for most of his personal needs. 50   - Requires considerable assistance and frequent medical care. 36   - Disabled; requires special care and assistance. 28   - Severely disabled; hospital admission is indicated although death not imminent. 7   - Very sick; hospital admission necessary; active supportive treatment necessary. 10   - Moribund; fatal processes progressing rapidly. 0     - Dead  Karnofsky DA, Abelmann [place], Craver LS and Burchenal [facility] [phone]) The use of the nitrogen  mustards in the palliative treatment of carcinoma: with particular reference to bronchogenic carcinoma Cancer 1 634-56  LABORATORY DATA:  Lab Results  Component Value Date   WBC 5.9 11/11/2017   HGB 15.5 11/11/2017   HCT 46.7 11/11/2017   MCV 87.9 11/11/2017   PLT 305.0 11/11/2017   Lab Results  Component Value Date   NA 136 05/12/2018   K 3.9 05/12/2018   CL 99 05/12/2018   CO2 30 05/12/2018   Lab Results  Component Value Date   ALT 20 05/12/2018   AST 16 05/12/2018   ALKPHOS 96 06/13/2017   BILITOT 0.7 06/13/2017     RADIOGRAPHY: No results found.    IMPRESSION/PLAN: 1. 67 y.o. gentleman with Stage T1c adenocarcinoma of the prostate with Gleason Score of 3+4, and PSA of 15.4. We discussed the patient's workup and outlined the nature of prostate cancer in this setting. The patient's T stage, Gleason's score, and PSA put him into the favorable intermediate risk group. Accordingly, he is eligible for a variety of potential treatment options including brachytherapy, 5.5 - 8 weeks of external radiation or 5 weeks of external radiation followed by a brachytherapy boost. We discussed the available radiation techniques, and focused on the details and logistics and delivery.  We discussed and outlined the risks, benefits, short and long-term effects associated with radiotherapy and compared and contrasted these with prostatectomy. We discussed the role of SpaceOAR in reducing the rectal toxicity associated with radiotherapy.   At the conclusion of our conversation, the patient is interested in moving forward with brachytherapy and use of SpaceOAR to reduce rectal toxicity from radiotherapy.  We will share our discussion with Dr. Louis Meckel and move forward with scheduling his [facility] planning appointment in the near future.  The patient met briefly with Romie Jumper in our office  who will be working closely with him to coordinate OR scheduling and pre and post procedure appointments.  We will  contact the pharmaceutical rep to ensure that Edward Stafford is available at the time of procedure.  He will have a prostate MRI following his post-seed CT SIM to confirm appropriate distribution of the Edward Stafford.    Nicholos Johns, PA-C    Tyler Pita, MD  [facility] Direct Dial: [phone]  Fax: [phone] [email or web address]  Skype  LinkedIn  This document serves as a record of services personally performed by Tyler Pita, MD and Freeman Caldron, PA-C. It was created on their behalf by Wilburn Mylar, a trained medical scribe. The creation of this record is based on the scribe's personal observations and the provider's statements to them. This document has been checked and approved by the attending provider.

## 2018-09-12 ENCOUNTER — Other Ambulatory Visit: Payer: Self-pay | Admitting: Urology

## 2018-09-15 ENCOUNTER — Ambulatory Visit: Payer: Medicare Other | Admitting: Radiation Oncology

## 2018-09-15 ENCOUNTER — Ambulatory Visit: Payer: Medicare Other

## 2018-09-15 ENCOUNTER — Telehealth: Payer: Self-pay | Admitting: *Deleted

## 2018-09-15 ENCOUNTER — Encounter

## 2018-09-15 NOTE — Telephone Encounter (Signed)
Called patient to inform of pre-seed planning CT and implant date, lvm for a return call 

## 2018-09-18 ENCOUNTER — Encounter: Payer: Self-pay | Admitting: Medical Oncology

## 2018-09-18 NOTE — Progress Notes (Signed)
Left message with patient to introduce myself as the prostate nurse navigator and my role. I was unable to meet him 09/10/18 when he consulted with Dr. Tammi Klippel. He is scheduled for CT simulation 11/1 and brachytherapy 12/20. I asked him to call me back to confirm these appointments.

## 2018-09-21 ENCOUNTER — Other Ambulatory Visit: Payer: Self-pay | Admitting: Internal Medicine

## 2018-09-25 ENCOUNTER — Telehealth: Payer: Self-pay | Admitting: *Deleted

## 2018-09-25 NOTE — Telephone Encounter (Signed)
Called patient to remind of pre-seed appts. for 09-26-18, spoke with patient and he is aware of these appts.

## 2018-09-26 ENCOUNTER — Other Ambulatory Visit: Payer: Self-pay

## 2018-09-26 ENCOUNTER — Ambulatory Visit (HOSPITAL_COMMUNITY)
Admission: RE | Admit: 2018-09-26 | Discharge: 2018-09-26 | Disposition: A | Discharge status: Home / Self Care | Payer: Medicare Other | Source: Ambulatory Visit | Attending: Urology | Admitting: Urology

## 2018-09-26 ENCOUNTER — Ambulatory Visit
Admission: RE | Admit: 2018-09-26 | Discharge: 2018-09-26 | Disposition: A | Discharge status: Home / Self Care | Payer: Medicare Other | Source: Ambulatory Visit | Attending: Radiation Oncology | Admitting: Radiation Oncology

## 2018-09-26 ENCOUNTER — Encounter (HOSPITAL_COMMUNITY)
Admission: RE | Admit: 2018-09-26 | Discharge: 2018-09-26 | Disposition: A | Discharge status: Home / Self Care | Payer: Medicare Other | Source: Ambulatory Visit | Attending: Urology | Admitting: Urology

## 2018-09-26 ENCOUNTER — Other Ambulatory Visit: Payer: Self-pay | Admitting: Internal Medicine

## 2018-09-26 DIAGNOSIS — C61 Malignant neoplasm of prostate: Secondary | ICD-10-CM

## 2018-09-26 DIAGNOSIS — R001 Bradycardia, unspecified: Secondary | ICD-10-CM | POA: Diagnosis not present

## 2018-09-26 NOTE — Progress Notes (Signed)
  Radiation Oncology         [phone] ________________________________  Name: LABAN OROURKE MRN: [id number]  Date: 09/26/2018  DOB: 29-May-1951  SIMULATION AND TREATMENT PLANNING NOTE PUBIC ARCH STUDY  CC:Paz, Alda Berthold, MD  Ardis Hughs, MD  DIAGNOSIS: 67 y.o. gentleman with Stage T1c adenocarcinoma of the prostate with Gleason Score of 3+4, and PSA of 15.4     ICD-10-CM   1. Prostate cancer ([place]) C61     COMPLEX SIMULATION:  The patient presented today for evaluation for possible prostate seed implant. He was brought to the radiation planning suite and placed supine on the CT couch. A 3-dimensional image study set was obtained in upload to the planning computer. There, on each axial slice, I contoured the prostate gland. Then, using three-dimensional radiation planning tools I reconstructed the prostate in view of the structures from the transperineal needle pathway to assess for possible pubic arch interference. In doing so, I did not appreciate any pubic arch interference. Also, the patient's prostate volume was estimated based on the drawn structure. The volume was 35 cc.  Given the pubic arch appearance and prostate volume, patient remains a good candidate to proceed with prostate seed implant. Today, he freely provided informed written consent to proceed.    PLAN: The patient will undergo prostate seed implant.   ________________________________  Sheral Apley. Tammi Klippel, M.D.

## 2018-10-02 ENCOUNTER — Other Ambulatory Visit: Payer: Self-pay | Admitting: Urology

## 2018-10-02 DIAGNOSIS — C61 Malignant neoplasm of prostate: Secondary | ICD-10-CM

## 2018-11-06 ENCOUNTER — Telehealth: Payer: Self-pay | Admitting: *Deleted

## 2018-11-06 ENCOUNTER — Other Ambulatory Visit: Payer: Self-pay

## 2018-11-06 MED ORDER — PRAVASTATIN SODIUM 20 MG PO TABS: 20.0000 mg | ORAL_TABLET | Freq: Every day | ORAL | 1 refills | Status: DC

## 2018-11-06 NOTE — Telephone Encounter (Signed)
Called patient to remind of lab appt. for 11-07-18- arrival time - 9:45 am @ [facility] Admitting, spoke with patient and he is aware of this appt.

## 2018-11-07 ENCOUNTER — Other Ambulatory Visit (HOSPITAL_COMMUNITY): Payer: Medicare Other

## 2018-11-07 ENCOUNTER — Encounter (HOSPITAL_COMMUNITY)
Admission: RE | Admit: 2018-11-07 | Discharge: 2018-11-07 | Disposition: A | Discharge status: Home / Self Care | Payer: Medicare Other | Source: Ambulatory Visit | Attending: Urology | Admitting: Urology

## 2018-11-07 DIAGNOSIS — Z01812 Encounter for preprocedural laboratory examination: Secondary | ICD-10-CM | POA: Insufficient documentation

## 2018-11-07 LAB — COMPREHENSIVE METABOLIC PANEL
ALT: 19 U/L (ref 0–44)
AST: 22 U/L (ref 15–41)
Albumin: 4.5 g/dL (ref 3.5–5.0)
Alkaline Phosphatase: 92 U/L (ref 38–126)
Anion gap: 9 (ref 5–15)
BUN: 14 mg/dL (ref 8–23)
CO2: 26 mmol/L (ref 22–32)
Calcium: 9.2 mg/dL (ref 8.9–10.3)
Chloride: 98 mmol/L (ref 98–111)
Creatinine, Ser: 1.56 mg/dL — ABNORMAL HIGH (ref 0.61–1.24)
GFR calc Af Amer: 53 mL/min — ABNORMAL LOW (ref >60)
GFR calc non Af Amer: 46 mL/min — ABNORMAL LOW (ref >60)
Glucose, Bld: 95 mg/dL (ref 70–99)
Potassium: 3.9 mmol/L (ref 3.5–5.1)
Sodium: 133 mmol/L — ABNORMAL LOW (ref 135–145)
Total Bilirubin: 1.4 mg/dL — ABNORMAL HIGH (ref 0.3–1.2)
Total Protein: 7.6 g/dL (ref 6.5–8.1)

## 2018-11-07 LAB — CBC
HCT: 46.1 % (ref 39.0–52.0)
Hemoglobin: 15 g/dL (ref 13.0–17.0)
MCH: 28.3 pg (ref 26.0–34.0)
MCHC: 32.5 g/dL (ref 30.0–36.0)
MCV: 87 fL (ref 80.0–100.0)
Platelets: 289 10*3/uL (ref 150–400)
RBC: 5.3 MIL/uL (ref 4.22–5.81)
RDW: 13.3 % (ref 11.5–15.5)
WBC: 5.5 10*3/uL (ref 4.0–10.5)
nRBC: 0 % (ref 0.0–0.2)

## 2018-11-07 LAB — APTT: aPTT: 30 seconds (ref 24–36)

## 2018-11-07 LAB — PROTIME-INR
INR: 0.95
Prothrombin Time: 12.5 seconds (ref 11.4–15.2)

## 2018-11-10 ENCOUNTER — Other Ambulatory Visit: Payer: Self-pay

## 2018-11-10 ENCOUNTER — Encounter (HOSPITAL_BASED_OUTPATIENT_CLINIC_OR_DEPARTMENT_OTHER): Payer: Self-pay | Admitting: *Deleted

## 2018-11-10 NOTE — Progress Notes (Signed)
cmet results 11-07-18 faxed to dr Louis Meckel by epic

## 2018-11-10 NOTE — Progress Notes (Signed)
Spoke with Edward Stafford after midnight, arrive 930 am 11-14-18 [facility] Fleets enema am of surgery Records on chart/epic: chest xray 09-26-18, chest xeray 09-26-18, pt, ptt, cbc, cmet 11-07-18 epic Brother kenneht Fodor driver Has surgery orders I epic

## 2018-11-11 ENCOUNTER — Ambulatory Visit (INDEPENDENT_AMBULATORY_CARE_PROVIDER_SITE_OTHER): Payer: Medicare Other | Admitting: Internal Medicine

## 2018-11-11 ENCOUNTER — Encounter: Payer: Self-pay | Admitting: Internal Medicine

## 2018-11-11 VITALS — BP 132/74 | HR 57 | Temp 98.1°F | Resp 16 | Ht 72.0 in | Wt 278.2 lb

## 2018-11-11 DIAGNOSIS — E1169 Type 2 diabetes mellitus with other specified complication: Secondary | ICD-10-CM

## 2018-11-11 DIAGNOSIS — E782 Mixed hyperlipidemia: Secondary | ICD-10-CM | POA: Diagnosis not present

## 2018-11-11 DIAGNOSIS — C61 Malignant neoplasm of prostate: Secondary | ICD-10-CM | POA: Diagnosis not present

## 2018-11-11 DIAGNOSIS — I10 Essential (primary) hypertension: Secondary | ICD-10-CM | POA: Diagnosis not present

## 2018-11-11 LAB — HEMOGLOBIN A1C: Hgb A1c MFr Bld: 6.2 % (ref 4.6–6.5)

## 2018-11-11 LAB — LIPID PANEL
Cholesterol: 114 mg/dL (ref 0–200)
HDL: 35 mg/dL — ABNORMAL LOW (ref >39.00)
LDL Cholesterol: 55 mg/dL (ref 0–99)
NonHDL: 78.83
Total CHOL/HDL Ratio: 3
Triglycerides: 117 mg/dL (ref 0.0–149.0)
VLDL: 23.4 mg/dL (ref 0.0–40.0)

## 2018-11-11 NOTE — Assessment & Plan Note (Signed)
  Prediabetes: Check A1c, encourage a healthy diet HTN: On losartan, HCTZ.  Interestingly he takes HCTZ at nighttime.  Advised patient to take it in the AM if so desired. Last BMP at baseline. High cholesterol: On Pravachol, check FLP. Prostate cancer: Dx 06/2018, to have a seed implant in 3 days. RTC CPX 6 months

## 2018-11-11 NOTE — Progress Notes (Signed)
Pre visit review using our clinic review tool, if applicable. No additional management support is needed unless otherwise documented below in the visit note. 

## 2018-11-11 NOTE — Patient Instructions (Addendum)
Please schedule Medicare Wellness with Glenard Haring.   GO TO THE LAB : Get the blood work     GO TO THE FRONT DESK Schedule your next appointment for a  Physical exam in 6 months     Check the  blood pressure 2 or 3 times a   Week  Be sure your blood pressure is between 110/65 and  135/85. If it is consistently higher or lower, let me know

## 2018-11-11 NOTE — Progress Notes (Signed)
Subjective:    Patient ID: Edward Stafford, male    DOB: 27-Feb-1951, 67 y.o.   MRN: [id number]  DOS:  11/11/2018 Type of visit - description : rov Since last OV was diagnosed with prostate cancer.  Review of Systems Emotionally doing well.  Denies depression, appropriately anxious regards to the seed implant procedure planned for this week. Denies chest pain or difficulty breathing. No nausea, vomiting, diarrhea  Past Medical History:  Diagnosis Date  . Back pain, chronic    disabled due to chronic back pain  . Chest pain    Cardiac cath (-)2008, Myoview (-) 2011  . Chronic kidney disease   . DIABETES MELLITUS, TYPE II, CONTROLLED 06/08/2008   diet controlled, pt does not check cbg   . Drug abuse-- crack, marijuana 04/19/2007   hx of  . GERD (gastroesophageal reflux disease)    no current meds  . Glaucoma   . Hyperlipidemia   . Hypertension   . Prostate cancer ([place]) 06/2018  . SINUS BRADYCARDIA 05/17/2010  . UTI (urinary tract infection)    on doxycycline for    Past Surgical History:  Procedure Laterality Date  . CHOLECYSTECTOMY    . CORONARY ANGIOPLASTY WITH STENT PLACEMENT    . PROSTATE BIOPSY      Social History   Socioeconomic History  . Marital status: Single    Spouse name: Not on file  . Number of children: 0  . Years of education: Not on file  . Highest education level: Not on file  Occupational History  . Occupation: Disabled.   Social Needs  . Financial resource strain: Not on file  . Food insecurity:    Worry: Not on file    Inability: Not on file  . Transportation needs:    Medical: Not on file    Non-medical: Not on file  Tobacco Use  . Smoking status: Former Smoker    Packs/day: 0.50    Years: 15.00    Pack years: 7.50    Types: Cigarettes    Last attempt to quit: 06/15/2013    Years since quitting: 5.4  . Smokeless tobacco: Never Used  . Tobacco comment: never heavy smoker   Substance and Sexual Activity  . Alcohol use: No   Frequency: Never    Comment: none  . Drug use: Not Currently    Comment: denies since ~ 2014, h/o marijuana-crack  . Sexual activity: Not Currently  Lifestyle  . Physical activity:    Days per week: Not on file    Minutes per session: Not on file  . Stress: Not on file  Relationships  . Social connections:    Talks on phone: Not on file    Gets together: Not on file    Attends religious service: Not on file    Active member of club or organization: Not on file    Attends meetings of clubs or organizations: Not on file    Relationship status: Not on file  . Intimate partner violence:    Fear of current or ex partner: Not on file    Emotionally abused: Not on file    Physically abused: Not on file    Forced sexual activity: Not on file  Other Topics Concern  . Not on file  Social History Narrative   Lives by himself, has a car.       Allergies as of 11/11/2018   No Known Allergies     Medication List  Accurate as of November 11, 2018  8:29 AM. Always use your most recent med list.        aspirin EC 81 MG tablet Take 81 mg by mouth daily.   doxycycline 100 MG EC tablet Commonly known as:  DORYX Take 100 mg by mouth every evening.   hydrochlorothiazide 25 MG tablet Commonly known as:  HYDRODIURIL Take 1 tablet (25 mg total) by mouth daily.   losartan 25 MG tablet Commonly known as:  COZAAR Take 1 tablet (25 mg total) by mouth daily.   mirabegron ER 50 MG Tb24 tablet Commonly known as:  MYRBETRIQ Take 50 mg by mouth every evening.   pravastatin 20 MG tablet Commonly known as:  PRAVACHOL Take 1 tablet (20 mg total) by mouth daily.           Objective:   Physical Exam BP 132/74 (BP Location: Left Arm, Patient Position: Sitting, Cuff Size: Normal)   Pulse (!) 57   Temp 98.1 F (36.7 C) (Oral)   Resp 16   Ht 6' (1.829 m)   Wt 278 lb 4 oz (126.2 kg)   SpO2 96%   BMI 37.74 kg/m  General:   Well developed, NAD, BMI noted. HEENT:    Normocephalic . Face symmetric, atraumatic Lungs:  CTA B Normal respiratory effort, no intercostal retractions, no accessory muscle use. Heart: RRR,  no murmur.  No pretibial edema bilaterally  Skin: Not pale. Not jaundice Neurologic:  alert & oriented X3.  Speech normal, gait appropriate for age and unassisted.  He did need some assistance in getting onto the examining table Psych--  Cognition and judgment appear intact.  Cooperative with normal attention span and concentration.  Behavior appropriate. No anxious or depressed appearing.      Assessment & Plan:    Assessment   Prediabetes HTN Hyperlipidemia Bradycardia, chronic (not on  BB-CCBs, asx  ekg 11-2016: sinus brady)  CKD Glaucoma Prostate  ca: --Elevated PSA : 8.6  2, did not show for urology referral 3 as of 05-2016 -- 06/2018 (+) bx for cancer  Chronic back pain, disabled   H/o  Drug abuse, crack, marijuana  H/o CP Cath 2008 and Myoview 2011 negative  PLAN: Prediabetes: Check A1c, encourage a healthy diet HTN: On losartan, HCTZ.  Interestingly he takes HCTZ at nighttime.  Advised patient to take it in the AM if so desired. Last BMP at baseline. High cholesterol: On Pravachol, check FLP. Prostate cancer: Dx 06/2018, to have a seed implant in 3 days. RTC CPX 6 months

## 2018-11-13 ENCOUNTER — Telehealth: Payer: Self-pay | Admitting: *Deleted

## 2018-11-13 NOTE — Telephone Encounter (Signed)
CALLED PATIENT TO REMIND OF PROCEDURE FOR 11-14-18, LVM FOR A RETURN CALL

## 2018-11-14 ENCOUNTER — Encounter (HOSPITAL_BASED_OUTPATIENT_CLINIC_OR_DEPARTMENT_OTHER): Payer: Self-pay

## 2018-11-14 ENCOUNTER — Ambulatory Visit (HOSPITAL_BASED_OUTPATIENT_CLINIC_OR_DEPARTMENT_OTHER): Payer: Medicare Other | Admitting: Anesthesiology

## 2018-11-14 ENCOUNTER — Ambulatory Visit (HOSPITAL_BASED_OUTPATIENT_CLINIC_OR_DEPARTMENT_OTHER)
Admission: RE | Admit: 2018-11-14 | Discharge: 2018-11-14 | Disposition: A | Discharge status: Home / Self Care | Payer: Medicare Other | Source: Ambulatory Visit | Attending: Urology | Admitting: Urology

## 2018-11-14 ENCOUNTER — Encounter (HOSPITAL_BASED_OUTPATIENT_CLINIC_OR_DEPARTMENT_OTHER)
Admission: RE | Disposition: A | Discharge status: Home / Self Care | Payer: Self-pay | Source: Ambulatory Visit | Attending: Urology

## 2018-11-14 ENCOUNTER — Ambulatory Visit (HOSPITAL_COMMUNITY): Payer: Medicare Other

## 2018-11-14 DIAGNOSIS — I129 Hypertensive chronic kidney disease with stage 1 through stage 4 chronic kidney disease, or unspecified chronic kidney disease: Secondary | ICD-10-CM | POA: Insufficient documentation

## 2018-11-14 DIAGNOSIS — E785 Hyperlipidemia, unspecified: Secondary | ICD-10-CM | POA: Diagnosis not present

## 2018-11-14 DIAGNOSIS — Z79899 Other long term (current) drug therapy: Secondary | ICD-10-CM | POA: Insufficient documentation

## 2018-11-14 DIAGNOSIS — Z6838 Body mass index (BMI) 38.0-38.9, adult: Secondary | ICD-10-CM | POA: Diagnosis not present

## 2018-11-14 DIAGNOSIS — C61 Malignant neoplasm of prostate: Secondary | ICD-10-CM | POA: Insufficient documentation

## 2018-11-14 DIAGNOSIS — N3941 Urge incontinence: Secondary | ICD-10-CM | POA: Insufficient documentation

## 2018-11-14 DIAGNOSIS — E1122 Type 2 diabetes mellitus with diabetic chronic kidney disease: Secondary | ICD-10-CM | POA: Insufficient documentation

## 2018-11-14 DIAGNOSIS — I2510 Atherosclerotic heart disease of native coronary artery without angina pectoris: Secondary | ICD-10-CM | POA: Diagnosis not present

## 2018-11-14 DIAGNOSIS — Z7982 Long term (current) use of aspirin: Secondary | ICD-10-CM | POA: Diagnosis not present

## 2018-11-14 DIAGNOSIS — N189 Chronic kidney disease, unspecified: Secondary | ICD-10-CM | POA: Insufficient documentation

## 2018-11-14 DIAGNOSIS — Z87891 Personal history of nicotine dependence: Secondary | ICD-10-CM | POA: Diagnosis not present

## 2018-11-14 DIAGNOSIS — Z955 Presence of coronary angioplasty implant and graft: Secondary | ICD-10-CM | POA: Diagnosis not present

## 2018-11-14 HISTORY — DX: Urinary tract infection, site not specified: N39.0

## 2018-11-14 HISTORY — DX: Gastro-esophageal reflux disease without esophagitis: K21.9

## 2018-11-14 HISTORY — PX: SPACE OAR INSTILLATION: SHX6769

## 2018-11-14 HISTORY — PX: RADIOACTIVE SEED IMPLANT: SHX5150

## 2018-11-14 LAB — GLUCOSE, CAPILLARY
Glucose-Capillary: 101 mg/dL — ABNORMAL HIGH (ref 70–99)
Glucose-Capillary: 99 mg/dL (ref 70–99)

## 2018-11-14 SURGERY — INSERTION, RADIATION SOURCE, PROSTATE
Anesthesia: General

## 2018-11-14 MED ORDER — EPHEDRINE SULFATE 50 MG/ML IJ SOLN
INTRAMUSCULAR | Status: DC | PRN
  Administered 2018-11-14 (×3): 10 mg via INTRAVENOUS

## 2018-11-14 MED ORDER — CIPROFLOXACIN IN D5W 400 MG/200ML IV SOLN
400.0000 mg | INTRAVENOUS | Status: AC
  Administered 2018-11-14: 400 mg via INTRAVENOUS
  Filled 2018-11-14: qty 200

## 2018-11-14 MED ORDER — FLEET ENEMA 7-19 GM/118ML RE ENEM
1.0000 | ENEMA | Freq: Once | RECTAL | Status: AC
  Administered 2018-11-14: 1 via RECTAL
  Filled 2018-11-14: qty 1

## 2018-11-14 MED ORDER — TRAMADOL HCL 50 MG PO TABS: 50.0000 mg | ORAL_TABLET | Freq: Four times a day (QID) | ORAL | 0 refills | Status: DC | PRN

## 2018-11-14 MED ORDER — PROPOFOL 10 MG/ML IV BOLUS
INTRAVENOUS | Status: AC
  Filled 2018-11-14: qty 20

## 2018-11-14 MED ORDER — PHENAZOPYRIDINE HCL 200 MG PO TABS: 200.0000 mg | ORAL_TABLET | Freq: Three times a day (TID) | ORAL | 0 refills | Status: DC | PRN

## 2018-11-14 MED ORDER — IOHEXOL 300 MG/ML  SOLN
INTRAMUSCULAR | Status: DC | PRN
  Administered 2018-11-14: 7 mL via URETHRAL

## 2018-11-14 MED ORDER — SODIUM CHLORIDE 0.9 % IV SOLN
INTRAVENOUS | Status: AC | PRN
  Administered 2018-11-14: 1000 mL

## 2018-11-14 MED ORDER — GLYCOPYRROLATE 0.2 MG/ML IJ SOLN
INTRAMUSCULAR | Status: DC | PRN
  Administered 2018-11-14: .2 mg via INTRAVENOUS

## 2018-11-14 MED ORDER — ONDANSETRON HCL 4 MG/2ML IJ SOLN
INTRAMUSCULAR | Status: DC | PRN
  Administered 2018-11-14: 4 mg via INTRAVENOUS

## 2018-11-14 MED ORDER — SODIUM CHLORIDE 0.9 % IV SOLN
INTRAVENOUS | Status: DC
  Administered 2018-11-14: 1000 mL via INTRAVENOUS
  Administered 2018-11-14: 50 mL/h via INTRAVENOUS
  Filled 2018-11-14: qty 1000

## 2018-11-14 MED ORDER — CIPROFLOXACIN IN D5W 400 MG/200ML IV SOLN
INTRAVENOUS | Status: AC
  Filled 2018-11-14: qty 200

## 2018-11-14 MED ORDER — LACTATED RINGERS IV SOLN
INTRAVENOUS | Status: DC | PRN
  Administered 2018-11-14: 13:00:00 via INTRAVENOUS

## 2018-11-14 MED ORDER — SUCCINYLCHOLINE CHLORIDE 200 MG/10ML IV SOSY
PREFILLED_SYRINGE | INTRAVENOUS | Status: DC | PRN
  Administered 2018-11-14: 120 mg via INTRAVENOUS

## 2018-11-14 MED ORDER — FENTANYL CITRATE (PF) 100 MCG/2ML IJ SOLN
INTRAMUSCULAR | Status: AC
  Filled 2018-11-14: qty 2

## 2018-11-14 MED ORDER — MIDAZOLAM HCL 2 MG/2ML IJ SOLN
INTRAMUSCULAR | Status: AC
  Filled 2018-11-14: qty 2

## 2018-11-14 MED ORDER — FENTANYL CITRATE (PF) 100 MCG/2ML IJ SOLN
INTRAMUSCULAR | Status: DC | PRN
  Administered 2018-11-14: 100 ug via INTRAVENOUS

## 2018-11-14 MED ORDER — DEXAMETHASONE SODIUM PHOSPHATE 10 MG/ML IJ SOLN
INTRAMUSCULAR | Status: DC | PRN
  Administered 2018-11-14: 10 mg via INTRAVENOUS

## 2018-11-14 MED ORDER — TAMSULOSIN HCL 0.4 MG PO CAPS: 0.4000 mg | ORAL_CAPSULE | Freq: Every day | ORAL | 11 refills | Status: DC

## 2018-11-14 MED ORDER — LIDOCAINE 2% (20 MG/ML) 5 ML SYRINGE
INTRAMUSCULAR | Status: DC | PRN
  Administered 2018-11-14: 100 mg via INTRAVENOUS

## 2018-11-14 MED ORDER — PROPOFOL 10 MG/ML IV BOLUS
INTRAVENOUS | Status: DC | PRN
  Administered 2018-11-14: 150 mg via INTRAVENOUS
  Administered 2018-11-14: 50 mg via INTRAVENOUS

## 2018-11-14 MED FILL — TAMSULOSIN HCL 0.4 MG CAP: 0.4 | 30 days supply | Qty: 30 | Fill #0

## 2018-11-14 MED FILL — traMADol HCL 50 MG TABS: 50 | 2 days supply | Qty: 15 | Fill #0

## 2018-11-14 SURGICAL SUPPLY — 43 items
BAG URINE DRAINAGE (UROLOGICAL SUPPLIES) ×2 IMPLANT
BLADE CLIPPER SURG (BLADE) ×2 IMPLANT
CATH FOLEY 2WAY SLVR  5CC 16FR (CATHETERS) ×1
CATH FOLEY 2WAY SLVR 5CC 16FR (CATHETERS) ×1 IMPLANT
CATH ROBINSON RED A/P 16FR (CATHETERS) IMPLANT
CATH ROBINSON RED A/P 20FR (CATHETERS) ×2 IMPLANT
CLOTH BEACON ORANGE TIMEOUT ST (SAFETY) ×2 IMPLANT
CONT SPECI 4OZ STER CLIK (MISCELLANEOUS) ×4 IMPLANT
COVER BACK TABLE 60X90IN (DRAPES) ×2 IMPLANT
COVER MAYO STAND STRL (DRAPES) ×2 IMPLANT
COVER WAND RF STERILE (DRAPES) ×2 IMPLANT
DRSG TEGADERM 4X4.75 (GAUZE/BANDAGES/DRESSINGS) ×2 IMPLANT
DRSG TEGADERM 8X12 (GAUZE/BANDAGES/DRESSINGS) ×2 IMPLANT
GAUZE SPONGE 4X4 12PLY STRL (GAUZE/BANDAGES/DRESSINGS) ×2 IMPLANT
GLOVE BIO SURGEON STRL SZ 6 (GLOVE) IMPLANT
GLOVE BIO SURGEON STRL SZ 6.5 (GLOVE) IMPLANT
GLOVE BIO SURGEON STRL SZ7 (GLOVE) IMPLANT
GLOVE BIO SURGEON STRL SZ7.5 (GLOVE) ×4 IMPLANT
GLOVE BIO SURGEON STRL SZ8 (GLOVE) IMPLANT
GLOVE BIOGEL PI IND STRL 6 (GLOVE) IMPLANT
GLOVE BIOGEL PI IND STRL 6.5 (GLOVE) IMPLANT
GLOVE BIOGEL PI IND STRL 7.0 (GLOVE) IMPLANT
GLOVE BIOGEL PI IND STRL 8 (GLOVE) IMPLANT
GLOVE BIOGEL PI INDICATOR 6 (GLOVE)
GLOVE BIOGEL PI INDICATOR 6.5 (GLOVE)
GLOVE BIOGEL PI INDICATOR 7.0 (GLOVE)
GLOVE BIOGEL PI INDICATOR 8 (GLOVE)
GLOVE ECLIPSE 8.0 STRL XLNG CF (GLOVE) IMPLANT
GOWN STRL REUS W/TWL XL LVL3 (GOWN DISPOSABLE) ×2 IMPLANT
HOLDER FOLEY CATH W/STRAP (MISCELLANEOUS) ×2 IMPLANT
I-SEED AGX 100 ×2 IMPLANT
IMPL SPACEOAR SYSTEM 10ML (Spacer) ×1 IMPLANT
IMPLANT SPACEOAR SYSTEM 10ML (Spacer) ×2 IMPLANT
IV NS 1000ML (IV SOLUTION) ×2
IV NS 1000ML BAXH (IV SOLUTION) ×2 IMPLANT
KIT TURNOVER CYSTO (KITS) ×2 IMPLANT
MARKER SKIN DUAL TIP RULER LAB (MISCELLANEOUS) ×2 IMPLANT
PACK CYSTO (CUSTOM PROCEDURE TRAY) ×2 IMPLANT
SUT BONE WAX W31G (SUTURE) IMPLANT
SYR 10ML LL (SYRINGE) ×2 IMPLANT
UNDERPAD 30X30 (UNDERPADS AND DIAPERS) ×4 IMPLANT
WATER STERILE IRR 3000ML UROMA (IV SOLUTION) IMPLANT
WATER STERILE IRR 500ML POUR (IV SOLUTION) ×2 IMPLANT

## 2018-11-14 NOTE — Discharge Instructions (Signed)
Antibiotics °You may be given a prescription for an antibiotic to take when you arrive home. If so, be sure to take every tablet in the bottle, even if you are feeling better before the prescription is finished. If you begin itching, notice a rash or start to swell on your trunk, arms, legs and/or throat, immediately stop taking the antibiotic and call your Urologist. °Diet °Resume your usual diet when you return home. To keep your bowels moving easily and softly, drink prune, apple and cranberry juice at room temperature. You may also take a stool softener, such as Colace, which is available without prescription at local pharmacies. °Daily activities °? No driving or heavy lifting for at least two days after the implant. °? No bike riding, horseback riding or riding lawn mowers for the first month after the implant. °? Any strenuous physical activity should be approved by your doctor before you resume it. °Sexual relations °You may resume sexual relations two weeks after the procedure. A condom should be used for the first two weeks. Your semen may be dark brown or black; this is normal and is related bleeding that may have occurred during the implant. °Postoperative swelling °Expect swelling and bruising of the scrotum and perineum (the area between the scrotum and anus). Both the swelling and the bruising should resolve in l or 2 weeks. Ice packs and over- the-counter medications such as Tylenol, Advil or Aleve may lessen your discomfort. °Postoperative urination °Most men experience burning on urination and/or urinary frequency. If this becomes bothersome, contact your Urologist.  Medication can be prescribed to relieve these problems.  It is normal to have some blood in your urine for a few days after the implant. °Special instructions related to the seeds °It is unlikely that you will pass an Iodine-125 seed in your urine. The seeds are silver in color and are about as large as a grain of rice. If you pass a  seed, do not handle it with your fingers. Use a spoon to place it in an envelope or jar in place this in base occluded area such as the garage or basement for return to the radiation clinic at your convenience. ° °Contact your doctor for °? Temperature greater than 101 F °? Increasing pain °? Inability to urinate °Follow-up ° You should have follow up with your urologist and radiation oncologist about 3 weeks after the procedure. °General information regarding Iodine seeds °? Iodine-125 is a low energy radioactive material. It is not deeply penetrating and loses energy at short distances. Your prostate will absorb the radiation. Objects that are touched or used by the patient do not become radioactive. °? Body wastes (urine and stool) or body fluids (saliva, tears, semen or blood) are not radioactive. °? The Nuclear Regulatory Commission (NRC) has determined that no radiation precautions are needed for patients undergoing Iodine-125 seed implantation. The NRC states that such patients do not present a risk to the people around them, including young children and pregnant women. However, in keeping with the general principle that radiation exposure should be kept as low reasonably possible, we suggest the following: °? Children and pets should not sit on the patient's lap for the first two (2) weeks after the implant. °? Pregnant (or possibly pregnant) women should avoid prolonged, close contact with the patient for the first two (2) weeks after the implant. °? A distance of three (3) feet is acceptable. °? At a distance of three (3) feet, there is no limit to the   length of time anyone can be with the patient. ° ° ° °Post Anesthesia Home Care Instructions ° °Activity: °Get plenty of rest for the remainder of the day. A responsible individual must stay with you for 24 hours following the procedure.  °For the next 24 hours, DO NOT: °-Drive a car °-Operate machinery °-Drink alcoholic beverages °-Take any medication  unless instructed by your physician °-Make any legal decisions or sign important papers. ° °Meals: °Start with liquid foods such as gelatin or soup. Progress to regular foods as tolerated. Avoid greasy, spicy, heavy foods. If nausea and/or vomiting occur, drink only clear liquids until the nausea and/or vomiting subsides. Call your physician if vomiting continues. ° °Special Instructions/Symptoms: °Your throat may feel dry or sore from the anesthesia or the breathing tube placed in your throat during surgery. If this causes discomfort, gargle with warm salt water. The discomfort should disappear within 24 hours. ° °If you had a scopolamine patch placed behind your ear for the management of post- operative nausea and/or vomiting: ° °1. The medication in the patch is effective for 72 hours, after which it should be removed.  Wrap patch in a tissue and discard in the trash. Wash hands thoroughly with soap and water. °2. You may remove the patch earlier than 72 hours if you experience unpleasant side effects which may include dry mouth, dizziness or visual disturbances. °3. Avoid touching the patch. Wash your hands with soap and water after contact with the patch. °  ° ° ° °

## 2018-11-14 NOTE — Anesthesia Preprocedure Evaluation (Addendum)
Anesthesia Evaluation  Patient identified by MRN, date of birth, ID band Patient awake    Reviewed: Allergy & Precautions, NPO status , Patient's Chart, lab work & pertinent test results  History of Anesthesia Complications Negative for: history of anesthetic complications  Airway Mallampati: II  TM Distance: >3 FB Neck ROM: Full    Dental  (+) Dental Advisory Given, Edentulous Upper, Edentulous Lower   Pulmonary neg pulmonary ROS, former smoker,    Pulmonary exam normal        Cardiovascular hypertension, Pt. on medications + CAD and + Cardiac Stents  Normal cardiovascular exam  Study Conclusions 2013  - Left ventricle: The cavity size was normal. Wall thickness was increased in a pattern of moderate LVH. Systolic function was vigorous. The estimated ejection fraction was in the range of 65% to 70%. Wall motion was normal; there were no regional wall motion abnormalities. - Left atrium: The atrium was moderately dilated. Transthoracic echocardiography.    Neuro/Psych negative neurological ROS  negative psych ROS   GI/Hepatic Neg liver ROS, GERD  ,  Endo/Other  diabetes, Type 2, Oral Hypoglycemic AgentsMorbid obesity  Renal/GU Renal InsufficiencyRenal disease  negative genitourinary   Musculoskeletal negative musculoskeletal ROS (+)   Abdominal   Peds negative pediatric ROS (+)  Hematology negative hematology ROS (+)   Anesthesia Other Findings   Reproductive/Obstetrics negative OB ROS                          Anesthesia Physical Anesthesia Plan  ASA: III  Anesthesia Plan: General   Post-op Pain Management:    Induction: Intravenous  PONV Risk Score and Plan: 3 and Ondansetron, Dexamethasone and Diphenhydramine  Airway Management Planned: LMA and Oral ETT  Additional Equipment:   Intra-op Plan:   Post-operative Plan: Extubation in OR  Informed Consent: I have  reviewed the patients History and Physical, chart, labs and discussed the procedure including the risks, benefits and alternatives for the proposed anesthesia with the patient or authorized representative who has indicated his/her understanding and acceptance.   Dental advisory given  Plan Discussed with: CRNA and Anesthesiologist  Anesthesia Plan Comments:        Anesthesia Quick Evaluation

## 2018-11-14 NOTE — Anesthesia Procedure Notes (Signed)
Procedure Name: LMA Insertion Date/Time: 11/14/2018 11:49 AM Performed by: Wanita Chamberlain, CRNA Pre-anesthesia Checklist: Patient identified, Emergency Drugs available, Suction available, Patient being monitored and Timeout performed Patient Re-evaluated:Patient Re-evaluated prior to induction Oxygen Delivery Method: Circle system utilized Preoxygenation: Pre-oxygenation with 100% oxygen Induction Type: IV induction Ventilation: Mask ventilation without difficulty Laryngoscope Size: Mac and 4 Grade View: Grade I Number of attempts: 1 Airway Equipment and Method: Stylet and Oral airway Placement Confirmation: breath sounds checked- equal and bilateral,  CO2 detector,  positive ETCO2 and ETT inserted through vocal cords under direct vision Secured at: 22 cm Tube secured with: Tape Dental Injury: Teeth and Oropharynx as per pre-operative assessment

## 2018-11-14 NOTE — Progress Notes (Signed)
Dr. Jillyn Hidden anes.doctor was called reguarding patient blood pressure.  Instructed patient to take night time blood pressure medication when he gets home.  [place] for patient to be discharged.   Wheelchair to car driven home by his cousin Cookie.

## 2018-11-14 NOTE — Progress Notes (Deleted)
Wheelchair to car.  Driven by cousin cookie

## 2018-11-14 NOTE — Op Note (Signed)
Preoperative diagnosis: Clinical stage TI C adenocarcinoma the prostate  Postoperative diagnosis: Same  Procedure:  #1 I-125 prostate seed implantation  #2 cystourethroscopy #3 instillation of SpaceOAR biogel  Surgeon: Louis Meckel, M.D. Radiation Oncologist: Tyler Pita, M.D.  Resident Surgeon: Dr. Arminda Resides, MD Anesthesia: Gen.   Indications: Patient  was diagnosed with clinical stage TI C prostate cancer. We had extensive discussion with him about treatment options versus. He elected to proceed with seed implantation. He underwent consultation my office as well as with Dr. Tyler Pita. He appeared to understand the advantages disadvantages potential risks of this treatment option. Full informed consent has been obtained. The patient is had preoperative ciprofloxacin. PAS compression boots were placed.   Technique and findings: Patient was brought the operating room where he had  successful induction of general anesthesia. He was placed in lithotomy position and prepped and draped in usual manner. Appropriate surgical timeout was performed. Radiation oncology department placed a transrectal ultrasound probe anchoring stand. Foley catheter with contrast in the balloon was inserted without difficulty. Anchoring needles were placed within the prostate. Real-time contouring of the urethra prostate and rectum were performed and the dosing parameters were established. Targeted dose was 145 gray. We then came to the operating suite suite for placement of the needles. A second timeout was performed. All needle passage was done with real-time transrectal ultrasound guidance with the sagittal plane. A total of 29 needles were placed. See implantation itself was done with the robotic implanter. 70 active seeds were implanted. The brachytherapy template was then removed.  A site in the midline was selected on the perineum for placement of an 18 g needle with saline.  The needle was advanced above  the rectum and below Denonvillier's fascia to the mid gland and confirmed to be in the midline on transverse imaging.  One cc of saline was injected confirming appropriate expansion of this space.  A total of 5 cc of saline was then injected to open the space further bilaterally.  The saline syringe was then removed and the SpaceOAR hydrogel was injected with good distribution bilaterally.A Foley catheter was removed and flexible cystoscopy failed to show any seeds outside the prostate. The patient was brought to recovery room in stable condition.

## 2018-11-14 NOTE — Interval H&P Note (Signed)
History and Physical Interval Note:  11/14/2018 11:39 AM  Edward Stafford  has presented today for surgery, with the diagnosis of PROSTATE CANCER  The various methods of treatment have been discussed with the patient and family. After consideration of risks, benefits and other options for treatment, the patient has consented to  Procedure(s): RADIOACTIVE SEED IMPLANT/BRACHYTHERAPY IMPLANT (N/A) SPACE OAR INSTILLATION (N/A) as a surgical intervention .  The patient's history has been reviewed, patient examined, no change in status, stable for surgery.  I have reviewed the patient's chart and labs.  Questions were answered to the patient's satisfaction.     Ardis Hughs

## 2018-11-14 NOTE — Transfer of Care (Signed)
Immediate Anesthesia Transfer of Care Note  Patient: Edward Stafford  Procedure(s) Performed: RADIOACTIVE SEED IMPLANT/BRACHYTHERAPY IMPLANT (N/A ) SPACE OAR INSTILLATION (N/A )  Patient Location: PACU  Anesthesia Type:General  Level of Consciousness: awake, alert  and oriented  Airway & Oxygen Therapy: Patient Spontanous Breathing and Patient connected to face mask oxygen  Post-op Assessment: Report given to RN and Post -op Vital signs reviewed and stable  Post vital signs: Reviewed and stable  Last Vitals:  Vitals Value Taken Time  BP 152/95 11/14/2018  1:26 PM  Temp    Pulse 107 11/14/2018  1:27 PM  Resp 20 11/14/2018  1:27 PM  SpO2 95 % 11/14/2018  1:27 PM  Vitals shown include unvalidated device data.  Last Pain:  Vitals:   11/14/18 0953  TempSrc:   PainSc: 0-No pain      Patients Stated Pain Goal: 3 (30/13/14 3888)  Complications: No apparent anesthesia complications

## 2018-11-14 NOTE — H&P (Signed)
Pt presents today for pre-operative history and physical exam in anticipation of brachytherapy seed implant and space oar on 11/14/18 by Dr. Louis Meckel. He was continued on Myrbetriq and started on suppressive nitrofurantoin to down-regulate symptoms from chronic cystitis. He has since completed that medication. His had no exacerbations of burning/painful urination. No interval treatment for UTI. He denies gross hematuria. Denies any recent fevers or infections. He does continue on beta 3 agonist therapy which he feels has improved overactive bladder symptoms and incontinence significantly.   Pt denies F/C, HA, CP, SOB, N/V, diarrhea/constipation, back pain, flank pain, hematuria, and dysuria.   HX: TRUS vol. 34, no appreciable median lobe  IPSS 15, QoL 3  SHIM 1  3 cores 3+4 - apex and Left lateral mid  2 cores 3+3 - left lateral base and right lateral apex   The patient seems very poor insight into his condition.   8/23: The patient smells like urine, and had a large urine stain on his pants. When he left the office, the chair that he was sitting on was saturated.  9/3: The patient presents today with family members, to discuss his prostate cancer and associated treatment options. He is fully recovered from his prostate biopsy, completed his antibiotics, and is back to baseline. Again, he smells like urine. Treated with Myrbetriq 50mg  daily.   *The patient's cystoscopy demonstrated a relatively nonobstructing prostate with no significant median lobe.     ALLERGIES: None    MEDICATIONS: Aspirin  Hydrochlorothiazide 25 mg tablet  Myrbetriq 50 mg tablet, extended release 24 hr 1 tablet PO Daily  Tamsulosin Hcl 0.4 mg capsule 1 capsule PO Daily  Cozaar 25 mg tablet  Pravastatin Sodium 20 mg tablet Oral     GU PSH: Cystoscopy - 08/18/2018 Prostate Needle Biopsy - 07/08/2018      PSH Notes: Cath Stent Placement, Cholecystectomy   NON-GU PSH: Cardiac Stent Placement Cholecystectomy (open) -  2016 Surgical Pathology, Gross And Microscopic Examination For Prostate Needle - 07/08/2018    GU PMH: History of prostate cancer - 07/29/2018 Prostate Cancer - 07/17/2018 Dysuria - 07/14/2018 Urge incontinence - 07/14/2018 ED due to arterial insufficiency - 06/09/2018 Elevated PSA - 06/09/2018, - 2017, Elevated prostate specific antigen (PSA), - 2014 Incomplete bladder emptying - 06/09/2018 Urinary Urgency - 06/09/2018 Chronic Kidney Disease, Chronic renal insufficiency - 2014    NON-GU PMH: Personal history of other diseases of the circulatory system, History of hypertension - 2016 Personal history of other diseases of the nervous system and sense organs, History of glaucoma - 2016 Personal history of other endocrine, nutritional and metabolic disease, History of type 2 diabetes mellitus - 2016, History of hyperlipidemia, - 2016 Encounter for general adult medical examination without abnormal findings, Encounter for preventive health examination GERD    FAMILY HISTORY: Death - Mother Heart Disease - Mother Hypertension - Mother   SOCIAL HISTORY: Marital Status: Single Preferred Language: English; Ethnicity: Not Hispanic Or Latino; Race: Black or African American Current Smoking Status: Patient does not smoke anymore. Has not smoked since 05/26/2013. Smoked for 15 years.   Tobacco Use Assessment Completed: Used Tobacco in last 30 days? Drinks 2 drinks per day. Types of alcohol consumed: Beer, Liquor.  Drinks 3 caffeinated drinks per day. Patient's occupation is/was retired.     Notes: Uses crack cocaine, Disabled, Single, Former smoker, Alcohol use, History of marijuana use, No children   REVIEW OF SYSTEMS:    GU Review Male:   Patient reports frequent urination. Patient denies  hard to postpone urination, burning/ pain with urination, get up at night to urinate, leakage of urine, stream starts and stops, trouble starting your stream, have to strain to urinate , erection problems, and  penile pain.  Gastrointestinal (Upper):   Patient denies nausea, vomiting, and indigestion/ heartburn.  Gastrointestinal (Lower):   Patient denies diarrhea and constipation.  Constitutional:   Patient denies fever, night sweats, weight loss, and fatigue.  Skin:   Patient denies skin rash/ lesion and itching.  Eyes:   Patient denies blurred vision and double vision.  Ears/ Nose/ Throat:   Patient reports sore throat. Patient denies sinus problems.  Hematologic/Lymphatic:   Patient denies easy bruising and swollen glands.  Cardiovascular:   Patient denies leg swelling and chest pains.  Respiratory:   Patient denies cough and shortness of breath.  Endocrine:   Patient denies excessive thirst.  Musculoskeletal:   Patient denies back pain and joint pain.  Neurological:   Patient denies headaches and dizziness.  Psychologic:   Patient denies depression and anxiety.   VITAL SIGNS:      11/05/2018 08:58 AM  Weight 266 lb / 120.66 kg  Height 72 in / 182.88 cm  BP 149/85 mmHg  Pulse 54 /min  Temperature 97.6 F / 36.4 C  BMI 36.1 kg/m   MULTI-SYSTEM PHYSICAL EXAMINATION:    Constitutional: Well-nourished. No physical deformities. Normally developed. Good grooming. He smells very strongly of urine.  Neck: Neck symmetrical, not swollen. Normal tracheal position.  Respiratory: No labored breathing, no use of accessory muscles.   Cardiovascular: Normal temperature, normal extremity pulses, no swelling, no varicosities.  Skin: No paleness, no jaundice, no cyanosis. No lesion, no ulcer, no rash.  Neurologic / Psychiatric: Oriented to time, oriented to place, oriented to person. No depression, no anxiety, no agitation.  Gastrointestinal: Obese abdomen. No mass, no tenderness, no rigidity.   Musculoskeletal: Normal gait and station of head and neck.     PAST DATA REVIEWED:  Source Of History:  Patient  Lab Test Review:   PSA  Records Review:   Pathology Reports, Previous Patient Records  Urine  Test Review:   Urinalysis   06/09/18  PSA  Total PSA 15.40 ng/mL    11/05/18  Urinalysis  Urine Appearance Clear   Urine Color Yellow   Urine Glucose Neg mg/dL  Urine Bilirubin Neg mg/dL  Urine Ketones Neg mg/dL  Urine Specific Gravity 1.015   Urine Blood Neg ery/uL  Urine pH 5.5   Urine Protein Neg mg/dL  Urine Urobilinogen 0.2 mg/dL  Urine Nitrites Neg   Urine Leukocyte Esterase Neg leu/uL   PROCEDURES:          Urinalysis - 81003 Dipstick Dipstick Cont'd  Color: Yellow Bilirubin: Neg  Appearance: Clear Ketones: Neg  Specific Gravity: 1.015 Blood: Neg  pH: 5.5 Protein: Neg  Glucose: Neg Urobilinogen: 0.2    Nitrites: Neg    Leukocyte Esterase: Neg    Notes:      ASSESSMENT:      ICD-10 Details  1 GU:   Prostate Cancer - C61   2   Urge incontinence - N39.41 Improving   PLAN:           Orders Labs Urine Culture          Schedule Return Visit/Planned Activity: Keep Scheduled Appointment - Schedule Surgery          Document Letter(s):  Created for Patient: Clinical Summary         Notes:  There are no changes in the patients history or physical exam since last evaluation by Dr. Louis Meckel. Pt is scheduled to undergo brachytherapy and space oar placement on 11/14/18.  All pt's questions were answered to the best of my ability.   Pre-op urine c/s sent.

## 2018-11-14 NOTE — Anesthesia Postprocedure Evaluation (Signed)
Anesthesia Post Note  Patient: Edward Stafford  Procedure(s) Performed: RADIOACTIVE SEED IMPLANT/BRACHYTHERAPY IMPLANT (N/A ) SPACE OAR INSTILLATION (N/A )     Patient location during evaluation: PACU Anesthesia Type: General Level of consciousness: sedated Pain management: pain level controlled Vital Signs Assessment: post-procedure vital signs reviewed and stable Respiratory status: spontaneous breathing and respiratory function stable Cardiovascular status: stable Postop Assessment: no apparent nausea or vomiting Anesthetic complications: no    Last Vitals:  Vitals:   11/14/18 1400 11/14/18 1402  BP:  (!) 170/83  Pulse: 75 73  Resp: (!) 7 17  Temp:    SpO2: 95% 96%    Last Pain:  Vitals:   11/14/18 1336  TempSrc:   PainSc: 0-No pain                 Edward Stafford

## 2018-11-21 ENCOUNTER — Encounter (HOSPITAL_BASED_OUTPATIENT_CLINIC_OR_DEPARTMENT_OTHER): Payer: Self-pay | Admitting: Urology

## 2018-11-23 NOTE — Progress Notes (Signed)
  Radiation Oncology         [phone] ________________________________  Name: Edward Stafford MRN: [id number]  Date: 11/23/2018  DOB: 01/28/1951       Prostate Seed Implant  CC:Paz, Alda Berthold, MD  No ref. provider found  DIAGNOSIS: 67 y.o. gentleman with Stage T1c adenocarcinoma of the prostate with Gleason Score of 3+4, and PSA of 15.4.     ICD-10-CM   1. Prostate cancer [facility] [place] Discharge patient    PROCEDURE: Insertion of radioactive I-125 seeds into the prostate gland.  RADIATION DOSE: 145 Gy, definitive therapy.  TECHNIQUE: Edward Stafford was brought to the operating room with the urologist. He was placed in the dorsolithotomy position. He was catheterized and a rectal tube was inserted. The perineum was shaved, prepped and draped. The ultrasound probe was then introduced into the rectum to see the prostate gland.  TREATMENT DEVICE: A needle grid was attached to the ultrasound probe stand and anchor needles were placed.  3D PLANNING: The prostate was imaged in 3D using a sagittal sweep of the prostate probe. These images were transferred to the planning computer. There, the prostate, urethra and rectum were defined on each axial reconstructed image. Then, the software created an optimized 3D plan and a few seed positions were adjusted. The quality of the plan was reviewed using [facility] information for the target and the following two organs at risk:  Urethra and Rectum.  Then the accepted plan was printed and handed off to the radiation therapist.  Under my supervision, the custom loading of the seeds and spacers was carried out and loaded into sealed vicryl sleeves.  These pre-loaded needles were then placed into the needle holder.Marland Kitchen  PROSTATE VOLUME STUDY:  Using transrectal ultrasound the volume of the prostate was verified to be 39.7 cc.  SPECIAL TREATMENT PROCEDURE/SUPERVISION AND HANDLING: The pre-loaded needles were then delivered under sagittal guidance. A total of 29  needles were used to deposit 70 seeds in the prostate gland. The individual seed activity was 0.446 mCi.  SpaceOAR:  Yes  COMPLEX SIMULATION: At the end of the procedure, an anterior radiograph of the pelvis was obtained to document seed positioning and count. Cystoscopy was performed to check the urethra and bladder.  MICRODOSIMETRY: At the end of the procedure, the patient was emitting 0.061 mR/hr at 1 meter. Accordingly, he was considered safe for hospital discharge.  PLAN: The patient will return to the radiation oncology clinic for post implant CT dosimetry in three weeks.   ________________________________  Sheral Apley Tammi Klippel, M.D.

## 2018-11-28 ENCOUNTER — Ambulatory Visit (HOSPITAL_COMMUNITY): Payer: Medicare Other

## 2018-12-03 ENCOUNTER — Telehealth: Payer: Self-pay | Admitting: *Deleted

## 2018-12-03 NOTE — Telephone Encounter (Signed)
CALLED PATIENT TO REMIND OF POST SEED APPTS. AND MRI FOR 12-04-18, NO ANSWER, UNABLE TO LEAVE MESSAGE

## 2018-12-04 ENCOUNTER — Encounter: Payer: Self-pay | Admitting: Urology

## 2018-12-04 ENCOUNTER — Ambulatory Visit
Admission: RE | Admit: 2018-12-04 | Discharge: 2018-12-04 | Disposition: A | Discharge status: Home / Self Care | Payer: Medicare Other | Source: Ambulatory Visit | Attending: Urology | Admitting: Urology

## 2018-12-04 ENCOUNTER — Other Ambulatory Visit: Payer: Self-pay

## 2018-12-04 ENCOUNTER — Ambulatory Visit (HOSPITAL_COMMUNITY)
Admission: RE | Admit: 2018-12-04 | Discharge: 2018-12-04 | Disposition: A | Discharge status: Home / Self Care | Payer: Medicare Other | Source: Ambulatory Visit | Attending: Urology | Admitting: Urology

## 2018-12-04 ENCOUNTER — Ambulatory Visit
Admission: RE | Admit: 2018-12-04 | Discharge: 2018-12-04 | Disposition: A | Discharge status: Home / Self Care | Payer: Medicare Other | Source: Ambulatory Visit | Attending: Radiation Oncology | Admitting: Radiation Oncology

## 2018-12-04 ENCOUNTER — Encounter: Payer: Self-pay | Admitting: Medical Oncology

## 2018-12-04 ENCOUNTER — Ambulatory Visit: Payer: Medicare Other | Admitting: Radiation Oncology

## 2018-12-04 VITALS — BP 131/88 | HR 57 | Temp 98.0°F | Resp 18 | Wt 276.9 lb

## 2018-12-04 DIAGNOSIS — R350 Frequency of micturition: Secondary | ICD-10-CM | POA: Insufficient documentation

## 2018-12-04 DIAGNOSIS — Z79899 Other long term (current) drug therapy: Secondary | ICD-10-CM | POA: Insufficient documentation

## 2018-12-04 DIAGNOSIS — C61 Malignant neoplasm of prostate: Secondary | ICD-10-CM | POA: Diagnosis not present

## 2018-12-04 DIAGNOSIS — R3911 Hesitancy of micturition: Secondary | ICD-10-CM | POA: Insufficient documentation

## 2018-12-04 DIAGNOSIS — Z7982 Long term (current) use of aspirin: Secondary | ICD-10-CM | POA: Insufficient documentation

## 2018-12-04 DIAGNOSIS — Z923 Personal history of irradiation: Secondary | ICD-10-CM | POA: Diagnosis not present

## 2018-12-04 NOTE — Progress Notes (Signed)
Weight and vitals stable. Denies pain. Pre seed IPSS 18. Post seed IPSS 19. Denies dysuria or hematuria. Denies urinary leakage or incontinence but, patient smells of urine and his pant are visibly wet with urine. Scheduled to follow up with urology tomorrow. Scheduled for MRI today at 1000.

## 2018-12-04 NOTE — Progress Notes (Signed)
Radiation Oncology         [phone] ________________________________  Name: Edward Stafford MRN: [id number]  Date: 12/04/2018  DOB: 1951-04-10  Post-Seed Follow-Up Visit Note  CC: Colon Branch, MD  Ardis Hughs, MD  Diagnosis:   68 y.o. gentleman with Stage T1c adenocarcinoma of the prostate with Gleason Score of 3+4, and PSA of 15.4.    ICD-10-CM   1. Prostate cancer ([place]) C61     Interval Since Last Radiation:  3 weeks 11/14/18: Insertion of radioactive I-125 seeds into the prostate gland; 145 Gy, definitive therapy with SpaceOAR gel placement.  Narrative:  The patient returns today for routine follow-up.  He is complaining of increased urinary frequency and urinary hesitation symptoms. He filled out a questionnaire regarding urinary function today providing and overall IPSS score of 19 characterizing his symptoms as moderate with increased frequency, urgency and intermittency.  He specifically denies dysuria, gross hematuria, incomplete emptying or incontinence.  However, his pants are visibly wet with urine and he smells strongly of urine.  When questioned, he reports that this is normal for him an unchanged since the time of his procedure.  His pre-implant score was 18. He denies any bowel symptoms.  Overall, he is quite pleased with his progress to date.  ALLERGIES:  has No Known Allergies.  Meds: Current Outpatient Medications  Medication Sig Dispense Refill  . aspirin EC 81 MG tablet Take 81 mg by mouth daily.    Marland Kitchen doxycycline (DORYX) 100 MG EC tablet Take 100 mg by mouth every evening.    . hydrochlorothiazide (HYDRODIURIL) 25 MG tablet Take 1 tablet (25 mg total) by mouth daily. (Patient taking differently: Take 25 mg by mouth every evening. ) 90 tablet 1  . losartan (COZAAR) 25 MG tablet Take 1 tablet (25 mg total) by mouth daily. (Patient taking differently: Take 25 mg by mouth every evening. ) 90 tablet 1  . mirabegron ER (MYRBETRIQ) 50 MG TB24 tablet Take 50  mg by mouth every evening.     . phenazopyridine (PYRIDIUM) 200 MG tablet Take 1 tablet (200 mg total) by mouth 3 (three) times daily as needed for pain. 20 tablet 0  . pravastatin (PRAVACHOL) 20 MG tablet Take 1 tablet (20 mg total) by mouth daily. (Patient taking differently: Take 20 mg by mouth every evening. ) 90 tablet 1  . tamsulosin (FLOMAX) 0.4 MG CAPS capsule Take 1 capsule (0.4 mg total) by mouth daily. 30 capsule 11  . traMADol (ULTRAM) 50 MG tablet Take 1-2 tablets (50-100 mg total) by mouth every 6 (six) hours as needed for moderate pain. 15 tablet 0   No current facility-administered medications for this encounter.     Physical Findings: In general this is a well appearing African-American male in no acute distress.  He's alert and oriented x4 and appropriate throughout the examination. Cardiopulmonary assessment is negative for acute distress and he exhibits normal effort.   Lab Findings: Lab Results  Component Value Date   WBC 5.5 11/07/2018   HGB 15.0 11/07/2018   HCT 46.1 11/07/2018   MCV 87.0 11/07/2018   PLT 289 11/07/2018    Radiographic Findings:  Patient underwent CT imaging in our clinic for post implant dosimetry. The CT was reviewed by Dr. Tammi Klippel and appears to demonstrate an adequate distribution of radioactive seeds throughout the prostate gland. There are no seeds in or near the rectum.  He is scheduled for an MRI of the prostate at 10 AM today  and these images will be fused with his CT images for further evaluation.  We suspect the final radiation plan and dosimetry will show appropriate coverage of the prostate gland.   Impression/Plan: 68 y.o. gentleman with Stage T1c adenocarcinoma of the prostate with Gleason Score of 3+4, and PSA of 15.4. The patient is recovering from the effects of radiation. His urinary symptoms should gradually improve over the next 4-6 months. We talked about this today. He is encouraged by his improvement already and is otherwise  pleased with his outcome. We also talked about long-term follow-up for prostate cancer following seed implant. He understands that ongoing PSA determinations and digital rectal exams will help perform surveillance to rule out disease recurrence. He has a follow up appointment scheduled with Jiles Crocker, NP on Friday, December 05, 2018. He understands what to expect with his PSA measures. Patient was also educated today about some of the long-term effects from radiation including a small risk for rectal bleeding and possibly erectile dysfunction. We talked about some of the general management approaches to these potential complications. However, I did encourage the patient to contact our office or return at any point if he has questions or concerns related to his previous radiation and prostate cancer.    Nicholos Johns, PA-C

## 2018-12-05 DIAGNOSIS — N3020 Other chronic cystitis without hematuria: Secondary | ICD-10-CM | POA: Diagnosis not present

## 2018-12-07 NOTE — Progress Notes (Signed)
  Radiation Oncology         [phone]) [phone] ________________________________  Name: Edward Stafford MRN: [id number]  Date: 12/04/2018  DOB: 1951/08/29  COMPLEX SIMULATION NOTE  NARRATIVE:  The patient was brought to the [facility] suite today following prostate seed implantation approximately one month ago.  Identity was confirmed.  All relevant records and images related to the planned course of therapy were reviewed.  Then, the patient was set-up supine.  CT images were obtained.  The CT images were loaded into the planning software.  Then the prostate and rectum were contoured.  Treatment planning then occurred.  The implanted iodine 125 seeds were identified by the physics staff for projection of radiation distribution  I have requested : 3D Simulation  I have requested a DVH of the following structures: Prostate and rectum.    ________________________________  Sheral Apley Tammi Klippel, M.D.

## 2019-01-07 ENCOUNTER — Encounter: Payer: Self-pay | Admitting: Radiation Oncology

## 2019-01-07 ENCOUNTER — Ambulatory Visit: Payer: Medicare Other | Attending: Radiation Oncology | Admitting: Radiation Oncology

## 2019-01-07 DIAGNOSIS — C61 Malignant neoplasm of prostate: Secondary | ICD-10-CM | POA: Insufficient documentation

## 2019-01-19 NOTE — Progress Notes (Addendum)
Subjective:   Edward Stafford is a 68 y.o. male who presents for Medicare Annual/Subsequent preventive examination.  Pt enjoys playing golf when he gets a chance.   Review of Systems: No ROS.  Medicare Wellness Visit. Additional risk factors are reflected in the social history. Cardiac Risk Factors include: advanced age (>52men, >59 women);diabetes mellitus;hypertension;male gender;obesity (BMI >30kg/m2);sedentary lifestyle   Sleep patterns:   No issues per pt. Home Safety/Smoke Alarms: Feels safe in home. Smoke alarms in place.  Lives alone in first floor apt.  Male:   CCS-  Last 10/16/10- 10 yr recall   PSA-  Lab Results  Component Value Date   PSA 15.32 (H) 11/11/2017   PSA 11.16 (H) 10/02/2016   PSA 8.66 (H) 08/30/2015   Eye- pt states he will schedule.    Objective:    Vitals: BP 140/80 (BP Location: Left Arm, Patient Position: Sitting, Cuff Size: Normal)   Pulse 70   Ht 6' (1.829 m)   Wt 278 lb 6.4 oz (126.3 kg)   SpO2 97%   BMI 37.76 kg/m   Body mass index is 37.76 kg/m.  Advanced Directives 01/20/2019 11/14/2018 04/16/2012  Does Patient Have a Medical Advance Directive? No No Patient does not have advance directive;Patient would not like information  Would patient like information on creating a medical advance directive? No - Patient declined - -    Tobacco Social History   Tobacco Use  Smoking Status Former Smoker  . Packs/day: 0.50  . Years: 15.00  . Pack years: 7.50  . Types: Cigarettes  . Last attempt to quit: 06/15/2013  . Years since quitting: 5.6  Smokeless Tobacco Never Used  Tobacco Comment   never heavy smoker      Counseling given: Not Answered Comment: never heavy smoker    Clinical Intake: Pain : No/denies pain    Past Medical History:  Diagnosis Date  . Back pain, chronic    disabled due to chronic back pain  . Chest pain    Cardiac cath (-)2008, Myoview (-) 2011  . Chronic kidney disease   . DIABETES MELLITUS, TYPE  II, CONTROLLED 06/08/2008   diet controlled, pt does not check cbg   . Drug abuse-- crack, marijuana 04/19/2007   hx of  . GERD (gastroesophageal reflux disease)    no current meds  . Glaucoma   . Hyperlipidemia   . Hypertension   . Prostate cancer ([place]) 06/2018  . SINUS BRADYCARDIA 05/17/2010  . UTI (urinary tract infection)    on doxycycline for   Past Surgical History:  Procedure Laterality Date  . CHOLECYSTECTOMY    . CORONARY ANGIOPLASTY WITH STENT PLACEMENT    . PROSTATE BIOPSY    . RADIOACTIVE SEED IMPLANT N/A 11/14/2018   Procedure: RADIOACTIVE SEED IMPLANT/BRACHYTHERAPY IMPLANT;  Surgeon: Ardis Hughs, MD;  Location: [facility];  Service: Urology;  Laterality: N/A;  . SPACE OAR INSTILLATION N/A 11/14/2018   Procedure: SPACE OAR INSTILLATION;  Surgeon: Ardis Hughs, MD;  Location: [facility];  Service: Urology;  Laterality: N/A;   Family History  Problem Relation Age of Onset  . CAD Mother        age onset ?  . Colon cancer Other        cousin  . Stroke Neg Hx   . Prostate cancer Neg Hx   . Diabetes Neg Hx   . Breast cancer Neg Hx   . Pancreatic cancer Neg Hx  Social History   Socioeconomic History  . Marital status: Single    Spouse name: Not on file  . Number of children: 0  . Years of education: Not on file  . Highest education level: Not on file  Occupational History  . Occupation: Disabled.   Social Needs  . Financial resource strain: Not on file  . Food insecurity:    Worry: Not on file    Inability: Not on file  . Transportation needs:    Medical: Not on file    Non-medical: Not on file  Tobacco Use  . Smoking status: Former Smoker    Packs/day: 0.50    Years: 15.00    Pack years: 7.50    Types: Cigarettes    Last attempt to quit: 06/15/2013    Years since quitting: 5.6  . Smokeless tobacco: Never Used  . Tobacco comment: never heavy smoker   Substance and Sexual Activity  . Alcohol use: No     Frequency: Never    Comment: none  . Drug use: Not Currently    Comment: denies since ~ 2014, h/o marijuana-crack  . Sexual activity: Not Currently  Lifestyle  . Physical activity:    Days per week: Not on file    Minutes per session: Not on file  . Stress: Not on file  Relationships  . Social connections:    Talks on phone: Not on file    Gets together: Not on file    Attends religious service: Not on file    Active member of club or organization: Not on file    Attends meetings of clubs or organizations: Not on file    Relationship status: Not on file  Other Topics Concern  . Not on file  Social History Narrative   Lives by himself, has a car.     Outpatient Encounter Medications as of 01/20/2019  Medication Sig  . aspirin EC 81 MG tablet Take 81 mg by mouth daily.  . hydrochlorothiazide (HYDRODIURIL) 25 MG tablet Take 1 tablet (25 mg total) by mouth daily. (Patient taking differently: Take 25 mg by mouth every evening. )  . losartan (COZAAR) 25 MG tablet Take 1 tablet (25 mg total) by mouth daily. (Patient taking differently: Take 25 mg by mouth every evening. )  . mirabegron ER (MYRBETRIQ) 50 MG TB24 tablet Take 50 mg by mouth every evening.   . pravastatin (PRAVACHOL) 20 MG tablet Take 1 tablet (20 mg total) by mouth daily. (Patient taking differently: Take 20 mg by mouth every evening. )  . traMADol (ULTRAM) 50 MG tablet Take 1-2 tablets (50-100 mg total) by mouth every 6 (six) hours as needed for moderate pain.  . phenazopyridine (PYRIDIUM) 200 MG tablet Take 1 tablet (200 mg total) by mouth 3 (three) times daily as needed for pain. (Patient not taking: Reported on 01/20/2019)  . tamsulosin (FLOMAX) 0.4 MG CAPS capsule Take 1 capsule (0.4 mg total) by mouth daily. (Patient not taking: Reported on 01/20/2019)  . [DISCONTINUED] doxycycline (DORYX) 100 MG EC tablet Take 100 mg by mouth every evening.   No facility-administered encounter medications on file as of 01/20/2019.      Activities of Daily Living In your present state of health, do you have any difficulty performing the following activities: 01/20/2019 11/14/2018  Hearing? N -  Vision? N -  Difficulty concentrating or making decisions? N -  Walking or climbing stairs? N -  Comment uses cane as needed -  Dressing or bathing?  N -  Doing errands, shopping? N N  Preparing Food and eating ? N -  Using the Toilet? N -  In the past six months, have you accidently leaked urine? N -  Do you have problems with loss of bowel control? N -  Managing your Medications? N -  Managing your Finances? N -  Housekeeping or managing your Housekeeping? N -  Some recent data might be hidden    Patient Care Team: Colon Branch, MD as PCP - General (Internal Medicine) Ardis Hughs, MD as Attending Physician (Urology)   Assessment:   This is a routine wellness examination for Jamarl. Physical assessment deferred to PCP.  Exercise Activities and Dietary recommendations Current Exercise Habits: The patient does not participate in regular exercise at present, Exercise limited by: None identified   Diet (meal preparation, eat out, water intake, caffeinated beverages, dairy products, fruits and vegetables): in general, an "unhealthy" diet, on average, 2 meals per day      Goals    . DIET - EAT MORE FRUITS AND VEGETABLES    . DIET - INCREASE WATER INTAKE    . Increase physical activity       Fall Risk Fall Risk  01/20/2019 11/11/2017 09/10/2016 08/30/2015  Falls in the past year? 0 No No No   Depression Screen PHQ 2/9 Scores 01/20/2019 09/10/2018 11/11/2017 09/10/2016  PHQ - 2 Score 0 0 0 0    Cognitive Function MMSE - Mini Mental State Exam 01/20/2019  Orientation to time 5  Orientation to Place 5  Registration 3  Attention/ Calculation 5  Recall 3  Language- name 2 objects 2  Language- repeat 1  Language- follow 3 step command 3  Language- read & follow direction 1  Write a sentence 1  Copy  design 1  Total score 30        Immunization History  Administered Date(s) Administered  . Influenza Whole 08/31/2009, 09/15/2010  . Influenza, High Dose Seasonal PF 08/30/2015, 11/11/2017, 10/22/2018  . Influenza,inj,Quad PF,6+ Mos 10/01/2014, 09/10/2016  . Pneumococcal Conjugate-13 06/06/2016  . Pneumococcal Polysaccharide-23 09/15/2010  . Td 01/27/2007, 11/11/2017   Screening Tests Health Maintenance  Topic Date Due  . OPHTHALMOLOGY EXAM  12/30/1961  . FOOT EXAM  02/01/2016  . PNA vac Low Risk Adult (2 of 2 - PPSV23) 05/27/2019 (Originally 06/06/2017)  . HEMOGLOBIN A1C  05/13/2019  . COLONOSCOPY  11/27/2019  . TETANUS/TDAP  11/12/2027  . INFLUENZA VACCINE  Completed  . Hepatitis C Screening  Completed       Plan:     Please schedule your next medicare wellness visit with me in 1 yr.  Eat heart healthy diet (full of fruits, vegetables, whole grains, lean protein, water--limit salt, fat, and sugar intake) and increase physical activity as tolerated.  Please schedule your yearly eye exam and have them send [place] report.  I have personally reviewed and noted the following in the patient's chart:   . Medical and social history . Use of alcohol, tobacco or illicit drugs  . Current medications and supplements . Functional ability and status . Nutritional status . Physical activity . Advanced directives . List of other physicians . Hospitalizations, surgeries, and ER visits in previous 12 months . Vitals . Screenings to include cognitive, depression, and falls . Referrals and appointments  In addition, I have reviewed and discussed with patient certain preventive protocols, quality metrics, and best practice recommendations. A written personalized care plan for preventive services as well as  general preventive health recommendations were provided to patient.     Naaman Plummer [place], [place]  01/20/2019  Kathlene November, MD

## 2019-01-20 ENCOUNTER — Encounter: Payer: Self-pay | Admitting: *Deleted

## 2019-01-20 ENCOUNTER — Ambulatory Visit (INDEPENDENT_AMBULATORY_CARE_PROVIDER_SITE_OTHER): Payer: Medicare Other | Admitting: *Deleted

## 2019-01-20 VITALS — BP 140/80 | HR 70 | Ht 72.0 in | Wt 278.4 lb

## 2019-01-20 DIAGNOSIS — Z0000 Encounter for general adult medical examination without abnormal findings: Secondary | ICD-10-CM

## 2019-01-20 NOTE — Patient Instructions (Signed)
Please schedule your next medicare wellness visit with me in 1 yr.  Eat heart healthy diet (full of fruits, vegetables, whole grains, lean protein, water--limit salt, fat, and sugar intake) and increase physical activity as tolerated.  Please schedule your yearly eye exam and have them send [place] report.   [name] , Thank you for taking time to come for your Medicare Wellness Visit. I appreciate your ongoing commitment to your health goals. Please review the following plan we discussed and let me know if I can assist you in the future.   These are the goals we discussed: Goals    . DIET - EAT MORE FRUITS AND VEGETABLES    . DIET - INCREASE WATER INTAKE    . Increase physical activity       This is a list of the screening recommended for you and due dates:  Health Maintenance  Topic Date Due  . Eye exam for diabetics  12/30/1961  . Complete foot exam   02/01/2016  . Pneumonia vaccines (2 of 2 - PPSV23) 05/27/2019*  . Hemoglobin A1C  05/13/2019  . Colon Cancer Screening  11/27/2019  . Tetanus Vaccine  11/12/2027  . Flu Shot  Completed  .  Hepatitis C: One time screening is recommended by Center for Disease Control  (CDC) for  adults born from [birth year] through 1965.   Completed  *Topic was postponed. The date shown is not the original due date.    Health Maintenance After Age 80 After age 21, you are at a higher risk for certain long-term diseases and infections as well as injuries from falls. Falls are a major cause of broken bones and head injuries in people who are older than age 67. Getting regular preventive care can help to keep you healthy and well. Preventive care includes getting regular testing and making lifestyle changes as recommended by your health care provider. Talk with your health care provider about:  Which screenings and tests you should have. A screening is a test that checks for a disease when you have no symptoms.  A diet and exercise plan that is right for  you. What should I know about screenings and tests to prevent falls? Screening and testing are the best ways to find a health problem early. Early diagnosis and treatment give you the best chance of managing medical conditions that are common after age 67. Certain conditions and lifestyle choices may make you more likely to have a fall. Your health care provider may recommend:  Regular vision checks. Poor vision and conditions such as cataracts can make you more likely to have a fall. If you wear glasses, make sure to get your prescription updated if your vision changes.  Medicine review. Work with your health care provider to regularly review all of the medicines you are taking, including over-the-counter medicines. Ask your health care provider about any side effects that may make you more likely to have a fall. Tell your health care provider if any medicines that you take make you feel dizzy or sleepy.  Osteoporosis screening. Osteoporosis is a condition that causes the bones to get weaker. This can make the bones weak and cause them to break more easily.  Blood pressure screening. Blood pressure changes and medicines to control blood pressure can make you feel dizzy.  Strength and balance checks. Your health care provider may recommend certain tests to check your strength and balance while standing, walking, or changing positions.  Foot health exam. Foot pain  and numbness, as well as not wearing proper footwear, can make you more likely to have a fall.  Depression screening. You may be more likely to have a fall if you have a fear of falling, feel emotionally low, or feel unable to do activities that you used to do.  Alcohol use screening. Using too much alcohol can affect your balance and may make you more likely to have a fall. What actions can I take to lower my risk of falls? General instructions  Talk with your health care provider about your risks for falling. Tell your health care  provider if: ? You fall. Be sure to tell your health care provider about all falls, even ones that seem minor. ? You feel dizzy, sleepy, or off-balance.  Take over-the-counter and prescription medicines only as told by your health care provider. These include any supplements.  Eat a healthy diet and maintain a healthy weight. A healthy diet includes low-fat dairy products, low-fat (lean) meats, and fiber from whole grains, beans, and lots of fruits and vegetables. Home safety  Remove any tripping hazards, such as rugs, cords, and clutter.  Install safety equipment such as grab bars in bathrooms and safety rails on stairs.  Keep rooms and walkways well-lit. Activity   Follow a regular exercise program to stay fit. This will help you maintain your balance. Ask your health care provider what types of exercise are appropriate for you.  If you need a cane or walker, use it as recommended by your health care provider.  Wear supportive shoes that have nonskid soles. Lifestyle  Do not drink alcohol if your health care provider tells you not to drink.  If you drink alcohol, limit how much you have: ? 0-1 drink a day for women. ? 0-2 drinks a day for men.  Be aware of how much alcohol is in your drink. In the [place] one drink equals one typical bottle of beer (12 oz), one-half glass of wine (5 oz), or one shot of hard liquor (1 oz).  Do not use any products that contain nicotine or tobacco, such as cigarettes and e-cigarettes. If you need help quitting, ask your health care provider. Summary  Having a healthy lifestyle and getting preventive care can help to protect your health and wellness after age 52.  Screening and testing are the best way to find a health problem early and help you avoid having a fall. Early diagnosis and treatment give you the best chance for managing medical conditions that are more common for people who are older than age 67.  Falls are a major cause of broken  bones and head injuries in people who are older than age 67. Take precautions to prevent a fall at home.  Work with your health care provider to learn what changes you can make to improve your health and wellness and to prevent falls. This information is not intended to replace advice given to you by your health care provider. Make sure you discuss any questions you have with your health care provider. Document Released: 09/25/2017 Document Revised: 09/25/2017 Document Reviewed: 09/25/2017 Elsevier Interactive Patient Education  2019 [employer].

## 2019-01-20 NOTE — Progress Notes (Signed)
  Radiation Oncology         (336) [phone] ________________________________  Name: JERMIE HIPPE MRN: [id number]  Date: 01/07/2019  DOB: Dec 10, 1950  3D Planning Note   Prostate Brachytherapy Post-Implant Dosimetry  Diagnosis: 68 y.o. gentleman with Stage T1c adenocarcinoma of the prostate with Gleason Score of 3+4, and PSA of 15.4  Narrative: On a previous date, Shiloh L Achor returned following prostate seed implantation for post implant planning. He underwent CT scan complex simulation to delineate the three-dimensional structures of the pelvis and demonstrate the radiation distribution.  Since that time, the seed localization, and complex isodose planning with dose volume histograms have now been completed.  Results:   Prostate Coverage - The dose of radiation delivered to the 90% or more of the prostate gland (D90) was 101.61% of the prescription dose. This exceeds our goal of greater than 90%. Rectal Sparing - The volume of rectal tissue receiving the prescription dose or higher was 0.0 cc. This falls under our thresholds tolerance of 1.0 cc.  Impression: The prostate seed implant appears to show adequate target coverage and appropriate rectal sparing.  Plan:  The patient will continue to follow with urology for ongoing PSA determinations. I would anticipate a high likelihood for local tumor control with minimal risk for rectal morbidity.  ________________________________  Sheral Apley Tammi Klippel, M.D.

## 2019-03-26 ENCOUNTER — Other Ambulatory Visit: Payer: Self-pay | Admitting: Internal Medicine

## 2019-04-09 ENCOUNTER — Other Ambulatory Visit: Payer: Self-pay | Admitting: Internal Medicine

## 2019-05-10 ENCOUNTER — Other Ambulatory Visit: Payer: Self-pay | Admitting: Internal Medicine

## 2019-06-24 ENCOUNTER — Encounter: Payer: Self-pay | Admitting: Internal Medicine

## 2019-07-05 ENCOUNTER — Other Ambulatory Visit: Payer: Self-pay | Admitting: Internal Medicine

## 2019-07-28 ENCOUNTER — Other Ambulatory Visit: Payer: Self-pay | Admitting: Internal Medicine

## 2019-08-26 ENCOUNTER — Other Ambulatory Visit: Payer: Self-pay | Admitting: Internal Medicine

## 2019-09-02 ENCOUNTER — Other Ambulatory Visit: Payer: Self-pay

## 2019-09-02 ENCOUNTER — Other Ambulatory Visit: Payer: Self-pay | Admitting: Internal Medicine

## 2019-09-03 ENCOUNTER — Ambulatory Visit (INDEPENDENT_AMBULATORY_CARE_PROVIDER_SITE_OTHER): Payer: Medicare Other | Admitting: Internal Medicine

## 2019-09-03 ENCOUNTER — Encounter: Payer: Self-pay | Admitting: Internal Medicine

## 2019-09-03 VITALS — BP 144/75 | HR 53 | Temp 98.2°F | Resp 16 | Ht 72.0 in | Wt 285.4 lb

## 2019-09-03 DIAGNOSIS — E782 Mixed hyperlipidemia: Secondary | ICD-10-CM

## 2019-09-03 DIAGNOSIS — I10 Essential (primary) hypertension: Secondary | ICD-10-CM

## 2019-09-03 DIAGNOSIS — E1169 Type 2 diabetes mellitus with other specified complication: Secondary | ICD-10-CM | POA: Diagnosis not present

## 2019-09-03 DIAGNOSIS — L603 Nail dystrophy: Secondary | ICD-10-CM | POA: Diagnosis not present

## 2019-09-03 DIAGNOSIS — R269 Unspecified abnormalities of gait and mobility: Secondary | ICD-10-CM

## 2019-09-03 DIAGNOSIS — R29898 Other symptoms and signs involving the musculoskeletal system: Secondary | ICD-10-CM

## 2019-09-03 DIAGNOSIS — Z0000 Encounter for general adult medical examination without abnormal findings: Secondary | ICD-10-CM | POA: Diagnosis not present

## 2019-09-03 DIAGNOSIS — Z23 Encounter for immunization: Secondary | ICD-10-CM

## 2019-09-03 DIAGNOSIS — E119 Type 2 diabetes mellitus without complications: Secondary | ICD-10-CM

## 2019-09-03 LAB — COMPREHENSIVE METABOLIC PANEL
ALT: 20 U/L (ref 0–53)
AST: 21 U/L (ref 0–37)
Albumin: 4.4 g/dL (ref 3.5–5.2)
Alkaline Phosphatase: 95 U/L (ref 39–117)
BUN: 10 mg/dL (ref 6–23)
CO2: 32 mEq/L (ref 19–32)
Calcium: 9.8 mg/dL (ref 8.4–10.5)
Chloride: 95 mEq/L — ABNORMAL LOW (ref 96–112)
Creatinine, Ser: 1.48 mg/dL (ref 0.40–1.50)
GFR: 57.25 mL/min — ABNORMAL LOW (ref >60.00)
Glucose, Bld: 93 mg/dL (ref 70–99)
Potassium: 4.4 mEq/L (ref 3.5–5.1)
Sodium: 132 mEq/L — ABNORMAL LOW (ref 135–145)
Total Bilirubin: 1.5 mg/dL — ABNORMAL HIGH (ref 0.2–1.2)
Total Protein: 7.1 g/dL (ref 6.0–8.3)

## 2019-09-03 LAB — CBC WITH DIFFERENTIAL/PLATELET
Basophils Absolute: 0.1 10*3/uL (ref 0.0–0.1)
Basophils Relative: 1.9 % (ref 0.0–3.0)
Eosinophils Absolute: 0.1 10*3/uL (ref 0.0–0.7)
Eosinophils Relative: 2.8 % (ref 0.0–5.0)
HCT: 44.1 % (ref 39.0–52.0)
Hemoglobin: 15 g/dL (ref 13.0–17.0)
Lymphocytes Relative: 29.1 % (ref 12.0–46.0)
Lymphs Abs: 1 10*3/uL (ref 0.7–4.0)
MCHC: 34 g/dL (ref 30.0–36.0)
MCV: 88.8 fl (ref 78.0–100.0)
Monocytes Absolute: 0.6 10*3/uL (ref 0.1–1.0)
Monocytes Relative: 16.1 % — ABNORMAL HIGH (ref 3.0–12.0)
Neutro Abs: 1.7 10*3/uL (ref 1.4–7.7)
Neutrophils Relative %: 50.1 % (ref 43.0–77.0)
Platelets: 278 10*3/uL (ref 150.0–400.0)
RBC: 4.97 Mil/uL (ref 4.22–5.81)
RDW: 13.5 % (ref 11.5–15.5)
WBC: 3.4 10*3/uL — ABNORMAL LOW (ref 4.0–10.5)

## 2019-09-03 LAB — LIPID PANEL
Cholesterol: 111 mg/dL (ref 0–200)
HDL: 35.9 mg/dL — ABNORMAL LOW (ref >39.00)
LDL Cholesterol: 52 mg/dL (ref 0–99)
NonHDL: 74.81
Total CHOL/HDL Ratio: 3
Triglycerides: 114 mg/dL (ref 0.0–149.0)
VLDL: 22.8 mg/dL (ref 0.0–40.0)

## 2019-09-03 LAB — HEMOGLOBIN A1C: Hgb A1c MFr Bld: 6.3 % (ref 4.6–6.5)

## 2019-09-03 MED ORDER — LOSARTAN POTASSIUM 25 MG PO TABS: 25.0000 mg | ORAL_TABLET | Freq: Every evening | ORAL | 1 refills | Status: DC

## 2019-09-03 MED ORDER — NYSTATIN-TRIAMCINOLONE 100000-0.1 UNIT/GM-% EX OINT: 1.0000 "application " | TOPICAL_OINTMENT | Freq: Two times a day (BID) | CUTANEOUS | 1 refills | Status: DC

## 2019-09-03 MED ORDER — HYDROCHLOROTHIAZIDE 25 MG PO TABS: 25.0000 mg | ORAL_TABLET | Freq: Every day | ORAL | 1 refills | Status: DC

## 2019-09-03 MED ORDER — PRAVASTATIN SODIUM 20 MG PO TABS: 20.0000 mg | ORAL_TABLET | Freq: Every day | ORAL | 1 refills | Status: DC

## 2019-09-03 NOTE — Progress Notes (Signed)
Subjective:    Patient ID: Edward Stafford, male    DOB: 05-Oct-1951, 68 y.o.   MRN: [id number]  DOS:  09/03/2019 Type of visit - description: CPX No major concerns.  In general feeling well.  BP Readings from Last 3 Encounters:  09/03/19 (!) 144/75  01/20/19 140/80  12/04/18 131/88    Review of Systems  A 14 point review of systems is negative    Past Medical History:  Diagnosis Date  . Back pain, chronic    disabled due to chronic back pain  . Chest pain    Cardiac cath (-)2008, Myoview (-) 2011  . Chronic kidney disease   . DIABETES MELLITUS, TYPE II, CONTROLLED 06/08/2008   diet controlled, pt does not check cbg   . Drug abuse-- crack, marijuana 04/19/2007   hx of  . GERD (gastroesophageal reflux disease)    no current meds  . Glaucoma   . Hyperlipidemia   . Hypertension   . Prostate cancer ([place]) 06/2018  . SINUS BRADYCARDIA 05/17/2010  . UTI (urinary tract infection)    on doxycycline for    Past Surgical History:  Procedure Laterality Date  . CHOLECYSTECTOMY    . CORONARY ANGIOPLASTY WITH STENT PLACEMENT    . PROSTATE BIOPSY    . RADIOACTIVE SEED IMPLANT N/A 11/14/2018   Procedure: RADIOACTIVE SEED IMPLANT/BRACHYTHERAPY IMPLANT;  Surgeon: Ardis Hughs, MD;  Location: [facility];  Service: Urology;  Laterality: N/A;  . SPACE OAR INSTILLATION N/A 11/14/2018   Procedure: SPACE OAR INSTILLATION;  Surgeon: Ardis Hughs, MD;  Location: [facility];  Service: Urology;  Laterality: N/A;    Social History   Socioeconomic History  . Marital status: Single    Spouse name: Not on file  . Number of children: 0  . Years of education: Not on file  . Highest education level: Not on file  Occupational History  . Occupation: Disabled.   Social Needs  . Financial resource strain: Not on file  . Food insecurity    Worry: Not on file    Inability: Not on file  . Transportation needs    Medical: Not on file     Non-medical: Not on file  Tobacco Use  . Smoking status: Former Smoker    Packs/day: 0.50    Years: 15.00    Pack years: 7.50    Types: Cigarettes    Quit date: 06/15/2013    Years since quitting: 6.2  . Smokeless tobacco: Never Used  . Tobacco comment: never heavy smoker   Substance and Sexual Activity  . Alcohol use: No    Frequency: Never    Comment: none  . Drug use: Not Currently    Comment: denies since ~ 2014, h/o marijuana-crack  . Sexual activity: Not Currently  Lifestyle  . Physical activity    Days per week: Not on file    Minutes per session: Not on file  . Stress: Not on file  Relationships  . Social Herbalist on phone: Not on file    Gets together: Not on file    Attends religious service: Not on file    Active member of club or organization: Not on file    Attends meetings of clubs or organizations: Not on file    Relationship status: Not on file  . Intimate partner violence    Fear of current or ex partner: Not on file    Emotionally abused: Not on  file    Physically abused: Not on file    Forced sexual activity: Not on file  Other Topics Concern  . Not on file  Social History Narrative   Lives by himself, has a car.    No family around, cousin is the closest relative      Family History  Problem Relation Age of Onset  . CAD Mother        age onset ?  . Colon cancer Other        cousin  . Stroke Neg Hx   . Prostate cancer Neg Hx   . Diabetes Neg Hx   . Breast cancer Neg Hx   . Pancreatic cancer Neg Hx      Allergies as of 09/03/2019   No Known Allergies     Medication List       Accurate as of September 03, 2019 11:59 PM. If you have any questions, ask your nurse or doctor.        STOP taking these medications   phenazopyridine 200 MG tablet Commonly known as: Pyridium Stopped by: Kathlene November, MD   tamsulosin 0.4 MG Caps capsule Commonly known as: FLOMAX Stopped by: Kathlene November, MD     TAKE these medications   aspirin EC  81 MG tablet Take 81 mg by mouth daily.   hydrochlorothiazide 25 MG tablet Commonly known as: HYDRODIURIL Take 1 tablet (25 mg total) by mouth daily.   losartan 25 MG tablet Commonly known as: COZAAR Take 1 tablet (25 mg total) by mouth every evening.   mirabegron ER 50 MG Tb24 tablet Commonly known as: MYRBETRIQ Take 50 mg by mouth every evening.   nystatin-triamcinolone ointment Commonly known as: MYCOLOG Apply 1 application topically 2 (two) times daily. Started by: Kathlene November, MD   pravastatin 20 MG tablet Commonly known as: PRAVACHOL Take 1 tablet (20 mg total) by mouth daily.   traMADol 50 MG tablet Commonly known as: Ultram Take 1-2 tablets (50-100 mg total) by mouth every 6 (six) hours as needed for moderate pain.           Objective:   Physical Exam BP (!) 144/75 (BP Location: Left Arm, Patient Position: Sitting, Cuff Size: Normal)   Pulse (!) 53   Temp 98.2 F (36.8 C) (Temporal)   Resp 16   Ht 6' (1.829 m)   Wt 285 lb 6 oz (129.4 kg)   SpO2 93%   BMI 38.70 kg/m  General: Well developed, NAD, BMI noted Neck: No  thyromegaly  HEENT:  Normocephalic . Face symmetric, atraumatic Lungs:  CTA B Normal respiratory effort, no intercostal retractions, no accessory muscle use. Heart: RRR,  no murmur.  No pretibial edema bilaterally  Abdomen:  Not distended, soft, non-tender. No rebound or rigidity.   Skin: Exposed areas without rash. Not pale. Not jaundice Neurologic:  alert & oriented X3.  Speech normal. Gait: Very small steps, does not lift his feet much, otherwise symmetric exam, good arm swing.  Have some difficulty transferring. Diabetic foot exam: Extremely dry skin, in a moccasin distribution.  Poor hygiene, long and dystrophic nails.  Mild decrease on pinprick examination Psych: Cognition and judgment appear intact.  Cooperative with normal attention span and concentration.  Behavior appropriate. No anxious or depressed appearing.      Assessment     Assessment   Prediabetes HTN Hyperlipidemia Bradycardia, chronic (not on  BB-CCBs, asx  ekg 11-2016: sinus brady)  CKD Glaucoma Prostate  ca: --Elevated PSA :  8.6  2, did not show for urology referral 3 as of 05-2016 -- 06/2018 (+) bx for cancer  Chronic back pain, disabled   H/o  Drug abuse, crack, marijuana  H/o CP Cath 2008 and Myoview 2011 negative  PLAN: Prediabetes: Checking A1c. Neuropathy: Denies symptoms but there is some decreased pinprick sensitivity.  Patient educated.  He has extremities long and dystrophic nails, I do not believe he can reach down  cut his nails.  Refer to podiatry, Rx Mycolog.  Reassess on RTC HTN: Currently on HCTZ, losartan, BP today 144/75, ideal 130.  The patient does not check ambulatory BPs, encourage to do Hyperlipidemia: On Pravachol, check labs Prostate cancer: Status post seed implants, currently with no symptoms. Gait, transferring: Refer to physical therapy, he will benefit from regaining some strength on his legs. Social: lives by himself, only has distant relatives RTC 6 months

## 2019-09-03 NOTE — Progress Notes (Signed)
Pre visit review using our clinic review tool, if applicable. No additional management support is needed unless otherwise documented below in the visit note. 

## 2019-09-03 NOTE — Patient Instructions (Addendum)
Per our records you are due for an eye exam. Please contact your eye doctor to schedule an appointment. Please have them send copies of your office visit notes to [place]. Our fax number is [phone].  GO TO THE LAB : Get the blood work     GO TO THE FRONT DESK Schedule your next appointment   for a checkup in 6 months   Check the  blood pressure weekly BP GOAL is between 110/65 and  135/85. If it is consistently higher or lower, let me know  Feet Care: You have neuropathy Use the cream MYCOLOG twice a day for at least 1 month We are referring you to a podiatrist   Diabetes Mellitus and [place] care is an important part of your health, especially when you have diabetes. Diabetes may cause you to have problems because of poor blood flow (circulation) to your feet and legs, which can cause your skin to:  Become thinner and drier.  Break more easily.  Heal more slowly.  Peel and crack. You may also have nerve damage (neuropathy) in your legs and feet, causing decreased feeling in them. This means that you may not notice minor injuries to your feet that could lead to more serious problems. Noticing and addressing any potential problems early is the best way to prevent future foot problems. How to care for your feet Foot hygiene  Wash your feet daily with warm water and mild soap. Do not use hot water. Then, pat your feet and the areas between your toes until they are completely dry. Do not soak your feet as this can dry your skin.  Trim your toenails straight across. Do not dig under them or around the cuticle. File the edges of your nails with an emery board or nail file.  Apply a moisturizing lotion or petroleum jelly to the skin on your feet and to dry, brittle toenails. Use lotion that does not contain alcohol and is unscented. Do not apply lotion between your toes. Shoes and socks  Wear clean socks or stockings every day. Make sure they are not too tight. Do not wear  knee-high stockings since they may decrease blood flow to your legs.  Wear shoes that fit properly and have enough cushioning. Always look in your shoes before you put them on to be sure there are no objects inside.  To break in new shoes, wear them for just a few hours a day. This prevents injuries on your feet. Wounds, scrapes, corns, and calluses  Check your feet daily for blisters, cuts, bruises, sores, and redness. If you cannot see the bottom of your feet, use a mirror or ask someone for help.  Do not cut corns or calluses or try to remove them with medicine.  If you find a minor scrape, cut, or break in the skin on your feet, keep it and the skin around it clean and dry. You may clean these areas with mild soap and water. Do not clean the area with peroxide, alcohol, or iodine.  If you have a wound, scrape, corn, or callus on your foot, look at it several times a day to make sure it is healing and not infected. Check for: ? Redness, swelling, or pain. ? Fluid or blood. ? Warmth. ? Pus or a bad smell. General instructions  Do not cross your legs. This may decrease blood flow to your feet.  Do not use heating pads or hot water bottles on your feet.  They may burn your skin. If you have lost feeling in your feet or legs, you may not know this is happening until it is too late.  Protect your feet from hot and cold by wearing shoes, such as at the beach or on hot pavement.  Schedule a complete foot exam at least once a year (annually) or more often if you have foot problems. If you have foot problems, report any cuts, sores, or bruises to your health care provider immediately. Contact a health care provider if:  You have a medical condition that increases your risk of infection and you have any cuts, sores, or bruises on your feet.  You have an injury that is not healing.  You have redness on your legs or feet.  You feel burning or tingling in your legs or feet.  You have pain  or cramps in your legs and feet.  Your legs or feet are numb.  Your feet always feel cold.  You have pain around a toenail. Get help right away if:  You have a wound, scrape, corn, or callus on your foot and: ? You have pain, swelling, or redness that gets worse. ? You have fluid or blood coming from the wound, scrape, corn, or callus. ? Your wound, scrape, corn, or callus feels warm to the touch. ? You have pus or a bad smell coming from the wound, scrape, corn, or callus. ? You have a fever. ? You have a red line going up your leg. Summary  Check your feet every day for cuts, sores, red spots, swelling, and blisters.  Moisturize feet and legs daily.  Wear shoes that fit properly and have enough cushioning.  If you have foot problems, report any cuts, sores, or bruises to your health care provider immediately.  Schedule a complete foot exam at least once a year (annually) or more often if you have foot problems. This information is not intended to replace advice given to you by your health care provider. Make sure you discuss any questions you have with your health care provider. Document Released: 11/09/2000 Document Revised: 12/25/2017 Document Reviewed: 12/14/2016 Elsevier Patient Education  2020 [employer].

## 2019-09-03 NOTE — Assessment & Plan Note (Signed)
Td 2018 - pnm 23 :2011 -prevnar 2017 - shingrix: pro/cons d/w pt, rx printed  -flu shot today -CCS: Cscope 2011, Dr Sharlett Iles, normal , 10 years - h/o  Prostate cancer  -Labs: CMP, FLP, CBC, A1c -Patient education: Diet and exercise

## 2019-09-04 MED ORDER — SHINGRIX 50 MCG/0.5ML IM SUSR: 0.5000 mL | Freq: Once | INTRAMUSCULAR | 1 refills | Status: AC

## 2019-09-04 NOTE — Assessment & Plan Note (Signed)
Prediabetes: Checking A1c. Neuropathy: Denies symptoms but there is some decreased pinprick sensitivity.  Patient educated.  He has extremities long and dystrophic nails, I do not believe he can reach down  cut his nails.  Refer to podiatry, Rx Mycolog.  Reassess on RTC HTN: Currently on HCTZ, losartan, BP today 144/75, ideal 130.  The patient does not check ambulatory BPs, encourage to do Hyperlipidemia: On Pravachol, check labs Prostate cancer: Status post seed implants, currently with no symptoms. Gait, transferring: Refer to physical therapy, he will benefit from regaining some strength on his legs. Social: lives by himself, only has distant relatives RTC 6 months

## 2019-09-07 ENCOUNTER — Other Ambulatory Visit: Payer: Self-pay

## 2019-09-07 ENCOUNTER — Ambulatory Visit: Payer: Medicare Other | Admitting: Podiatry

## 2019-09-14 ENCOUNTER — Ambulatory Visit: Payer: Medicare Other | Attending: Internal Medicine

## 2019-10-26 ENCOUNTER — Ambulatory Visit: Payer: Medicare Other | Attending: Internal Medicine | Admitting: Physical Therapy

## 2019-11-12 ENCOUNTER — Other Ambulatory Visit: Payer: Self-pay

## 2019-11-12 NOTE — Patient Outreach (Signed)
[facility]) Care Management  11/12/2019  Donnavan L Going 08-02-1951 [id number]   Medication Adherence call to Mr. Traeson Ceci Hippa Identifiers Verify spoke with patient he is past due on Losartan 25 mg,patient explain he takes 1 tablet daily,patient has pick up this medication from [facility] [place] on 09/03/19 for a 90 days supply,pharmacy said he will be due until January /2021.patient has medication until then. Mr. Hofacker is showing past due on under [place].   [place] Management Direct Dial [phone]  Fax [phone] [email or web address]

## 2019-12-08 DIAGNOSIS — R3914 Feeling of incomplete bladder emptying: Secondary | ICD-10-CM | POA: Diagnosis not present

## 2020-01-22 ENCOUNTER — Ambulatory Visit: Payer: Medicare Other | Admitting: *Deleted

## 2020-01-28 NOTE — Progress Notes (Signed)
Nurse connected with patient 01/29/20 at 11:00 AM EST by a telephone enabled telemedicine application and verified that I am speaking with the correct person using two identifiers. Patient stated full name and DOB. Patient gave permission to continue with virtual visit. Patient's location was at home and Nurse's location was at [place] office.   Subjective:   Edward Stafford is a 69 y.o. male who presents for Medicare Annual/Subsequent preventive examination.  Review of Systems: No ROS.  Medicare Wellness Virtual Visit.  Visual/audio telehealth visit, UTA vital signs.   See social history for additional risk factors.   Home Safety/Smoke Alarms: Feels safe in home. Smoke alarms in place.  Lives alone in 1st floor apt. Uses cane. Sister visits him almost daily  Male:   CCS- next due 10/17/2020    PSA-  Lab Results  Component Value Date   PSA 15.32 (H) 11/11/2017   PSA 11.16 (H) 10/02/2016   PSA 8.66 (H) 08/30/2015       Objective:    Vitals: Unable to assess. This visit is enabled though telemedicine due to Covid 19.   Advanced Directives 01/29/2020 01/20/2019 11/14/2018 04/16/2012  Does Patient Have a Medical Advance Directive? No No No Patient does not have advance directive;Patient would not like information  Would patient like information on creating a medical advance directive? No - Patient declined No - Patient declined - -    Tobacco Social History   Tobacco Use  Smoking Status Former Smoker  . Packs/day: 0.50  . Years: 15.00  . Pack years: 7.50  . Types: Cigarettes  . Quit date: 06/15/2013  . Years since quitting: 6.6  Smokeless Tobacco Never Used  Tobacco Comment   never heavy smoker      Counseling given: Not Answered Comment: never heavy smoker    Clinical Intake: Pain : No/denies pain     Past Medical History:  Diagnosis Date  . Back pain, chronic    disabled due to chronic back pain  . Chest pain    Cardiac cath (-)2008, Myoview (-) 2011   . Chronic kidney disease   . DIABETES MELLITUS, TYPE II, CONTROLLED 06/08/2008   diet controlled, pt does not check cbg   . Drug abuse-- crack, marijuana 04/19/2007   hx of  . GERD (gastroesophageal reflux disease)    no current meds  . Glaucoma   . Hyperlipidemia   . Hypertension   . Prostate cancer ([place]) 06/2018  . SINUS BRADYCARDIA 05/17/2010  . UTI (urinary tract infection)    on doxycycline for   Past Surgical History:  Procedure Laterality Date  . CHOLECYSTECTOMY    . CORONARY ANGIOPLASTY WITH STENT PLACEMENT    . PROSTATE BIOPSY    . RADIOACTIVE SEED IMPLANT N/A 11/14/2018   Procedure: RADIOACTIVE SEED IMPLANT/BRACHYTHERAPY IMPLANT;  Surgeon: Ardis Hughs, MD;  Location: [facility];  Service: Urology;  Laterality: N/A;  . SPACE OAR INSTILLATION N/A 11/14/2018   Procedure: SPACE OAR INSTILLATION;  Surgeon: Ardis Hughs, MD;  Location: [facility];  Service: Urology;  Laterality: N/A;   Family History  Problem Relation Age of Onset  . CAD Mother        age onset ?  . Colon cancer Other        cousin  . Stroke Neg Hx   . Prostate cancer Neg Hx   . Diabetes Neg Hx   . Breast cancer Neg Hx   . Pancreatic cancer Neg Hx  Social History   Socioeconomic History  . Marital status: Single    Spouse name: Not on file  . Number of children: 0  . Years of education: Not on file  . Highest education level: Not on file  Occupational History  . Occupation: Disabled.   Tobacco Use  . Smoking status: Former Smoker    Packs/day: 0.50    Years: 15.00    Pack years: 7.50    Types: Cigarettes    Quit date: 06/15/2013    Years since quitting: 6.6  . Smokeless tobacco: Never Used  . Tobacco comment: never heavy smoker   Substance and Sexual Activity  . Alcohol use: No    Comment: none  . Drug use: Not Currently    Comment: denies since ~ 2014, h/o marijuana-crack  . Sexual activity: Not Currently  Other Topics Concern  . Not  on file  Social History Narrative   Lives by himself, has a car.    No family around, cousin is the closest relative    Social Determinants of Health   Financial Resource Strain:   . Difficulty of Paying Living Expenses: Not on file  Food Insecurity:   . Worried About Charity fundraiser in the Last Year: Not on file  . Ran Out of Food in the Last Year: Not on file  Transportation Needs:   . Lack of Transportation (Medical): Not on file  . Lack of Transportation (Non-Medical): Not on file  Physical Activity:   . Days of Exercise per Week: Not on file  . Minutes of Exercise per Session: Not on file  Stress:   . Feeling of Stress : Not on file  Social Connections:   . Frequency of Communication with Friends and Family: Not on file  . Frequency of Social Gatherings with Friends and Family: Not on file  . Attends Religious Services: Not on file  . Active Member of Clubs or Organizations: Not on file  . Attends Archivist Meetings: Not on file  . Marital Status: Not on file    Outpatient Encounter Medications as of 01/29/2020  Medication Sig  . aspirin EC 81 MG tablet Take 81 mg by mouth daily.  . hydrochlorothiazide (HYDRODIURIL) 25 MG tablet Take 1 tablet (25 mg total) by mouth daily.  Marland Kitchen losartan (COZAAR) 25 MG tablet Take 1 tablet (25 mg total) by mouth every evening.  . mirabegron ER (MYRBETRIQ) 50 MG TB24 tablet Take 50 mg by mouth every evening.   . nystatin-triamcinolone ointment (MYCOLOG) Apply 1 application topically 2 (two) times daily.  . pravastatin (PRAVACHOL) 20 MG tablet Take 1 tablet (20 mg total) by mouth daily.  . traMADol (ULTRAM) 50 MG tablet Take 1-2 tablets (50-100 mg total) by mouth every 6 (six) hours as needed for moderate pain.  . tamsulosin (FLOMAX) 0.4 MG CAPS capsule Take 0.4 mg by mouth daily.   No facility-administered encounter medications on file as of 01/29/2020.    Activities of Daily Living In your present state of health, do you have  any difficulty performing the following activities: 01/29/2020  Hearing? N  Vision? N  Difficulty concentrating or making decisions? N  Walking or climbing stairs? N  Dressing or bathing? N  Doing errands, shopping? N  Preparing Food and eating ? N  Using the Toilet? N  In the past six months, have you accidently leaked urine? N  Do you have problems with loss of bowel control? N  Managing your Medications? N  Managing your Finances? N  Housekeeping or managing your Housekeeping? N  Some recent data might be hidden    Patient Care Team: Colon Branch, MD as PCP - General (Internal Medicine) Ardis Hughs, MD as Attending Physician (Urology)   Assessment:   This is a routine wellness examination for Edward Stafford. Physical assessment deferred to PCP.  Exercise Activities and Dietary recommendations Current Exercise Habits: The patient does not participate in regular exercise at present, Exercise limited by: None identified Diet (meal preparation, eat out, water intake, caffeinated beverages, dairy products, fruits and vegetables): in general, an "unhealthy" diet      Goals    . DIET - EAT MORE FRUITS AND VEGETABLES    . DIET - INCREASE WATER INTAKE    . Increase physical activity       Fall Risk Fall Risk  01/29/2020 01/20/2019 11/11/2017 09/10/2016 08/30/2015  Falls in the past year? 0 0 No No No  Number falls in past yr: 0 - - - -  Injury with Fall? 0 - - - -  Follow up Education provided;Falls prevention discussed - - - -    Depression Screen PHQ 2/9 Scores 01/29/2020 01/20/2019 09/10/2018 11/11/2017  PHQ - 2 Score 0 0 0 0    Cognitive Function Ad8 score reviewed for issues:  Issues making decisions:no  Less interest in hobbies / activities:no  Repeats questions, stories (family complaining):no  Trouble using ordinary gadgets (microwave, computer, phone):no  Forgets the month or year: no  Mismanaging finances: no  Remembering appts:no  Daily problems with  thinking and/or memory:no Ad8 score is=0     MMSE - Mini Mental State Exam 01/20/2019  Orientation to time 5  Orientation to Place 5  Registration 3  Attention/ Calculation 5  Recall 3  Language- name 2 objects 2  Language- repeat 1  Language- follow 3 step command 3  Language- read & follow direction 1  Write a sentence 1  Copy design 1  Total score 30        Immunization History  Administered Date(s) Administered  . Fluad Quad(high Dose 65+) 09/03/2019  . Influenza Whole 08/31/2009, 09/15/2010  . Influenza, High Dose Seasonal PF 08/30/2015, 11/11/2017, 10/22/2018  . Influenza,inj,Quad PF,6+ Mos 10/01/2014, 09/10/2016  . Pneumococcal Conjugate-13 06/06/2016  . Pneumococcal Polysaccharide-23 09/15/2010  . Td 01/27/2007, 11/11/2017    Screening Tests Health Maintenance  Topic Date Due  . OPHTHALMOLOGY EXAM  12/30/1961  . PNA vac Low Risk Adult (2 of 2 - PPSV23) 06/06/2017  . COLONOSCOPY  11/27/2019  . HEMOGLOBIN A1C  03/03/2020  . FOOT EXAM  09/02/2020  . TETANUS/TDAP  11/12/2027  . INFLUENZA VACCINE  Completed  . Hepatitis C Screening  Completed     Plan:    Please schedule your next medicare wellness visit with me in 1 yr.  Continue to eat heart healthy diet (full of fruits, vegetables, whole grains, lean protein, water--limit salt, fat, and sugar intake) and increase physical activity as tolerated.  Continue doing brain stimulating activities (puzzles, reading, adult coloring books, staying active) to keep memory sharp.    I have personally reviewed and noted the following in the patient's chart:   . Medical and social history . Use of alcohol, tobacco or illicit drugs  . Current medications and supplements . Functional ability and status . Nutritional status . Physical activity . Advanced directives . List of other physicians . Hospitalizations, surgeries, and ER visits in previous 12 months . Vitals . Screenings to  include cognitive, depression,  and falls . Referrals and appointments  In addition, I have reviewed and discussed with patient certain preventive protocols, quality metrics, and best practice recommendations. A written personalized care plan for preventive services as well as general preventive health recommendations were provided to patient.     Shela Nevin, [place]  01/29/2020

## 2020-01-29 ENCOUNTER — Other Ambulatory Visit: Payer: Self-pay

## 2020-01-29 ENCOUNTER — Encounter: Payer: Self-pay | Admitting: *Deleted

## 2020-01-29 ENCOUNTER — Ambulatory Visit (INDEPENDENT_AMBULATORY_CARE_PROVIDER_SITE_OTHER): Payer: Medicare Other | Admitting: *Deleted

## 2020-01-29 DIAGNOSIS — Z0000 Encounter for general adult medical examination without abnormal findings: Secondary | ICD-10-CM

## 2020-01-29 NOTE — Patient Instructions (Signed)
Please schedule your next medicare wellness visit with me in 1 yr.  Continue to eat heart healthy diet (full of fruits, vegetables, whole grains, lean protein, water--limit salt, fat, and sugar intake) and increase physical activity as tolerated.  Continue doing brain stimulating activities (puzzles, reading, adult coloring books, staying active) to keep memory sharp.    [name] , Thank you for taking time to come for your Medicare Wellness Visit. I appreciate your ongoing commitment to your health goals. Please review the following plan we discussed and let me know if I can assist you in the future.   These are the goals we discussed: Goals    . DIET - EAT MORE FRUITS AND VEGETABLES    . DIET - INCREASE WATER INTAKE    . Increase physical activity       This is a list of the screening recommended for you and due dates:  Health Maintenance  Topic Date Due  . Eye exam for diabetics  12/30/1961  . Pneumonia vaccines (2 of 2 - PPSV23) 06/06/2017  . Colon Cancer Screening  11/27/2019  . Hemoglobin A1C  03/03/2020  . Complete foot exam   09/02/2020  . Tetanus Vaccine  11/12/2027  . Flu Shot  Completed  .  Hepatitis C: One time screening is recommended by Center for Disease Control  (CDC) for  adults born from [birth year] through 1964.   Completed    Preventive Care 68 Years and Older, Male Preventive care refers to lifestyle choices and visits with your health care provider that can promote health and wellness. This includes:  A yearly physical exam. This is also called an annual well check.  Regular dental and eye exams.  Immunizations.  Screening for certain conditions.  Healthy lifestyle choices, such as diet and exercise. What can I expect for my preventive care visit? Physical exam Your health care provider will check:  Height and weight. These may be used to calculate body mass index (BMI), which is a measurement that tells if you are at a healthy weight.  Heart rate and  blood pressure.  Your skin for abnormal spots. Counseling Your health care provider may ask you questions about:  Alcohol, tobacco, and drug use.  Emotional well-being.  Home and relationship well-being.  Sexual activity.  Eating habits.  History of falls.  Memory and ability to understand (cognition).  Work and work Statistician. What immunizations do I need?  Influenza (flu) vaccine  This is recommended every year. Tetanus, diphtheria, and pertussis (Tdap) vaccine  You may need a Td booster every 10 years. Varicella (chickenpox) vaccine  You may need this vaccine if you have not already been vaccinated. Zoster (shingles) vaccine  You may need this after age 68. Pneumococcal conjugate (PCV13) vaccine  One dose is recommended after age 1. Pneumococcal polysaccharide (PPSV23) vaccine  One dose is recommended after age 68. Measles, mumps, and rubella (MMR) vaccine  You may need at least one dose of MMR if you were born in 1957 or later. You may also need a second dose. Meningococcal conjugate (MenACWY) vaccine  You may need this if you have certain conditions. Hepatitis A vaccine  You may need this if you have certain conditions or if you travel or work in places where you may be exposed to hepatitis A. Hepatitis B vaccine  You may need this if you have certain conditions or if you travel or work in places where you may be exposed to hepatitis B. Haemophilus influenzae type  b (Hib) vaccine  You may need this if you have certain conditions. You may receive vaccines as individual doses or as more than one vaccine together in one shot (combination vaccines). Talk with your health care provider about the risks and benefits of combination vaccines. What tests do I need? Blood tests  Lipid and cholesterol levels. These may be checked every 5 years, or more frequently depending on your overall health.  Hepatitis C test.  Hepatitis B test. Screening  Lung  cancer screening. You may have this screening every year starting at age 68 if you have a 30-pack-year history of smoking and currently smoke or have quit within the past 15 years.  Colorectal cancer screening. All adults should have this screening starting at age 68 and continuing until age 68. Your health care provider may recommend screening at age 68 if you are at increased risk. You will have tests every 1-10 years, depending on your results and the type of screening test.  Prostate cancer screening. Recommendations will vary depending on your family history and other risks.  Diabetes screening. This is done by checking your blood sugar (glucose) after you have not eaten for a while (fasting). You may have this done every 1-3 years.  Abdominal aortic aneurysm (AAA) screening. You may need this if you are a current or former smoker.  Sexually transmitted disease (STD) testing. Follow these instructions at home: Eating and drinking  Eat a diet that includes fresh fruits and vegetables, whole grains, lean protein, and low-fat dairy products. Limit your intake of foods with high amounts of sugar, saturated fats, and salt.  Take vitamin and mineral supplements as recommended by your health care provider.  Do not drink alcohol if your health care provider tells you not to drink.  If you drink alcohol: ? Limit how much you have to 0-2 drinks a day. ? Be aware of how much alcohol is in your drink. In the [place] one drink equals one 12 oz bottle of beer (355 mL), one 5 oz glass of wine (148 mL), or one 1 oz glass of hard liquor (44 mL). Lifestyle  Take daily care of your teeth and gums.  Stay active. Exercise for at least 30 minutes on 5 or more days each week.  Do not use any products that contain nicotine or tobacco, such as cigarettes, e-cigarettes, and chewing tobacco. If you need help quitting, ask your health care provider.  If you are sexually active, practice safe sex. Use a condom  or other form of protection to prevent STIs (sexually transmitted infections).  Talk with your health care provider about taking a low-dose aspirin or statin. What's next?  Visit your health care provider once a year for a well check visit.  Ask your health care provider how often you should have your eyes and teeth checked.  Stay up to date on all vaccines. This information is not intended to replace advice given to you by your health care provider. Make sure you discuss any questions you have with your health care provider. Document Revised: 11/06/2018 Document Reviewed: 11/06/2018 Elsevier Patient Education  2020 [employer].

## 2020-02-26 ENCOUNTER — Other Ambulatory Visit: Payer: Self-pay | Admitting: Internal Medicine

## 2020-03-02 ENCOUNTER — Other Ambulatory Visit: Payer: Self-pay

## 2020-03-03 ENCOUNTER — Ambulatory Visit: Payer: Medicare Other | Admitting: Internal Medicine

## 2020-04-26 ENCOUNTER — Telehealth: Payer: Self-pay | Admitting: Internal Medicine

## 2020-04-26 NOTE — Telephone Encounter (Signed)
Last OV 08/2019- needs visit, this can be completed at time of OV.

## 2020-04-26 NOTE — Telephone Encounter (Signed)
LVM for patient to call back to schedule appointment °

## 2020-04-26 NOTE — Telephone Encounter (Signed)
Caller : [place]  Call Back # [phone]  Patient states he needs DVM Disability form filled out, patient has two cars  Please advise

## 2020-04-26 NOTE — Telephone Encounter (Signed)
Appt scheduled 05/02/20.

## 2020-04-29 ENCOUNTER — Ambulatory Visit: Payer: Medicare Other | Admitting: Internal Medicine

## 2020-05-02 ENCOUNTER — Ambulatory Visit: Payer: Medicare Other | Admitting: Internal Medicine

## 2020-05-20 ENCOUNTER — Encounter: Payer: Self-pay | Admitting: Internal Medicine

## 2020-05-20 ENCOUNTER — Other Ambulatory Visit: Payer: Self-pay

## 2020-05-20 ENCOUNTER — Ambulatory Visit (INDEPENDENT_AMBULATORY_CARE_PROVIDER_SITE_OTHER): Payer: Medicare Other | Admitting: Internal Medicine

## 2020-05-20 VITALS — BP 138/92 | HR 67 | Temp 97.1°F | Resp 18 | Ht 72.0 in | Wt 247.2 lb

## 2020-05-20 DIAGNOSIS — R269 Unspecified abnormalities of gait and mobility: Secondary | ICD-10-CM

## 2020-05-20 DIAGNOSIS — R739 Hyperglycemia, unspecified: Secondary | ICD-10-CM

## 2020-05-20 DIAGNOSIS — I10 Essential (primary) hypertension: Secondary | ICD-10-CM

## 2020-05-20 DIAGNOSIS — R634 Abnormal weight loss: Secondary | ICD-10-CM | POA: Diagnosis not present

## 2020-05-20 NOTE — Progress Notes (Signed)
Subjective:    Patient ID: Edward Stafford, male    DOB: [birth year], 69 y.o.   MRN: [id number]  DOS:  05/20/2020 Type of visit - description: Routine checkup Today we talk about prediabetes, high blood pressure, poor mobility.  Wt Readings from Last 3 Encounters:  05/20/20 247 lb 4 oz (112.2 kg)  09/03/19 285 lb 6 oz (129.4 kg)  01/20/19 278 lb 6.4 oz (126.3 kg)      Review of Systems  Reports that last week had a mechanical fall at home, he had a hard time getting up, no major injuries. Denies abdominal pain, nausea, vomiting, blood in the stools. No fever chills. No headaches   Past Medical History:  Diagnosis Date  . Back pain, chronic    disabled due to chronic back pain  . Chest pain    Cardiac cath (-)2008, Myoview (-) 2011  . Chronic kidney disease   . Drug abuse-- crack, marijuana 04/19/2007   hx of  . GERD (gastroesophageal reflux disease)    no current meds  . Glaucoma   . Hyperglycemia   . Hyperlipidemia   . Hypertension   . Prostate cancer ([place]) 06/2018  . SINUS BRADYCARDIA 05/17/2010  . UTI (urinary tract infection)    on doxycycline for    Past Surgical History:  Procedure Laterality Date  . CHOLECYSTECTOMY    . CORONARY ANGIOPLASTY WITH STENT PLACEMENT    . PROSTATE BIOPSY    . RADIOACTIVE SEED IMPLANT N/A 11/14/2018   Procedure: RADIOACTIVE SEED IMPLANT/BRACHYTHERAPY IMPLANT;  Surgeon: Ardis Hughs, MD;  Location: [facility];  Service: Urology;  Laterality: N/A;  . SPACE OAR INSTILLATION N/A 11/14/2018   Procedure: SPACE OAR INSTILLATION;  Surgeon: Ardis Hughs, MD;  Location: [facility];  Service: Urology;  Laterality: N/A;    Allergies as of 05/20/2020   No Known Allergies     Medication List       Accurate as of May 20, 2020 11:59 PM. If you have any questions, ask your nurse or doctor.        STOP taking these medications   traMADol 50 MG tablet Commonly known as: Ultram Stopped  by: Kathlene November, MD     TAKE these medications   aspirin EC 81 MG tablet Take 81 mg by mouth daily.   hydrochlorothiazide 25 MG tablet Commonly known as: HYDRODIURIL Take 1 tablet (25 mg total) by mouth daily.   losartan 25 MG tablet Commonly known as: COZAAR Take 1 tablet (25 mg total) by mouth every evening.   mirabegron ER 50 MG Tb24 tablet Commonly known as: MYRBETRIQ Take 50 mg by mouth every evening.   nystatin-triamcinolone ointment Commonly known as: MYCOLOG Apply 1 application topically 2 (two) times daily.   pravastatin 20 MG tablet Commonly known as: PRAVACHOL Take 1 tablet (20 mg total) by mouth daily.   tamsulosin 0.4 MG Caps capsule Commonly known as: FLOMAX Take 0.4 mg by mouth daily.          Objective:   Physical Exam BP (!) 138/92 (BP Location: Left Arm, Patient Position: Sitting, Cuff Size: Normal)   Pulse 67   Temp (!) 97.1 F (36.2 C) (Temporal)   Resp 18   Ht 6' (1.829 m)   Wt 247 lb 4 oz (112.2 kg)   SpO2 97%   BMI 33.53 kg/m  General:   Well developed, NAD, BMI noted. HEENT:  Normocephalic . Face symmetric, atraumatic Lungs:  CTA B  Normal respiratory effort, no intercostal retractions, no accessory muscle use. Heart: RRR,  no murmur.  Lower extremities: no pretibial edema bilaterally  Skin: Not pale. Not jaundice Neurologic:  alert & oriented X3.  Speech normal, transferring from the chair to the table was very difficult, could not get out the table. Psych--  Cognition and judgment appear intact.  Cooperative with normal attention span and concentration.  Behavior appropriate. No anxious or depressed appearing.      Assessment     Assessment   Prediabetes Neuropathy HTN Hyperlipidemia Bradycardia, chronic (not on  BB-CCBs, asx  ekg 11-2016: sinus brady)  CKD Glaucoma Prostate  ca: --Elevated PSA : 8.6  2, did not show for urology referral 3 as of 05-2016 -- 06/2018 (+) bx for cancer, seed implants 10/2018 Chronic back  pain, disabled   H/o  Drug abuse, crack, marijuana  H/o CP Cath 2008 and Myoview 2011 negative  PLAN: Prediabetes: Check A1c Neuropathy: See last visit.  Referral to podiatry failed.  Declines further referral. HTN: BP today slightly elevated, I asked him to check blood pressures at home, states he will.  See AVS.  Continue HCTZ, losartan, check BMP. Gait difficulty: Last visit was referred to physical therapy, did not go, does not like to be rescheduled.  I noted he has difficulty with transferring as well, he had a fall last week.  I stressed the importance of decrease risk of to further falls by doing physical therapy, still declines.  States he has a cane at home, encouraged to use. Preventive care: Reports had 2 Covid shots. Weight loss: Noted on our scales, patient was not aware, ROS negative, reassess on RTC RTC October 2021 CPX   This visit occurred during the SARS-CoV-2 public health emergency.  Safety protocols were in place, including screening questions prior to the visit, additional usage of staff PPE, and extensive cleaning of exam room while observing appropriate contact time as indicated for disinfecting solutions.

## 2020-05-20 NOTE — Patient Instructions (Addendum)
Please call with your COVID-19 vaccine dates.   Per our records you are due for an eye exam. Please contact your eye doctor to schedule an appointment. Please have them send copies of your office visit notes to [place]. Our fax number is [phone].  Get imaging to check your blood pressure, check once weekly. BP GOAL is between 110/65 and  135/85. If it is consistently higher or lower, let me know      GO TO THE LAB : Get the blood work     [place], [place] Come back for for physical exam by October 2021

## 2020-05-20 NOTE — Progress Notes (Signed)
Pre visit review using our clinic review tool, if applicable. No additional management support is needed unless otherwise documented below in the visit note. 

## 2020-05-21 LAB — BASIC METABOLIC PANEL
BUN/Creatinine Ratio: 9 (calc) (ref 6–22)
BUN: 18 mg/dL (ref 7–25)
CO2: 28 mmol/L (ref 20–32)
Calcium: 9.1 mg/dL (ref 8.6–10.3)
Chloride: 96 mmol/L — ABNORMAL LOW (ref 98–110)
Creat: 1.95 mg/dL — ABNORMAL HIGH (ref 0.70–1.25)
Glucose, Bld: 97 mg/dL (ref 65–99)
Potassium: 3.9 mmol/L (ref 3.5–5.3)
Sodium: 133 mmol/L — ABNORMAL LOW (ref 135–146)

## 2020-05-21 LAB — HEMOGLOBIN A1C
Hgb A1c MFr Bld: 5.6 % of total Hgb (ref <5.7)
Mean Plasma Glucose: 114 (calc)
eAG (mmol/L): 6.3 (calc)

## 2020-05-21 NOTE — Assessment & Plan Note (Signed)
Prediabetes: Check A1c Neuropathy: See last visit.  Referral to podiatry failed.  Declines further referral. HTN: BP today slightly elevated, I asked him to check blood pressures at home, states he will.  See AVS.  Continue HCTZ, losartan, check BMP. Gait difficulty: Last visit was referred to physical therapy, did not go, does not like to be rescheduled.  I noted he has difficulty with transferring as well, he had a fall last week.  I stressed the importance of decrease risk of to further falls by doing physical therapy, still declines.  States he has a cane at home, encouraged to use. Preventive care: Reports had 2 Covid shots. Weight loss: Noted on our scales, patient was not aware, ROS negative, reassess on RTC [facility] October 2021 CPX

## 2020-05-23 NOTE — Addendum Note (Signed)
Addended byDamita Dunnings D on: 05/23/2020 01:49 PM   Modules accepted: Orders

## 2020-05-24 ENCOUNTER — Other Ambulatory Visit: Payer: Self-pay | Admitting: Internal Medicine

## 2020-06-18 ENCOUNTER — Other Ambulatory Visit: Payer: Self-pay

## 2020-06-18 ENCOUNTER — Encounter (HOSPITAL_COMMUNITY): Payer: Self-pay | Admitting: Emergency Medicine

## 2020-06-18 DIAGNOSIS — S0990XA Unspecified injury of head, initial encounter: Secondary | ICD-10-CM | POA: Diagnosis not present

## 2020-06-18 DIAGNOSIS — F0280 Dementia in other diseases classified elsewhere without behavioral disturbance: Secondary | ICD-10-CM | POA: Diagnosis present

## 2020-06-18 DIAGNOSIS — M549 Dorsalgia, unspecified: Secondary | ICD-10-CM | POA: Diagnosis present

## 2020-06-18 DIAGNOSIS — Z6830 Body mass index (BMI) 30.0-30.9, adult: Secondary | ICD-10-CM

## 2020-06-18 DIAGNOSIS — G629 Polyneuropathy, unspecified: Secondary | ICD-10-CM | POA: Diagnosis present

## 2020-06-18 DIAGNOSIS — I2510 Atherosclerotic heart disease of native coronary artery without angina pectoris: Secondary | ICD-10-CM | POA: Diagnosis present

## 2020-06-18 DIAGNOSIS — R000 Tachycardia, unspecified: Secondary | ICD-10-CM | POA: Diagnosis not present

## 2020-06-18 DIAGNOSIS — R627 Adult failure to thrive: Secondary | ICD-10-CM | POA: Diagnosis present

## 2020-06-18 DIAGNOSIS — Z800 Family history of malignant neoplasm of digestive organs: Secondary | ICD-10-CM

## 2020-06-18 DIAGNOSIS — N400 Enlarged prostate without lower urinary tract symptoms: Secondary | ICD-10-CM | POA: Diagnosis present

## 2020-06-18 DIAGNOSIS — G20 Parkinson's disease: Secondary | ICD-10-CM | POA: Diagnosis not present

## 2020-06-18 DIAGNOSIS — Z6833 Body mass index (BMI) 33.0-33.9, adult: Secondary | ICD-10-CM

## 2020-06-18 DIAGNOSIS — W19XXXA Unspecified fall, initial encounter: Secondary | ICD-10-CM | POA: Diagnosis present

## 2020-06-18 DIAGNOSIS — E782 Mixed hyperlipidemia: Secondary | ICD-10-CM | POA: Diagnosis present

## 2020-06-18 DIAGNOSIS — E538 Deficiency of other specified B group vitamins: Secondary | ICD-10-CM | POA: Diagnosis present

## 2020-06-18 DIAGNOSIS — R001 Bradycardia, unspecified: Secondary | ICD-10-CM | POA: Diagnosis present

## 2020-06-18 DIAGNOSIS — I129 Hypertensive chronic kidney disease with stage 1 through stage 4 chronic kidney disease, or unspecified chronic kidney disease: Secondary | ICD-10-CM | POA: Diagnosis present

## 2020-06-18 DIAGNOSIS — R739 Hyperglycemia, unspecified: Secondary | ICD-10-CM | POA: Diagnosis present

## 2020-06-18 DIAGNOSIS — E860 Dehydration: Secondary | ICD-10-CM | POA: Diagnosis present

## 2020-06-18 DIAGNOSIS — N3289 Other specified disorders of bladder: Secondary | ICD-10-CM | POA: Diagnosis not present

## 2020-06-18 DIAGNOSIS — Z20822 Contact with and (suspected) exposure to covid-19: Secondary | ICD-10-CM | POA: Diagnosis not present

## 2020-06-18 DIAGNOSIS — D631 Anemia in chronic kidney disease: Secondary | ICD-10-CM | POA: Diagnosis present

## 2020-06-18 DIAGNOSIS — I7090 Unspecified atherosclerosis: Secondary | ICD-10-CM | POA: Diagnosis not present

## 2020-06-18 DIAGNOSIS — R4189 Other symptoms and signs involving cognitive functions and awareness: Secondary | ICD-10-CM | POA: Diagnosis present

## 2020-06-18 DIAGNOSIS — R296 Repeated falls: Secondary | ICD-10-CM | POA: Diagnosis not present

## 2020-06-18 DIAGNOSIS — N189 Chronic kidney disease, unspecified: Secondary | ICD-10-CM | POA: Diagnosis not present

## 2020-06-18 DIAGNOSIS — N179 Acute kidney failure, unspecified: Principal | ICD-10-CM | POA: Diagnosis present

## 2020-06-18 DIAGNOSIS — M6282 Rhabdomyolysis: Secondary | ICD-10-CM | POA: Diagnosis not present

## 2020-06-18 DIAGNOSIS — Y92009 Unspecified place in unspecified non-institutional (private) residence as the place of occurrence of the external cause: Secondary | ICD-10-CM

## 2020-06-18 DIAGNOSIS — E669 Obesity, unspecified: Secondary | ICD-10-CM | POA: Diagnosis present

## 2020-06-18 DIAGNOSIS — D709 Neutropenia, unspecified: Secondary | ICD-10-CM | POA: Diagnosis present

## 2020-06-18 DIAGNOSIS — R9082 White matter disease, unspecified: Secondary | ICD-10-CM | POA: Diagnosis not present

## 2020-06-18 DIAGNOSIS — I959 Hypotension, unspecified: Secondary | ICD-10-CM | POA: Diagnosis not present

## 2020-06-18 DIAGNOSIS — E440 Moderate protein-calorie malnutrition: Secondary | ICD-10-CM | POA: Diagnosis present

## 2020-06-18 DIAGNOSIS — Z955 Presence of coronary angioplasty implant and graft: Secondary | ICD-10-CM

## 2020-06-18 DIAGNOSIS — F0150 Vascular dementia without behavioral disturbance: Secondary | ICD-10-CM | POA: Diagnosis present

## 2020-06-18 DIAGNOSIS — R531 Weakness: Secondary | ICD-10-CM | POA: Diagnosis not present

## 2020-06-18 DIAGNOSIS — R2689 Other abnormalities of gait and mobility: Secondary | ICD-10-CM | POA: Diagnosis not present

## 2020-06-18 DIAGNOSIS — Z9049 Acquired absence of other specified parts of digestive tract: Secondary | ICD-10-CM

## 2020-06-18 DIAGNOSIS — N1831 Chronic kidney disease, stage 3a: Secondary | ICD-10-CM | POA: Diagnosis not present

## 2020-06-18 DIAGNOSIS — Z743 Need for continuous supervision: Secondary | ICD-10-CM | POA: Diagnosis not present

## 2020-06-18 DIAGNOSIS — E872 Acidosis: Secondary | ICD-10-CM | POA: Diagnosis not present

## 2020-06-18 DIAGNOSIS — Z8249 Family history of ischemic heart disease and other diseases of the circulatory system: Secondary | ICD-10-CM

## 2020-06-18 DIAGNOSIS — K219 Gastro-esophageal reflux disease without esophagitis: Secondary | ICD-10-CM | POA: Diagnosis present

## 2020-06-18 DIAGNOSIS — G8929 Other chronic pain: Secondary | ICD-10-CM | POA: Diagnosis present

## 2020-06-18 DIAGNOSIS — R6889 Other general symptoms and signs: Secondary | ICD-10-CM | POA: Diagnosis not present

## 2020-06-18 DIAGNOSIS — Z87891 Personal history of nicotine dependence: Secondary | ICD-10-CM

## 2020-06-18 DIAGNOSIS — H409 Unspecified glaucoma: Secondary | ICD-10-CM | POA: Diagnosis present

## 2020-06-18 DIAGNOSIS — Z8546 Personal history of malignant neoplasm of prostate: Secondary | ICD-10-CM

## 2020-06-18 DIAGNOSIS — R29818 Other symptoms and signs involving the nervous system: Secondary | ICD-10-CM | POA: Diagnosis not present

## 2020-06-18 DIAGNOSIS — I9589 Other hypotension: Secondary | ICD-10-CM | POA: Diagnosis not present

## 2020-06-18 DIAGNOSIS — Z7982 Long term (current) use of aspirin: Secondary | ICD-10-CM

## 2020-06-18 DIAGNOSIS — E876 Hypokalemia: Secondary | ICD-10-CM | POA: Diagnosis present

## 2020-06-18 DIAGNOSIS — I10 Essential (primary) hypertension: Secondary | ICD-10-CM | POA: Diagnosis not present

## 2020-06-18 DIAGNOSIS — R32 Unspecified urinary incontinence: Secondary | ICD-10-CM | POA: Diagnosis present

## 2020-06-18 DIAGNOSIS — Z79899 Other long term (current) drug therapy: Secondary | ICD-10-CM

## 2020-06-18 DIAGNOSIS — I6389 Other cerebral infarction: Secondary | ICD-10-CM | POA: Diagnosis not present

## 2020-06-18 NOTE — ED Triage Notes (Addendum)
Patient comes in s/p fall stating he can't walk. Patient is able to bear weight. Denies pain.

## 2020-06-19 ENCOUNTER — Inpatient Hospital Stay (HOSPITAL_COMMUNITY)
Admission: EM | Admit: 2020-06-19 | Discharge: 2020-06-24 | DRG: 683 | Disposition: A | Discharge status: Skilled Nursing Facility | Payer: Medicare Other | Attending: Internal Medicine | Admitting: Internal Medicine

## 2020-06-19 ENCOUNTER — Observation Stay (HOSPITAL_COMMUNITY): Payer: Medicare Other

## 2020-06-19 ENCOUNTER — Emergency Department (HOSPITAL_COMMUNITY): Payer: Medicare Other

## 2020-06-19 DIAGNOSIS — E872 Acidosis: Secondary | ICD-10-CM | POA: Diagnosis present

## 2020-06-19 DIAGNOSIS — R296 Repeated falls: Secondary | ICD-10-CM

## 2020-06-19 DIAGNOSIS — R531 Weakness: Secondary | ICD-10-CM

## 2020-06-19 DIAGNOSIS — N400 Enlarged prostate without lower urinary tract symptoms: Secondary | ICD-10-CM

## 2020-06-19 DIAGNOSIS — I959 Hypotension, unspecified: Secondary | ICD-10-CM

## 2020-06-19 DIAGNOSIS — R2689 Other abnormalities of gait and mobility: Secondary | ICD-10-CM | POA: Diagnosis present

## 2020-06-19 DIAGNOSIS — G629 Polyneuropathy, unspecified: Secondary | ICD-10-CM

## 2020-06-19 DIAGNOSIS — D709 Neutropenia, unspecified: Secondary | ICD-10-CM

## 2020-06-19 DIAGNOSIS — E440 Moderate protein-calorie malnutrition: Secondary | ICD-10-CM | POA: Insufficient documentation

## 2020-06-19 DIAGNOSIS — Y92009 Unspecified place in unspecified non-institutional (private) residence as the place of occurrence of the external cause: Secondary | ICD-10-CM

## 2020-06-19 DIAGNOSIS — R000 Tachycardia, unspecified: Secondary | ICD-10-CM

## 2020-06-19 DIAGNOSIS — I10 Essential (primary) hypertension: Secondary | ICD-10-CM | POA: Diagnosis present

## 2020-06-19 DIAGNOSIS — R9082 White matter disease, unspecified: Secondary | ICD-10-CM | POA: Diagnosis not present

## 2020-06-19 DIAGNOSIS — I6389 Other cerebral infarction: Secondary | ICD-10-CM | POA: Diagnosis not present

## 2020-06-19 DIAGNOSIS — N179 Acute kidney failure, unspecified: Principal | ICD-10-CM

## 2020-06-19 DIAGNOSIS — N183 Chronic kidney disease, stage 3 unspecified: Secondary | ICD-10-CM | POA: Diagnosis present

## 2020-06-19 DIAGNOSIS — W19XXXA Unspecified fall, initial encounter: Secondary | ICD-10-CM

## 2020-06-19 DIAGNOSIS — I495 Sick sinus syndrome: Secondary | ICD-10-CM | POA: Diagnosis present

## 2020-06-19 DIAGNOSIS — R29818 Other symptoms and signs involving the nervous system: Secondary | ICD-10-CM | POA: Diagnosis not present

## 2020-06-19 DIAGNOSIS — M6282 Rhabdomyolysis: Secondary | ICD-10-CM | POA: Diagnosis present

## 2020-06-19 DIAGNOSIS — E8729 Other acidosis: Secondary | ICD-10-CM | POA: Diagnosis present

## 2020-06-19 DIAGNOSIS — E538 Deficiency of other specified B group vitamins: Secondary | ICD-10-CM

## 2020-06-19 DIAGNOSIS — E876 Hypokalemia: Secondary | ICD-10-CM

## 2020-06-19 DIAGNOSIS — E782 Mixed hyperlipidemia: Secondary | ICD-10-CM | POA: Diagnosis present

## 2020-06-19 LAB — COMPREHENSIVE METABOLIC PANEL
ALT: 28 U/L (ref 0–44)
AST: 41 U/L (ref 15–41)
Albumin: 4.2 g/dL (ref 3.5–5.0)
Alkaline Phosphatase: 85 U/L (ref 38–126)
Anion gap: 18 — ABNORMAL HIGH (ref 5–15)
BUN: 32 mg/dL — ABNORMAL HIGH (ref 8–23)
CO2: 21 mmol/L — ABNORMAL LOW (ref 22–32)
Calcium: 9.3 mg/dL (ref 8.9–10.3)
Chloride: 97 mmol/L — ABNORMAL LOW (ref 98–111)
Creatinine, Ser: 2.92 mg/dL — ABNORMAL HIGH (ref 0.61–1.24)
GFR calc Af Amer: 24 mL/min — ABNORMAL LOW (ref >60)
GFR calc non Af Amer: 21 mL/min — ABNORMAL LOW (ref >60)
Glucose, Bld: 122 mg/dL — ABNORMAL HIGH (ref 70–99)
Potassium: 3.7 mmol/L (ref 3.5–5.1)
Sodium: 136 mmol/L (ref 135–145)
Total Bilirubin: 1.3 mg/dL — ABNORMAL HIGH (ref 0.3–1.2)
Total Protein: 8.3 g/dL — ABNORMAL HIGH (ref 6.5–8.1)

## 2020-06-19 LAB — LIPID PANEL
Cholesterol: 114 mg/dL (ref 0–200)
HDL: 33 mg/dL — ABNORMAL LOW (ref >40)
LDL Cholesterol: 60 mg/dL (ref 0–99)
Total CHOL/HDL Ratio: 3.5 RATIO
Triglycerides: 106 mg/dL (ref <150)
VLDL: 21 mg/dL (ref 0–40)

## 2020-06-19 LAB — URINALYSIS, ROUTINE W REFLEX MICROSCOPIC
Bilirubin Urine: NEGATIVE
Glucose, UA: NEGATIVE mg/dL
Ketones, ur: 5 mg/dL — AB
Leukocytes,Ua: NEGATIVE
Nitrite: NEGATIVE
Protein, ur: 100 mg/dL — AB
Specific Gravity, Urine: 1.024 (ref 1.005–1.030)
pH: 5 (ref 5.0–8.0)

## 2020-06-19 LAB — LACTIC ACID, PLASMA
Lactic Acid, Venous: 2.4 mmol/L (ref 0.5–1.9)
Lactic Acid, Venous: 1.9 mmol/L (ref 0.5–1.9)

## 2020-06-19 LAB — CBC WITH DIFFERENTIAL/PLATELET
Abs Immature Granulocytes: 0.03 10*3/uL (ref 0.00–0.07)
Basophils Absolute: 0 10*3/uL (ref 0.0–0.1)
Basophils Relative: 0 %
Eosinophils Absolute: 0 10*3/uL (ref 0.0–0.5)
Eosinophils Relative: 0 %
HCT: 47 % (ref 39.0–52.0)
Hemoglobin: 15.1 g/dL (ref 13.0–17.0)
Immature Granulocytes: 0 %
Lymphocytes Relative: 5 %
Lymphs Abs: 0.3 10*3/uL — ABNORMAL LOW (ref 0.7–4.0)
MCH: 28.7 pg (ref 26.0–34.0)
MCHC: 32.1 g/dL (ref 30.0–36.0)
MCV: 89.4 fL (ref 80.0–100.0)
Monocytes Absolute: 0.6 10*3/uL (ref 0.1–1.0)
Monocytes Relative: 9 %
Neutro Abs: 5.8 10*3/uL (ref 1.7–7.7)
Neutrophils Relative %: 86 %
Platelets: 296 10*3/uL (ref 150–400)
RBC: 5.26 MIL/uL (ref 4.22–5.81)
RDW: 13.1 % (ref 11.5–15.5)
WBC: 6.7 10*3/uL (ref 4.0–10.5)
nRBC: 0 % (ref 0.0–0.2)

## 2020-06-19 LAB — CK: Total CK: 539 U/L — ABNORMAL HIGH (ref 49–397)

## 2020-06-19 LAB — BETA-HYDROXYBUTYRIC ACID: Beta-Hydroxybutyric Acid: 0.67 mmol/L — ABNORMAL HIGH (ref 0.05–0.27)

## 2020-06-19 LAB — SARS CORONAVIRUS 2 BY RT PCR (HOSPITAL ORDER, PERFORMED IN ~~LOC~~ HOSPITAL LAB): SARS Coronavirus 2: NEGATIVE

## 2020-06-19 LAB — VITAMIN B12: Vitamin B-12: 191 pg/mL (ref 180–914)

## 2020-06-19 MED ORDER — SODIUM CHLORIDE 0.9 % IV BOLUS
500.0000 mL | Freq: Once | INTRAVENOUS | Status: AC
  Administered 2020-06-19: 500 mL via INTRAVENOUS

## 2020-06-19 MED ORDER — ASPIRIN EC 81 MG PO TBEC
81.0000 mg | DELAYED_RELEASE_TABLET | Freq: Every day | ORAL | Status: DC
  Administered 2020-06-20 – 2020-06-24 (×5): 81 mg via ORAL
  Filled 2020-06-19 (×5): qty 1

## 2020-06-19 MED ORDER — TAMSULOSIN HCL 0.4 MG PO CAPS
0.4000 mg | ORAL_CAPSULE | Freq: Every day | ORAL | Status: DC
  Administered 2020-06-20 – 2020-06-24 (×5): 0.4 mg via ORAL
  Filled 2020-06-19 (×5): qty 1

## 2020-06-19 MED ORDER — HYDRALAZINE HCL 20 MG/ML IJ SOLN
10.0000 mg | Freq: Four times a day (QID) | INTRAMUSCULAR | Status: DC | PRN
  Administered 2020-06-23: 10 mg via INTRAVENOUS
  Filled 2020-06-19: qty 1

## 2020-06-19 MED ORDER — HEPARIN SODIUM (PORCINE) 5000 UNIT/ML IJ SOLN
5000.0000 [IU] | Freq: Three times a day (TID) | INTRAMUSCULAR | Status: DC
  Administered 2020-06-19 – 2020-06-24 (×13): 5000 [IU] via SUBCUTANEOUS
  Filled 2020-06-19 (×15): qty 1

## 2020-06-19 MED ORDER — LACTATED RINGERS IV SOLN: INTRAVENOUS | Status: AC

## 2020-06-19 MED ORDER — QUETIAPINE FUMARATE 25 MG PO TABS
25.0000 mg | ORAL_TABLET | Freq: Every day | ORAL | Status: DC
  Administered 2020-06-19 – 2020-06-23 (×5): 25 mg via ORAL
  Filled 2020-06-19 (×7): qty 1

## 2020-06-19 MED ORDER — SODIUM CHLORIDE 0.9% FLUSH
3.0000 mL | Freq: Two times a day (BID) | INTRAVENOUS | Status: DC
  Administered 2020-06-21 – 2020-06-22 (×3): 3 mL via INTRAVENOUS

## 2020-06-19 MED ORDER — PRAVASTATIN SODIUM 20 MG PO TABS
20.0000 mg | ORAL_TABLET | Freq: Every day | ORAL | Status: DC
  Administered 2020-06-20 – 2020-06-24 (×5): 20 mg via ORAL
  Filled 2020-06-19 (×5): qty 1

## 2020-06-19 NOTE — ED Notes (Signed)
Stuck patient twice for IV access, both unsuccessful. Will make Katie primary RN aware.

## 2020-06-19 NOTE — ED Notes (Signed)
Pt. Aware of urine specimen. Urinal at the bedside. Will collect urine when pt. Voids. Nurse aware.

## 2020-06-19 NOTE — ED Notes (Signed)
Patient transported to MRI 

## 2020-06-19 NOTE — Evaluation (Signed)
Physical Therapy Evaluation Patient Details Name: Edward Stafford MRN: [id number] DOB: 04/05/1951 Today's Date: 06/19/2020   History of Present Illness  69yo male seen in ED after fall at home, reportedly on the floor for ~ 4-5 hrs. pt does not recall falling. in bed for 1-2 days after roommate helped him up. Head CT negative. PMH: HTN, CKD, prostate CA  Clinical Impression  Pt admitted with above diagnosis.  Pt requiring overall min to mod assist of 2 for mobility. He is weaker than his baseline, extremely slow to respond and at risk for additional falls. At baseline pt is independent per family member.   Pt currently with functional limitations due to the deficits listed below (see PT Problem List). Pt will benefit from skilled PT to increase their independence and safety with mobility to allow discharge to the venue listed below.       Follow Up Recommendations SNF;Supervision/Assistance - 24 hour    Equipment Recommendations  None recommended by PT    Recommendations for Other Services       Precautions / Restrictions Precautions Precautions: Fall Restrictions Weight Bearing Restrictions: No      Mobility  Bed Mobility Overal bed mobility: Needs Assistance Bed Mobility: Supine to Sit;Sit to Supine     Supine to sit: Mod assist;+2 for physical assistance;+2 for safety/equipment Sit to supine: Min assist;+2 for safety/equipment;Mod assist   General bed mobility comments: incr time, multi-modal cues for technique and sequencing of task. assist with LEs and trunk to come to sit, assist to lift LEs on to bed  Transfers Overall transfer level: Needs assistance Equipment used: Rolling walker (2 wheeled) Transfers: Sit to/from Stand Sit to Stand: Min assist;+2 safety/equipment;+2 physical assistance         General transfer comment: cues for safety and hand placement. assist to rise and transition to RW  Ambulation/Gait Ambulation/Gait assistance: Min assist;+2  safety/equipment Gait Distance (Feet): 80 Feet Assistive device: Rolling walker (2 wheeled) Gait Pattern/deviations: Decreased stride length;Trunk flexed;Step-through pattern;Decreased step length - right     General Gait Details: assist to balance and maneuver RW.  decr step length on R, decr push off on R. mutli-modal cues for posture, RW position and safety  Stairs            Wheelchair Mobility    Modified Rankin (Stroke Patients Only)       Balance Overall balance assessment: Needs assistance;History of Falls Sitting-balance support: Single extremity supported;Bilateral upper extremity supported;Feet supported Sitting balance-Leahy Scale: Poor Sitting balance - Comments: repeated posterior LOB in sitting. with time and cues able to maintain midline Postural control: Posterior lean Standing balance support: Bilateral upper extremity supported Standing balance-Leahy Scale: Poor Standing balance comment: reliant on UEs and external support                             Pertinent Vitals/Pain Pain Assessment: No/denies pain    Home Living Family/patient expects to be discharged to:: Private residence Living Arrangements: Other (Comment) (roommate)   Type of Home: Apartment Home Access: Level entry     Home Layout: One level Home Equipment: None Additional Comments: pt is extremely slow to respond, has difficulty answering questions. family member provides much of info regarding PLOF/home situation    Prior Function Level of Independence: Independent               Hand Dominance        Extremity/Trunk Assessment  Upper Extremity Assessment Upper Extremity Assessment: Generalized weakness    Lower Extremity Assessment Lower Extremity Assessment: RLE deficits/detail;LLE deficits/detail;Generalized weakness RLE Coordination: decreased gross motor LLE Coordination: decreased gross motor       Communication   Communication: No  difficulties  Cognition Arousal/Alertness: Awake/alert Behavior During Therapy: Flat affect Overall Cognitive Status: Impaired/Different from baseline Area of Impairment: Attention;Following commands;Safety/judgement;Memory;Problem solving                   Current Attention Level: Sustained Memory: Decreased short-term memory Following Commands: Follows one step commands with increased time;Follows multi-step commands inconsistently Safety/Judgement: Decreased awareness of safety;Decreased awareness of deficits   Problem Solving: Slow processing;Decreased initiation;Difficulty sequencing;Requires verbal cues;Requires tactile cues General Comments: family present and reports this is not pt's baseline cognitive status      General Comments      Exercises     Assessment/Plan    PT Assessment Patient needs continued PT services  PT Problem List Decreased strength;Decreased mobility;Decreased safety awareness;Decreased activity tolerance;Decreased range of motion;Decreased balance;Decreased knowledge of use of DME       PT Treatment Interventions DME instruction;Therapeutic exercise;Gait training;Functional mobility training;Therapeutic activities;Patient/family education;Balance training    PT Goals (Current goals can be found in the Care Plan section)  Acute Rehab PT Goals Patient Stated Goal: get stronger PT Goal Formulation: With patient Time For Goal Achievement: 07/03/20 Potential to Achieve Goals: Good    Frequency Min 2X/week   Barriers to discharge        Co-evaluation               AM-PAC PT "6 Clicks" Mobility  Outcome Measure Help needed turning from your back to your side while in a flat bed without using bedrails?: A Lot Help needed moving from lying on your back to sitting on the side of a flat bed without using bedrails?: A Lot Help needed moving to and from a bed to a chair (including a wheelchair)?: A Little Help needed standing up from a  chair using your arms (e.g., wheelchair or bedside chair)?: A Little Help needed to walk in hospital room?: A Little Help needed climbing 3-5 steps with a railing? : A Lot 6 Click Score: 15    End of Session Equipment Utilized During Treatment: Gait belt Activity Tolerance: Patient tolerated treatment well Patient left: in bed;with call bell/phone within reach Nurse Communication: Mobility status PT Visit Diagnosis: Unsteadiness on feet (R26.81);Other abnormalities of gait and mobility (R26.89);History of falling (Z91.81);Difficulty in walking, not elsewhere classified (R26.2)    Time: 8938-1017 PT Time Calculation (min) (ACUTE ONLY): 26 min   Charges:   PT Evaluation $PT Eval Low Complexity: 1 Low PT Treatments $Gait Training: 8-22 mins        Baxter Flattery, PT  Acute Rehab Dept ([place]) [phone] Pager [phone]  06/19/2020   [facility] 06/19/2020, 12:58 PM

## 2020-06-19 NOTE — Progress Notes (Addendum)
   06/19/20 2236  Assess: MEWS Score  Temp 100 F (37.8 C)  BP (!) 131/82  Pulse Rate 77  Resp (!) 26  SpO2 98 %  O2 Device Room Air  Assess: MEWS Score  MEWS Temp 0  MEWS Systolic 0  MEWS Pulse 0  MEWS RR 2  MEWS LOC 1  MEWS Score 3  MEWS Score Color Yellow  Assess: if the MEWS score is Yellow or Red  Were vital signs taken at a resting state? Yes  Focused Assessment No change from prior assessment  Early Detection of Sepsis Score *See Row Information* Low  MEWS guidelines implemented *See Row Information* Yes  Treat  MEWS Interventions Administered scheduled meds/treatments  Pain Scale 0-10  Pain Score 0  Take Vital Signs  Increase Vital Sign Frequency  Yellow: Q 2hr X 2 then Q 4hr X 2, if remains yellow, continue Q 4hrs  Escalate  MEWS: Escalate Yellow: discuss with charge nurse/RN and consider discussing with provider and RRT  Notify: Charge Nurse/RN  Name of Charge Nurse/RN Notified Lesly Rubenstein  Date Charge Nurse/RN Notified 06/19/20  Time Charge Nurse/RN Notified 2242  Notify: Provider  Provider Name/Title Mansley MD  Date Provider Notified 06/19/20  Time Provider Notified 2243  Notification Type Page  Notification Reason Other (Comment) (MEWS now yellow)  Document  Progress note created (see row info) Yes         Edward Stafford 06/19/2020,10:54 PM

## 2020-06-19 NOTE — Progress Notes (Signed)
Received patient from 6th floor, VS obtained, telemetry monitor applied, oriented patient to [facility], call light placed in reach

## 2020-06-19 NOTE — ED Notes (Signed)
Pt said he fell on 7/23 around 12pm. He would like his knees checked out. Pt authorizes [place] to discuss his medication care with his nephew [name] and sister Brunetta Genera. He wants [place] to call his nephew Dr. [name] [phone]. Pt's sister is Brunetta Genera [phone].

## 2020-06-19 NOTE — Progress Notes (Signed)
This note also relates to the following rows which could not be included: ECG Heart Rate - Cannot attach notes to unvalidated device data Resp - Cannot attach notes to unvalidated device data    06/19/20 1816  Provider Notification  Provider Name/Title Neysa Bonito  Date Provider Notified 06/19/20  Time Provider Notified 0982  Notification Type Page  Notification Reason Other (Comment) (Lactic Acid 2.4)  Response Other (Comment) (pending)

## 2020-06-19 NOTE — Progress Notes (Addendum)
   06/19/20 1832  Provider Notification  Provider Name/Title Neysa Bonito  Date Provider Notified 06/19/20  Time Provider Notified [phone]  Notification Type Page  Notification Reason Other (Comment) (lactic acid 2.4)  Response Other (Comment) (pending)   No new orders at this time

## 2020-06-19 NOTE — H&P (Signed)
History and Physical        Hospital Admission Note Date: 06/19/2020  Patient name: Edward Stafford Medical record number: [id number] Date of birth: [birth year] Age: 69 y.o. Gender: male  PCP: Colon Branch, MD  Patient coming from: home Lives with: friend At baseline, ambulates: independently  Chief Complaint    Chief Complaint  Patient presents with  . Fall      HPI:   This is a 69 year old male who is a poor historian with past medical history of chronic back pain, drug abuse, neuropathy, prostate cancer with radioactive seed implantation, recurrent falls, hypertension, hyperlipidemia, CAD s/p stent, former tobacco use, alcohol use reports last drink 1 week ago who presented to [facility] ED on 7/24 with recurrent falls over the past week and difficulty walking.  Patient reports that he had a fall 1 week ago and that he was on the floor for several hours.  Since that time he states that he fell 2 or 3 more times while at home.  Whenever asked about symptoms prior to his fall or what led up to the fall or where he was he simply stated "I fell" but does not provide any further detail.  He currently denies any nausea, vomiting, diarrhea, chest pain, shortness of breath, palpitations, pain anywhere, loss of consciousness or any other issues. He was seen by his PCP, [name], 6/25, who recommended PT and podiatry but the patient declined.   I called the patient's nephew, [name] a radiologist in [place], who provided more information.  States that he is concerned his uncle has been suffering from TIAs and believes his uncle to have been on the floor for 4 hours last week.  States he has been having shuffling gait and has a history of chronic urinary incontinence after his prostate procedure.  Has noticed his uncle's general health declining over the past several months.  States the patient  lives with a roommate and takes care of himself and is unsure if he has been compliant with his medications. States that he does not feel his uncle can take care of himself anymore. Also confused nightly.    ED Course: Vitals unremarkable. CT head with severe chronic small vessel ischemic disease but no acute abnormality. Labs most notable for Cr 2.92 (previously 1.95 in June) and CK 539.   Vitals:   06/19/20 1136 06/19/20 1330  BP: (!) 129/98 (!) 133/92  Pulse: 83 78  Resp: 18   Temp:    SpO2: 100% 100%     Review of Systems:  ROS  Medical/Social/Family History   Past Medical History: Past Medical History:  Diagnosis Date  . Back pain, chronic    disabled due to chronic back pain  . Chest pain    Cardiac cath (-)2008, Myoview (-) 2011  . Chronic kidney disease   . Drug abuse-- crack, marijuana 04/19/2007   hx of  . GERD (gastroesophageal reflux disease)    no current meds  . Glaucoma   . Hyperglycemia   . Hyperlipidemia   . Hypertension   . Prostate cancer ([place]) 06/2018  . SINUS BRADYCARDIA 05/17/2010  . UTI (urinary tract infection)    on doxycycline for  Past Surgical History:  Procedure Laterality Date  . CHOLECYSTECTOMY    . CORONARY ANGIOPLASTY WITH STENT PLACEMENT    . PROSTATE BIOPSY    . RADIOACTIVE SEED IMPLANT N/A 11/14/2018   Procedure: RADIOACTIVE SEED IMPLANT/BRACHYTHERAPY IMPLANT;  Surgeon: Ardis Hughs, MD;  Location: [facility];  Service: Urology;  Laterality: N/A;  . SPACE OAR INSTILLATION N/A 11/14/2018   Procedure: SPACE OAR INSTILLATION;  Surgeon: Ardis Hughs, MD;  Location: [facility];  Service: Urology;  Laterality: N/A;    Medications: Prior to Admission medications   Medication Sig Start Date End Date Taking? Authorizing Provider  aspirin EC 81 MG tablet Take 81 mg by mouth daily.   Yes [provider]  losartan (COZAAR) 25 MG tablet Take 1 tablet (25 mg total) by mouth every  evening. 05/24/20  Yes Paz, Alda Berthold, MD  tamsulosin (FLOMAX) 0.4 MG CAPS capsule Take 0.4 mg by mouth daily. 12/08/19  Yes [provider]  hydrochlorothiazide (HYDRODIURIL) 25 MG tablet Take 1 tablet (25 mg total) by mouth daily. 05/24/20   Colon Branch, MD  pravastatin (PRAVACHOL) 20 MG tablet Take 1 tablet (20 mg total) by mouth daily. 05/24/20   Colon Branch, MD    Allergies:  No Known Allergies  Social History:  reports that he quit smoking about 7 years ago. His smoking use included cigarettes. He has a 7.50 pack-year smoking history. He has never used smokeless tobacco. He reports previous drug use. He reports that he does not drink alcohol.  Family History: Family History  Problem Relation Age of Onset  . CAD Mother        age onset ?  . Colon cancer Other        cousin  . Stroke Neg Hx   . Prostate cancer Neg Hx   . Diabetes Neg Hx   . Breast cancer Neg Hx   . Pancreatic cancer Neg Hx      Objective   Physical Exam: Blood pressure (!) 133/92, pulse 78, temperature 98.4 F (36.9 C), temperature source Oral, resp. rate 18, height 6' (1.829 m), weight (!) 111.1 kg, SpO2 100 %.  Physical Exam Vitals and nursing note reviewed.  Constitutional:      General: He is not in acute distress. HENT:     Head: Normocephalic.     Mouth/Throat:     Mouth: Mucous membranes are moist.  Eyes:     Conjunctiva/sclera: Conjunctivae normal.  Cardiovascular:     Rate and Rhythm: Normal rate and regular rhythm.  Pulmonary:     Effort: Pulmonary effort is normal.     Breath sounds: Normal breath sounds.  Abdominal:     General: Abdomen is flat. There is no distension.  Musculoskeletal:        General: No swelling or tenderness.  Skin:    Coloration: Skin is not jaundiced.  Neurological:     Mental Status: He is alert.     Comments: Leaning to left side  Negative pronator drift Cranial nerves intact though possible weakness on the left CN XI  Psychiatric:        Mood and  Affect: Mood normal.     LABS on Admission: I have personally reviewed all the labs and imaging below    Basic Metabolic Panel: Recent Labs  Lab 06/19/20 0744  NA 136  K 3.7  CL 97*  CO2 21*  GLUCOSE 122*  BUN 32*  CREATININE 2.92*  CALCIUM  9.3   Liver Function Tests: Recent Labs  Lab 06/19/20 0744  AST 41  ALT 28  ALKPHOS 85  BILITOT 1.3*  PROT 8.3*  ALBUMIN 4.2   No results for input(s): LIPASE, AMYLASE in the last 168 hours. No results for input(s): AMMONIA in the last 168 hours. CBC: Recent Labs  Lab 06/19/20 0744  WBC 6.7  NEUTROABS 5.8  HGB 15.1  HCT 47.0  MCV 89.4  PLT 296   Cardiac Enzymes: Recent Labs  Lab 06/19/20 0811  CKTOTAL 539*   BNP: Invalid input(s): POCBNP CBG: No results for input(s): GLUCAP in the last 168 hours.  Radiological Exams on Admission:  CT Head Wo Contrast  Result Date: 06/19/2020 CLINICAL DATA:  Weakness status post fall. EXAM: CT HEAD WITHOUT CONTRAST TECHNIQUE: Contiguous axial images were obtained from the base of the skull through the vertex without intravenous contrast. COMPARISON:  04/17/2012 FINDINGS: Brain: There is no evidence of acute infarct, intracranial hemorrhage, mass, midline shift, or extra-axial fluid collection. Confluent hypodensities in the cerebral white matter bilaterally are nonspecific but compatible with severe chronic small vessel ischemic disease, stable to slightly progressed from the prior CT. There is a chronic lacunar infarct in the right thalamus. Vascular: Calcified atherosclerosis at the skull base. No hyperdense vessel. Skull: No fracture or suspicious osseous lesion. Sinuses/Orbits: Visualized paranasal sinuses and mastoid air cells are clear. Unremarkable orbits. Other: None. IMPRESSION: 1. No evidence of acute intracranial abnormality. 2. Severe chronic small vessel ischemic disease. Electronically Signed   By: Logan Bores M.D.   On: 06/19/2020 08:40      EKG: Not done   A & P    Principal Problem:   Fall at home, initial encounter Active Problems:   HYPERLIPIDEMIA, MIXED   HTN (hypertension)   SINUS BRADYCARDIA   AKI (acute kidney injury) ([place])   1. Recurrent falls, unknown etiology  a. wide differential: debility, shuffling gait, peripheral neuropathy, possible stroke, history of sinus bradycardia (now possibly symptomatic), other b. CT head with severe chronic ischemic changes but no acute abnormality c. RPR negative in 2011 d. PT recommending SNF e. Check vitamin B12 level f. MRI brain g. SW consult h. EKG and tele  2. AKI on CKD 3a a. Could be multifactorial from rhabdomyolysis after having recurrent falls this week and stuck on the floor along with suspected poor oral intake and taking HCTZ and losartan b. LR maintenance c. Hold losartan and HCTZ  3. Leaning to left side concerning for neurologic deficit a. MRI brain  4. Mild rhabdomyolysis a. CK in 500s though this could be the tail end of rhabdomyolysis that may have occurred last week b. IV fluids  5. Anion gap metabolic acidosis of unknown etiology a. AG 18 b. Could be starvation ketosis or lactic acidosis c. Check serum ketones d. Check lactic acid  6. Hypertension a. Hydralazine as needed b. Holding losartan and HCTZ  7. Hyperlipidemia a. Lipid panel b. Continue statin  8. History of sinus bradycardia a. Could have had a syncopal episode from symptomatic bradycardia b. EKG and tele  9. Concern for dementia and sundowning a. Nephew reports he has had significant decline in the past several months and is unlikely to be able to take care of himself b. SW consult c. Dietary consult d. Seroquel nightly  10. Failure to thrive a. Plan as above  11. Social issues a. As above   DVT prophylaxis: heparin   Code Status: Prior  Diet: heart healthy Family Communication:  Admission, patients condition and plan of care including tests being ordered have been discussed with the  patient who indicates understanding and agrees with the plan and Code Status. Patient's nephew was updated  Disposition Plan: The appropriate patient status for this patient is OBSERVATION. Observation status is judged to be reasonable and necessary in order to provide the required intensity of service to ensure the patient's safety. The patient's presenting symptoms, physical exam findings, and initial radiographic and laboratory data in the context of their medical condition is felt to place them at decreased risk for further clinical deterioration. Furthermore, it is anticipated that the patient will be medically stable for discharge from the hospital within 2 midnights of admission. The following factors support the patient status of observation.   " The patient's presenting symptoms include recurrent falls. " The physical exam findings include leaning to the left side. " The initial radiographic and laboratory data are AKI.    Status is: Observation  The patient remains OBS appropriate and will d/c before 2 midnights.  Dispo: The patient is from: Home              Anticipated d/c is to: SNF              Anticipated d/c date is: 2 days              Patient currently is not medically stable to d/c.    Consultants  . None  Procedures  . None  Time Spent on Admission: 67 minutes    Harold Hedge, DO [facility] Pager [phone] 06/19/2020, 2:02 PM

## 2020-06-19 NOTE — Progress Notes (Signed)
Report given to Kim

## 2020-06-19 NOTE — ED Notes (Signed)
Pt eating and drinking

## 2020-06-19 NOTE — Progress Notes (Signed)
Called 4 west to give report on patient. Nurse unable to come to the phone, will call back in a few minutes.

## 2020-06-19 NOTE — ED Provider Notes (Signed)
[place] DEPT Provider Note   CSN: [id number] Arrival date & time: 06/18/20  2038     History Chief Complaint  Patient presents with  . Fall    Edward Stafford is a 69 y.o. male.  HPI He presents for evaluation of falling several days ago and having trouble getting off the ground after that.  He states his gait has been difficult for a while, but he feels that he does not need physical therapy.  He denies fever, chills, cough, shortness of breath, nausea or vomiting.  He is taking his usual medications, without relief.  No known modifying factors.    Past Medical History:  Diagnosis Date  . Back pain, chronic    disabled due to chronic back pain  . Chest pain    Cardiac cath (-)2008, Myoview (-) 2011  . Chronic kidney disease   . Drug abuse-- crack, marijuana 04/19/2007   hx of  . GERD (gastroesophageal reflux disease)    no current meds  . Glaucoma   . Hyperglycemia   . Hyperlipidemia   . Hypertension   . Prostate cancer ([place]) 06/2018  . SINUS BRADYCARDIA 05/17/2010  . UTI (urinary tract infection)    on doxycycline for    Patient Active Problem List   Diagnosis Date Noted  . Prostate cancer ([place]) 08/01/2018  . Elevated PSA 05/13/2018  . PCP NOTES >>> 09/01/2015  . Annual physical exam 07/15/2014  . BMI 38.0-38.9,adult 04/16/2014  . Chronic kidney disease 04/17/2012  . Dizziness 04/16/2012  . CORONARY ARTERY DISEASE ? 05/17/2010  . SINUS BRADYCARDIA 05/17/2010  . GERD 01/19/2010  . Hyperglycemia 06/08/2008  . Glaucoma 12/19/2007  . HYPERLIPIDEMIA, MIXED 04/19/2007  . Drug abuse-- crack, marijuana 04/19/2007  . HTN (hypertension) 04/19/2007    Past Surgical History:  Procedure Laterality Date  . CHOLECYSTECTOMY    . CORONARY ANGIOPLASTY WITH STENT PLACEMENT    . PROSTATE BIOPSY    . RADIOACTIVE SEED IMPLANT N/A 11/14/2018   Procedure: RADIOACTIVE SEED IMPLANT/BRACHYTHERAPY IMPLANT;  Surgeon: Ardis Hughs, MD;   Location: [facility];  Service: Urology;  Laterality: N/A;  . SPACE OAR INSTILLATION N/A 11/14/2018   Procedure: SPACE OAR INSTILLATION;  Surgeon: Ardis Hughs, MD;  Location: [facility];  Service: Urology;  Laterality: N/A;       Family History  Problem Relation Age of Onset  . CAD Mother        age onset ?  . Colon cancer Other        cousin  . Stroke Neg Hx   . Prostate cancer Neg Hx   . Diabetes Neg Hx   . Breast cancer Neg Hx   . Pancreatic cancer Neg Hx     Social History   Tobacco Use  . Smoking status: Former Smoker    Packs/day: 0.50    Years: 15.00    Pack years: 7.50    Types: Cigarettes    Quit date: 06/15/2013    Years since quitting: 7.0  . Smokeless tobacco: Never Used  . Tobacco comment: never heavy smoker   Vaping Use  . Vaping Use: Never used  Substance Use Topics  . Alcohol use: No    Comment: none  . Drug use: Not Currently    Comment: denies since ~ 2014, h/o marijuana-crack    Home Medications Prior to Admission medications   Medication Sig Start Date End Date Taking? Authorizing Provider  aspirin EC 81 MG  tablet Take 81 mg by mouth daily.   Yes [provider]  losartan (COZAAR) 25 MG tablet Take 1 tablet (25 mg total) by mouth every evening. 05/24/20  Yes Paz, Alda Berthold, MD  tamsulosin (FLOMAX) 0.4 MG CAPS capsule Take 0.4 mg by mouth daily. 12/08/19  Yes [provider]  hydrochlorothiazide (HYDRODIURIL) 25 MG tablet Take 1 tablet (25 mg total) by mouth daily. 05/24/20   Colon Branch, MD  pravastatin (PRAVACHOL) 20 MG tablet Take 1 tablet (20 mg total) by mouth daily. 05/24/20   Colon Branch, MD    Allergies    Patient has no known allergies.  Review of Systems   Review of Systems  All other systems reviewed and are negative.   Physical Exam Updated Vital Signs BP (!) 129/98 (BP Location: Right Arm)   Pulse 83   Temp 98.4 F (36.9 C) (Oral)   Resp 18   Ht 6' (1.829 m)   Wt (!)  111.1 kg   SpO2 100%   BMI 33.23 kg/m   Physical Exam Vitals and nursing note reviewed.  Constitutional:      General: He is not in acute distress.    Appearance: He is well-developed. He is not ill-appearing, toxic-appearing or diaphoretic.  HENT:     Head: Normocephalic and atraumatic.     Right Ear: External ear normal.     Left Ear: External ear normal.  Eyes:     Conjunctiva/sclera: Conjunctivae normal.     Pupils: Pupils are equal, round, and reactive to light.  Neck:     Trachea: Phonation normal.  Cardiovascular:     Rate and Rhythm: Normal rate and regular rhythm.     Heart sounds: Normal heart sounds.  Pulmonary:     Effort: Pulmonary effort is normal.     Breath sounds: Normal breath sounds.  Abdominal:     General: There is no distension.     Palpations: Abdomen is soft.     Tenderness: There is no abdominal tenderness.  Musculoskeletal:        General: No swelling, tenderness, deformity or signs of injury. Normal range of motion.     Cervical back: Normal range of motion and neck supple.     Comments: Antalgic gait, limps on the right, strength 5 out of 5, bilaterally.  Skin:    General: Skin is warm and dry.  Neurological:     Mental Status: He is alert and oriented to person, place, and time.     Cranial Nerves: No cranial nerve deficit.     Sensory: No sensory deficit.     Motor: No abnormal muscle tone.     Coordination: Coordination normal.  Psychiatric:        Mood and Affect: Mood normal.        Behavior: Behavior normal.        Thought Content: Thought content normal.        Judgment: Judgment normal.     ED Results / Procedures / Treatments   Labs (all labs ordered are listed, but only abnormal results are displayed) Labs Reviewed  COMPREHENSIVE METABOLIC PANEL - Abnormal; Notable for the following components:      Result Value   Chloride 97 (*)    CO2 21 (*)    Glucose, Bld 122 (*)    BUN 32 (*)    Creatinine, Ser 2.92 (*)    Total  Protein 8.3 (*)    Total Bilirubin 1.3 (*)  GFR calc non Af Amer 21 (*)    GFR calc Af Amer 24 (*)    Anion gap 18 (*)    All other components within normal limits  CBC WITH DIFFERENTIAL/PLATELET - Abnormal; Notable for the following components:   Lymphs Abs 0.3 (*)    All other components within normal limits  CK - Abnormal; Notable for the following components:   Total CK 539 (*)    All other components within normal limits  SARS CORONAVIRUS 2 BY RT PCR (HOSPITAL ORDER, [facility])  URINALYSIS, ROUTINE W REFLEX MICROSCOPIC    EKG None  Radiology CT Head Wo Contrast  Result Date: 06/19/2020 CLINICAL DATA:  Weakness status post fall. EXAM: CT HEAD WITHOUT CONTRAST TECHNIQUE: Contiguous axial images were obtained from the base of the skull through the vertex without intravenous contrast. COMPARISON:  04/17/2012 FINDINGS: Brain: There is no evidence of acute infarct, intracranial hemorrhage, mass, midline shift, or extra-axial fluid collection. Confluent hypodensities in the cerebral white matter bilaterally are nonspecific but compatible with severe chronic small vessel ischemic disease, stable to slightly progressed from the prior CT. There is a chronic lacunar infarct in the right thalamus. Vascular: Calcified atherosclerosis at the skull base. No hyperdense vessel. Skull: No fracture or suspicious osseous lesion. Sinuses/Orbits: Visualized paranasal sinuses and mastoid air cells are clear. Unremarkable orbits. Other: None. IMPRESSION: 1. No evidence of acute intracranial abnormality. 2. Severe chronic small vessel ischemic disease. Electronically Signed   By: Logan Bores M.D.   On: 06/19/2020 08:40    Procedures .Critical Care Performed by: Daleen Bo, MD Authorized by: Daleen Bo, MD   Critical care provider statement:    Critical care time (minutes):  35   Critical care start time:  06/19/2020 7:20 AM   Critical care end time:  06/19/2020 12:49  PM   Critical care time was exclusive of:  Separately billable procedures and treating other patients   Critical care was necessary to treat or prevent imminent or life-threatening deterioration of the following conditions:  Metabolic crisis   Critical care was time spent personally by me on the following activities:  Blood draw for specimens, development of treatment plan with patient or surrogate, discussions with consultants, evaluation of patient's response to treatment, examination of patient, obtaining history from patient or surrogate, ordering and performing treatments and interventions, ordering and review of laboratory studies, pulse oximetry, re-evaluation of patient's condition, review of old charts and ordering and review of radiographic studies   (including critical care time)  Medications Ordered in ED Medications  sodium chloride 0.9 % bolus 500 mL (has no administration in time range)    ED Course  I have reviewed the triage vital signs and the nursing notes.  Pertinent labs & imaging results that were available during my care of the patient were reviewed by me and considered in my medical decision making (see chart for details).  Clinical Course as of Jun 19 1258  Sun Jun 19, 2020  1113 Mild elevation   [EW]  1113 Normal except chloride low, CO2 low, glucose high, BUN high, creatinine high, total bilirubin high, GFR low, anion gap high  Comprehensive metabolic panel(!) [EW]  3536 Normal  CBC with Differential(!) [EW]  1114 Per radiology report, no acute abnormalities, severe chronic small vessel disease abnormality is present.  CT Head Wo Contrast [EW]  1116 Platelets: 296 [EW]  1443 Per PR- recommending SNF for him. He needed mod assist to get OOB, was  able tamb ~ 55' with RW and min assist but not safe to go home alone at his current status.   [EW]    Clinical Course User Index [EW] Daleen Bo, MD   MDM Rules/Calculators/A&P                            Patient Vitals for the past 24 hrs:  BP Temp Temp src Pulse Resp SpO2 Height Weight  06/19/20 1136 (!) 129/98 -- -- 83 18 100 % -- --  06/19/20 1030 (!) 114/90 -- -- 89 -- 100 % -- --  06/19/20 1015 (!) 121/86 -- -- 84 -- 97 % -- --  06/19/20 0945 (!) 117/103 -- -- 86 -- 96 % -- --  06/19/20 0931 (!) 124/95 -- -- -- -- -- -- --  06/19/20 0930 -- -- -- 83 -- 98 % -- --  06/19/20 0915 (!) 125/100 -- -- 91 -- 91 % -- --  06/19/20 0900 (!) 134/97 -- -- 94 -- 96 % -- --  06/19/20 0853 (!) 138/106 -- -- 94 17 97 % -- --  06/18/20 2049 (!) 138/103 98.4 F (36.9 C) Oral (!) 109 18 97 % 6' (1.829 m) (!) 111.1 kg    12:59 PM Reevaluation with update and discussion. After initial assessment and treatment, an updated evaluation reveals he is alert and states that he agrees to admit.  Findings discussed with the patient all questions were answered. Daleen Bo   Medical Decision Making:  This patient is presenting for evaluation of falling with difficulty getting up 2 days ago, he came here by private vehicle.  He is able to ambulate in the emergency department, which does require a range of treatment options, and is a complaint that involves a high risk of morbidity and mortality. The differential diagnoses include CVA, metabolic instability, injuries from fall. I decided to review old records, and in summary patient with chronic difficulty walking, previously evaluated by PCP, and referred to PT however patient declined.  Symptoms are apparently aggravated by a fall couple days ago with difficulty getting up.  He presents alert and cooperative.  He has history of cocaine abuse.  No prior documented stroke.  He has kidney disease coronary artery disease and hyperglycemia..  I did not require additional historical information from anyone.  Clinical Laboratory Tests Ordered, included CBC, Metabolic panel and Urinalysis. Review indicates elevated CK consistent with rhabdomyolysis from trauma.   Metabolic abnormalities consistent with line depletion include: Low chloride, low CO2, elevated BUN, elevated creatinine.  Total bilirubin elevated. Radiologic Tests Ordered, included CT head.  I independently Visualized: Radiographic images, which show no CVA, chronic small vessel disease, severe.    Critical Interventions-clinical evaluation, laboratory testing, CT head, observation, PT consultation, reassessment  After These Interventions, the Patient was reevaluated and was found to require hospitalization for treatment of AKI, and difficulty walking.  Incidental mild CK elevation, likely traumatic related.  AKI likely secondary to use of diuretic therapy, and possibly ARB treatment.  CRITICAL CARE-yes Performed by: Daleen Bo  Nursing Notes Reviewed/ Care Coordinated Applicable Imaging Reviewed Interpretation of Laboratory Data incorporated into ED treatment  12:50 PM-Consult complete with hospitalist. Patient case explained and discussed.  He agrees to admit.  Call ended at 12:58 PM  Plan: Admit    Final Clinical Impression(s) / ED Diagnoses Final diagnoses:  AKI (acute kidney injury) ([place])  Fall, initial encounter  Weakness    Rx /  DC Orders ED Discharge Orders    None       Daleen Bo, MD 06/19/20 1259

## 2020-06-19 NOTE — Progress Notes (Addendum)
Patient arrived on the unit with cardiac monitoring orders. Dr. Neysa Bonito called and informed We don't have cardiac monitoring on this unit. Mr Shad will be transferred to 1422. Charge nurse will call me back for report.

## 2020-06-20 ENCOUNTER — Inpatient Hospital Stay (HOSPITAL_COMMUNITY): Payer: Medicare Other

## 2020-06-20 DIAGNOSIS — N261 Atrophy of kidney (terminal): Secondary | ICD-10-CM | POA: Diagnosis not present

## 2020-06-20 DIAGNOSIS — W19XXXA Unspecified fall, initial encounter: Secondary | ICD-10-CM | POA: Diagnosis not present

## 2020-06-20 DIAGNOSIS — F0150 Vascular dementia without behavioral disturbance: Secondary | ICD-10-CM | POA: Diagnosis present

## 2020-06-20 DIAGNOSIS — M6281 Muscle weakness (generalized): Secondary | ICD-10-CM | POA: Diagnosis not present

## 2020-06-20 DIAGNOSIS — M6282 Rhabdomyolysis: Secondary | ICD-10-CM | POA: Diagnosis not present

## 2020-06-20 DIAGNOSIS — I10 Essential (primary) hypertension: Secondary | ICD-10-CM | POA: Diagnosis not present

## 2020-06-20 DIAGNOSIS — I499 Cardiac arrhythmia, unspecified: Secondary | ICD-10-CM | POA: Diagnosis not present

## 2020-06-20 DIAGNOSIS — E440 Moderate protein-calorie malnutrition: Secondary | ICD-10-CM | POA: Diagnosis not present

## 2020-06-20 DIAGNOSIS — N1831 Chronic kidney disease, stage 3a: Secondary | ICD-10-CM | POA: Diagnosis not present

## 2020-06-20 DIAGNOSIS — E872 Acidosis: Secondary | ICD-10-CM | POA: Diagnosis not present

## 2020-06-20 DIAGNOSIS — E876 Hypokalemia: Secondary | ICD-10-CM

## 2020-06-20 DIAGNOSIS — R2681 Unsteadiness on feet: Secondary | ICD-10-CM | POA: Diagnosis not present

## 2020-06-20 DIAGNOSIS — D631 Anemia in chronic kidney disease: Secondary | ICD-10-CM | POA: Diagnosis present

## 2020-06-20 DIAGNOSIS — E8729 Other acidosis: Secondary | ICD-10-CM | POA: Diagnosis present

## 2020-06-20 DIAGNOSIS — D519 Vitamin B12 deficiency anemia, unspecified: Secondary | ICD-10-CM | POA: Diagnosis not present

## 2020-06-20 DIAGNOSIS — Z7401 Bed confinement status: Secondary | ICD-10-CM | POA: Diagnosis not present

## 2020-06-20 DIAGNOSIS — H409 Unspecified glaucoma: Secondary | ICD-10-CM | POA: Diagnosis not present

## 2020-06-20 DIAGNOSIS — G629 Polyneuropathy, unspecified: Secondary | ICD-10-CM | POA: Diagnosis not present

## 2020-06-20 DIAGNOSIS — R279 Unspecified lack of coordination: Secondary | ICD-10-CM | POA: Diagnosis not present

## 2020-06-20 DIAGNOSIS — R2689 Other abnormalities of gait and mobility: Secondary | ICD-10-CM | POA: Diagnosis present

## 2020-06-20 DIAGNOSIS — W19XXXD Unspecified fall, subsequent encounter: Secondary | ICD-10-CM | POA: Diagnosis not present

## 2020-06-20 DIAGNOSIS — N400 Enlarged prostate without lower urinary tract symptoms: Secondary | ICD-10-CM

## 2020-06-20 DIAGNOSIS — I9589 Other hypotension: Secondary | ICD-10-CM

## 2020-06-20 DIAGNOSIS — R4189 Other symptoms and signs involving cognitive functions and awareness: Secondary | ICD-10-CM | POA: Diagnosis present

## 2020-06-20 DIAGNOSIS — I129 Hypertensive chronic kidney disease with stage 1 through stage 4 chronic kidney disease, or unspecified chronic kidney disease: Secondary | ICD-10-CM | POA: Diagnosis present

## 2020-06-20 DIAGNOSIS — E861 Hypovolemia: Secondary | ICD-10-CM

## 2020-06-20 DIAGNOSIS — I959 Hypotension, unspecified: Secondary | ICD-10-CM

## 2020-06-20 DIAGNOSIS — R531 Weakness: Secondary | ICD-10-CM | POA: Diagnosis present

## 2020-06-20 DIAGNOSIS — E538 Deficiency of other specified B group vitamins: Secondary | ICD-10-CM

## 2020-06-20 DIAGNOSIS — G20 Parkinson's disease: Secondary | ICD-10-CM | POA: Diagnosis present

## 2020-06-20 DIAGNOSIS — R000 Tachycardia, unspecified: Secondary | ICD-10-CM | POA: Diagnosis present

## 2020-06-20 DIAGNOSIS — N183 Chronic kidney disease, stage 3 unspecified: Secondary | ICD-10-CM | POA: Diagnosis not present

## 2020-06-20 DIAGNOSIS — D709 Neutropenia, unspecified: Secondary | ICD-10-CM | POA: Diagnosis not present

## 2020-06-20 DIAGNOSIS — R296 Repeated falls: Secondary | ICD-10-CM | POA: Diagnosis not present

## 2020-06-20 DIAGNOSIS — E782 Mixed hyperlipidemia: Secondary | ICD-10-CM | POA: Diagnosis present

## 2020-06-20 DIAGNOSIS — N179 Acute kidney failure, unspecified: Secondary | ICD-10-CM | POA: Diagnosis not present

## 2020-06-20 DIAGNOSIS — Z743 Need for continuous supervision: Secondary | ICD-10-CM | POA: Diagnosis not present

## 2020-06-20 DIAGNOSIS — Y92009 Unspecified place in unspecified non-institutional (private) residence as the place of occurrence of the external cause: Secondary | ICD-10-CM | POA: Diagnosis not present

## 2020-06-20 DIAGNOSIS — N189 Chronic kidney disease, unspecified: Secondary | ICD-10-CM

## 2020-06-20 DIAGNOSIS — M2550 Pain in unspecified joint: Secondary | ICD-10-CM | POA: Diagnosis not present

## 2020-06-20 DIAGNOSIS — F0280 Dementia in other diseases classified elsewhere without behavioral disturbance: Secondary | ICD-10-CM | POA: Diagnosis present

## 2020-06-20 DIAGNOSIS — N3289 Other specified disorders of bladder: Secondary | ICD-10-CM | POA: Diagnosis present

## 2020-06-20 DIAGNOSIS — G3184 Mild cognitive impairment, so stated: Secondary | ICD-10-CM | POA: Diagnosis not present

## 2020-06-20 DIAGNOSIS — Z20822 Contact with and (suspected) exposure to covid-19: Secondary | ICD-10-CM | POA: Diagnosis not present

## 2020-06-20 DIAGNOSIS — E860 Dehydration: Secondary | ICD-10-CM | POA: Diagnosis present

## 2020-06-20 LAB — BASIC METABOLIC PANEL
Anion gap: 11 (ref 5–15)
BUN: 39 mg/dL — ABNORMAL HIGH (ref 8–23)
CO2: 25 mmol/L (ref 22–32)
Calcium: 8.4 mg/dL — ABNORMAL LOW (ref 8.9–10.3)
Chloride: 99 mmol/L (ref 98–111)
Creatinine, Ser: 2.49 mg/dL — ABNORMAL HIGH (ref 0.61–1.24)
GFR calc Af Amer: 30 mL/min — ABNORMAL LOW (ref >60)
GFR calc non Af Amer: 26 mL/min — ABNORMAL LOW (ref >60)
Glucose, Bld: 90 mg/dL (ref 70–99)
Potassium: 3.4 mmol/L — ABNORMAL LOW (ref 3.5–5.1)
Sodium: 135 mmol/L (ref 135–145)

## 2020-06-20 LAB — CBC
HCT: 38.2 % — ABNORMAL LOW (ref 39.0–52.0)
HCT: 36.2 % — ABNORMAL LOW (ref 39.0–52.0)
Hemoglobin: 12.7 g/dL — ABNORMAL LOW (ref 13.0–17.0)
Hemoglobin: 12 g/dL — ABNORMAL LOW (ref 13.0–17.0)
MCH: 29.3 pg (ref 26.0–34.0)
MCH: 29.1 pg (ref 26.0–34.0)
MCHC: 33.2 g/dL (ref 30.0–36.0)
MCHC: 33.1 g/dL (ref 30.0–36.0)
MCV: 88 fL (ref 80.0–100.0)
MCV: 87.7 fL (ref 80.0–100.0)
Platelets: 252 10*3/uL (ref 150–400)
Platelets: 209 10*3/uL (ref 150–400)
RBC: 4.34 MIL/uL (ref 4.22–5.81)
RBC: 4.13 MIL/uL — ABNORMAL LOW (ref 4.22–5.81)
RDW: 13.2 % (ref 11.5–15.5)
RDW: 13.2 % (ref 11.5–15.5)
WBC: 5.1 10*3/uL (ref 4.0–10.5)
WBC: 4.2 10*3/uL (ref 4.0–10.5)
nRBC: 0 % (ref 0.0–0.2)
nRBC: 0 % (ref 0.0–0.2)

## 2020-06-20 LAB — HIV ANTIBODY (ROUTINE TESTING W REFLEX): HIV Screen 4th Generation wRfx: NONREACTIVE

## 2020-06-20 LAB — TSH: TSH: 0.888 u[IU]/mL (ref 0.350–4.500)

## 2020-06-20 LAB — MAGNESIUM: Magnesium: 2.6 mg/dL — ABNORMAL HIGH (ref 1.7–2.4)

## 2020-06-20 LAB — CK: Total CK: 453 U/L — ABNORMAL HIGH (ref 49–397)

## 2020-06-20 MED ORDER — ENSURE ENLIVE PO LIQD
237.0000 mL | Freq: Three times a day (TID) | ORAL | Status: DC
  Administered 2020-06-21 – 2020-06-24 (×6): 237 mL via ORAL

## 2020-06-20 MED ORDER — LACTATED RINGERS IV SOLN: INTRAVENOUS | Status: AC

## 2020-06-20 MED ORDER — CYANOCOBALAMIN 1000 MCG/ML IJ SOLN
1000.0000 ug | Freq: Every day | INTRAMUSCULAR | Status: DC
  Administered 2020-06-20 – 2020-06-24 (×5): 1000 ug via INTRAMUSCULAR
  Filled 2020-06-20 (×6): qty 1

## 2020-06-20 MED ORDER — ADULT MULTIVITAMIN W/MINERALS CH
1.0000 | ORAL_TABLET | Freq: Every day | ORAL | Status: DC
  Administered 2020-06-21 – 2020-06-24 (×4): 1 via ORAL
  Filled 2020-06-20 (×4): qty 1

## 2020-06-20 MED ORDER — POTASSIUM CHLORIDE 20 MEQ PO PACK
40.0000 meq | PACK | ORAL | Status: AC
  Administered 2020-06-20 (×2): 40 meq via ORAL
  Filled 2020-06-20 (×2): qty 2

## 2020-06-20 NOTE — Progress Notes (Signed)
[facility]  PROGRESS NOTE  Edward Stafford KDT:[id number] DOB: 1950-11-30 DOA: 06/19/2020 PCP: Colon Branch, MD Admit date - 06/19/2020   Admitting Physician Harold Hedge, MD  Outpatient Primary MD for the patient is Colon Branch, MD  LOS - 0 Brief Narrative   Edward Stafford is a 69 y.o. year old male with medical history significant for prostate cancer (dx'd 09/2018 with radioactive seed implantation, HTN, HLD, CAD status post stent, alcohol use, chronic back pain, drug abuse (cocaine, marijuana), who presented on 06/19/2020 with reports of recurrent falls over the past week and difficulty walking.  In the ED T-max 98.4, heart rate 109, blood pressure 138/103.  Lab work notable for proteinuria (100), small amount of hemoglobin in UA, Covid test negative, CK 539, creatinine 2.92 (baseline 1.4), BUN 32, CO2 21, anion gap 18. CT head with no acute etiology, showed severe chronic small vessel ischemic disease. Patient was admitted to [facility] service for further work-up of recurrent falls, AKI on CKD, mild rhabdomyolysis anion gap metabolic acidosis  In work-up of recurrent falls found to have B12 191.  MRI brain showed no acute disease but did show chronic ischemia and atrophy.  Patient has not had any arrhythmias on telemetry monitoring.  Suspect his recurrent falls related to shuffling gait/peripheral neuropathy from symptomatic B12 deficiency.  Found to have mild lactic acidosis that resolved with IV fluids likely contributing to anion gap metabolic acidosis.  Subjective  Today has no complaints.  Flat affect.  Only able to tell me "that he slept on the floor on Saturday"  A & P   Recurrent falls, suspect  symptomatic B12 deficiency affecting gait in setting of possibly undiagnosed dementia given atrophy on MRI scan (questionable vascular dementia or Parkinson's with shuffling gait),.  Orthostatic vitals currently negative, however patient is tachycardic and has relative hypotension,  could be related to diminished oral intake in setting of potential cognitive impairment likely been acutely worsened while taking home BP meds, and family reporting patient had not eaten at least 48 hours after being down for quite some period of time. -IM B12 daily x7 days -TSH -PT recommends SNF -IV fluids, supportive care  Symptomatic B12 deficiency.  Patient with shuffling gait, peripheral neuropathy, cognitive impairment and recurrent falls.  B12 191.  No hematologic abnormalities.  -Daily IM B12 x7 days  Cognitive impairment, high concern for dementia, possibly vascular given MRI imaging.  Nephew report significant decline over the past several months prior to admission. -Delirium precautions, high risk for sundowning, Seroquel nightly -PT recommends SNF, social worker consulted  Relative hypotension and tachycardia.  Current blood pressure 109/79, with heart rate of 110.  Suspect related to potential dehydration.  Also on differential thyroid disease.  T-max of 100 on admission, currently 99.5, no leukocytosis/leukopenia and has no localizing signs or symptoms of infection -Continue IV fluids -TSH -Blood cultures if becomes febrile -Hold home HCTZ, losartan  AKI on CKD stage IIIa, improving baseline creatinine of 1.4-1.5, 2.92 on admission.  Suspect prerenal etiology (current tachycardia, mild hypotension), though does note some proteinuria on UA and elevated CK need to rule out post renal/intrarenal, patient does also have history of BPH and could be at risk for urinary retention -Hold home losartan -Close monitor ins and outs -Bladder scan, as needed in and out catheterization if  greater than 250 cc on PVR -Renal ultrasound -Continue IV fluids -Avoid nephrotoxins  Rhabdomyolysis, improving.  In setting of fall.  Has no muscle pain.  CK already downtrending -Continue IV fluids  Hypokalemia.  No current GI losses. -Replete orally-check magnesium -Monitor BMP  Normocytic  anemia, new.  Previous baseline hemoglobin 15, 12.7 this a.m.  Could be dilution related to IV fluids.  No signs or symptoms of bleeding. -Repeat CBC this afternoon  BPH, seems stable. -Monitor intake and output -Bladder scan, as needed in and out catheterization if greater than 250 cc on PVR -Follow-up renal ultrasound -Continue home Flomax  History of hypertension.  Blood pressure running a bit low.  Most recent SBP's 109 -Continue to hold home HCTZ/losartan  Anion gap metabolic acidosis secondary to lactic acidosis, resolved  Hyperlipidemia, stable -continue statin  History of bradycardia.  I reviewed telemetry has remained in normal sinus rhythm and normal rate -Continue monitor on telemetry  HLD, stable -Continue statin therapy  Alcohol use.  States last drink 1 week ago.  No current signs or symptoms of alcohol withdrawal. -Monitor CIWA protocol     Family Communication  : called and updated his nephew Dr. [name] [phone] per patient request  Code Status :  FULL CODE  Disposition Plan  :  Patient is from home. Anticipated d/c date: 2 to 3 days. Barriers to d/c or necessity for inpatient status:  Patient will need to move on observation status to inpatient given patient requiring IV fluids related to diminished oral intake being down for unknown amount of time and cognitive impairment limiting ability to eat consistently, also continuing daily regimen of B12, close monitoring for other reversible etiologies including infectious. Consults  :  none  Procedures  :  none  DVT Prophylaxis  : Heparin  Lab Results  Component Value Date   PLT 252 06/20/2020    Diet :  Diet Order            Diet Heart Room service appropriate? Yes; Fluid consistency: Thin  Diet effective now                  Inpatient Medications Scheduled Meds: . aspirin EC  81 mg Oral Daily  . cyanocobalamin  1,000 mcg Intramuscular Daily  . heparin  5,000 Units Subcutaneous Q8H   . potassium chloride  40 mEq Oral Q1 Hr x 2  . pravastatin  20 mg Oral Daily  . QUEtiapine  25 mg Oral QHS  . sodium chloride flush  3 mL Intravenous Q12H  . tamsulosin  0.4 mg Oral Daily   Continuous Infusions: . lactated ringers 100 mL/hr at 06/20/20 1227   PRN Meds:.hydrALAZINE  Antibiotics  :   Anti-infectives (From admission, onward)   None       Objective   Vitals:   06/20/20 0620 06/20/20 1110 06/20/20 1112 06/20/20 1114  BP: (!) 112/88 123/77 123/83 109/79  Pulse: 84 100 (!) 107 (!) 110  Resp: 20 21    Temp: 99.9 F (37.7 C) 99.5 F (37.5 C)    TempSrc: Oral Oral    SpO2: 96% 94% 96% 100%  Weight:      Height:        SpO2: 100 %  Wt Readings from Last 3 Encounters:  06/19/20 (!) 102.7 kg  05/20/20 112.2 kg  09/03/19 129.4 kg     Intake/Output Summary (Last 24 hours) at 06/20/2020 1329 Last data filed at 06/20/2020 1100 Gross per 24 hour  Intake 16.11 ml  Output 600 ml  Net -583.89 ml    Physical Exam:     Awake, oriented to self, context Following  commands Regular rate and rhythm, no appreciable murmurs rubs or gallops, no peripheral edema, no appreciable JVD Normal respiratory effort on room air, lungs clear bilaterally Abdomen soft, nondistended, nontender, normal bowel sounds No rashes   I have personally reviewed the following:   Data Reviewed:  CBC Recent Labs  Lab 06/19/20 0744 06/20/20 0516  WBC 6.7 5.1  HGB 15.1 12.7*  HCT 47.0 38.2*  PLT 296 252  MCV 89.4 88.0  MCH 28.7 29.3  MCHC 32.1 33.2  RDW 13.1 13.2  LYMPHSABS 0.3*  --   MONOABS 0.6  --   EOSABS 0.0  --   BASOSABS 0.0  --     Chemistries  Recent Labs  Lab 06/19/20 0744 06/20/20 0516  NA 136 135  K 3.7 3.4*  CL 97* 99  CO2 21* 25  GLUCOSE 122* 90  BUN 32* 39*  CREATININE 2.92* 2.49*  CALCIUM 9.3 8.4*  AST 41  --   ALT 28  --   ALKPHOS 85  --   BILITOT 1.3*  --     ------------------------------------------------------------------------------------------------------------------ Recent Labs    06/19/20 1621  CHOL 114  HDL 33*  LDLCALC 60  TRIG 106  CHOLHDL 3.5    Lab Results  Component Value Date   HGBA1C 5.6 05/20/2020   ------------------------------------------------------------------------------------------------------------------ No results for input(s): TSH, T4TOTAL, T3FREE, THYROIDAB in the last 72 hours.  Invalid input(s): FREET3 ------------------------------------------------------------------------------------------------------------------ Recent Labs    06/19/20 1621  VITAMINB12 191    Coagulation profile No results for input(s): INR, PROTIME in the last 168 hours.  No results for input(s): DDIMER in the last 72 hours.  Cardiac Enzymes No results for input(s): CKMB, TROPONINI, MYOGLOBIN in the last 168 hours.  Invalid input(s): CK ------------------------------------------------------------------------------------------------------------------ No results found for: BNP  Micro Results Recent Results (from the past 240 hour(s))  SARS Coronavirus 2 by RT PCR (hospital order, performed in [facility] hospital lab) Nasopharyngeal Nasopharyngeal Swab     Status: None   Collection Time: 06/19/20  1:27 PM   Specimen: Nasopharyngeal Swab  Result Value Ref Range Status   SARS Coronavirus 2 NEGATIVE NEGATIVE Final    Comment: (NOTE) SARS-CoV-2 target nucleic acids are NOT DETECTED.  The SARS-CoV-2 RNA is generally detectable in upper and lower respiratory specimens during the acute phase of infection. The lowest concentration of SARS-CoV-2 viral copies this assay can detect is 250 copies / mL. A negative result does not preclude SARS-CoV-2 infection and should not be used as the sole basis for treatment or other patient management decisions.  A negative result may occur with improper specimen collection / handling,  submission of specimen other than nasopharyngeal swab, presence of viral mutation(s) within the areas targeted by this assay, and inadequate number of viral copies (<250 copies / mL). A negative result must be combined with clinical observations, patient history, and epidemiological information.  Fact Sheet for Patients:   StrictlyIdeas.no  Fact Sheet for Healthcare Providers: BankingDealers.co.za  This test is not yet approved or  cleared by the [place] FDA and has been authorized for detection and/or diagnosis of SARS-CoV-2 by FDA under an Emergency Use Authorization (EUA).  This EUA will remain in effect (meaning this test can be used) for the duration of the COVID-19 declaration under Section 564(b)(1) of the Act, 21 U.S.C. section 360bbb-3(b)(1), unless the authorization is terminated or revoked sooner.  Performed at [facility], [place] [address] [place], [place] [address]     Radiology Reports CT Head  Wo Contrast  Result Date: 06/19/2020 CLINICAL DATA:  Weakness status post fall. EXAM: CT HEAD WITHOUT CONTRAST TECHNIQUE: Contiguous axial images were obtained from the base of the skull through the vertex without intravenous contrast. COMPARISON:  04/17/2012 FINDINGS: Brain: There is no evidence of acute infarct, intracranial hemorrhage, mass, midline shift, or extra-axial fluid collection. Confluent hypodensities in the cerebral white matter bilaterally are nonspecific but compatible with severe chronic small vessel ischemic disease, stable to slightly progressed from the prior CT. There is a chronic lacunar infarct in the right thalamus. Vascular: Calcified atherosclerosis at the skull base. No hyperdense vessel. Skull: No fracture or suspicious osseous lesion. Sinuses/Orbits: Visualized paranasal sinuses and mastoid air cells are clear. Unremarkable orbits. Other: None. IMPRESSION: 1. No evidence of acute  intracranial abnormality. 2. Severe chronic small vessel ischemic disease. Electronically Signed   By: Logan Bores M.D.   On: 06/19/2020 08:40   MR BRAIN WO CONTRAST  Result Date: 06/19/2020 CLINICAL DATA:  Neuro deficit.  Stroke suspected. EXAM: MRI HEAD WITHOUT CONTRAST TECHNIQUE: Multiplanar, multiecho pulse sequences of the brain and surrounding structures were obtained without intravenous contrast. COMPARISON:  CT head without contrast 06/19/2020. FINDINGS: Brain: Confluent periventricular and subcortical T2 hyperintensities are present bilaterally. No acute infarct, hemorrhage, or mass lesion is present. Remote lacunar infarcts are present within the thalami bilaterally. White matter changes extend into the brainstem. A remote lacunar infarct is present in the left cerebellum. Vascular: Flow is present in the major intracranial arteries. Skull and upper cervical spine: Degenerative changes are present at C1-2. Craniocervical junction is within normal limits. Midline structures are unremarkable. Sinuses/Orbits: The paranasal sinuses and mastoid air cells are clear. The globes and orbits are within normal limits. IMPRESSION: 1. No acute intracranial abnormality. 2. Confluent periventricular and subcortical T2 hyperintensities bilaterally are advanced for age. This likely reflects the sequela of chronic microvascular ischemia. 3. Remote lacunar infarcts of the thalami bilaterally and left cerebellum. Electronically Signed   By: San Morelle M.D.   On: 06/19/2020 15:37     Time Spent in minutes  30     Desiree Hane M.D on 06/20/2020 at 1:29 PM  To page go to [email or web address] - password [facility]

## 2020-06-20 NOTE — Progress Notes (Signed)
Initial Nutrition Assessment  DOCUMENTATION CODES:   Non-severe (moderate) malnutrition in context of chronic illness  INTERVENTION:  Ensure Enlive po TID, each supplement provides 350 kcal and 20 grams of protein  Magic cup TID with meals, each supplement provides 290 kcal and 9 grams of protein  MVI daily  Recommend liberalizing diet to encourage po intake  NUTRITION DIAGNOSIS:   Moderate Malnutrition related to chronic illness (suspected undiagnosed dementia) as evidenced by energy intake < or equal to 75% for > or equal to 1 month, mild muscle depletion, mild fat depletion, moderate fat depletion, moderate muscle depletion, percent weight loss.    GOAL:   Patient will meet greater than or equal to 90% of their needs    MONITOR:   PO intake, Supplement acceptance, Weight trends, Labs, I & O's, Skin  REASON FOR ASSESSMENT:   Consult Assessment of nutrition requirement/status  ASSESSMENT:   Pt admitted for recurrent falls of unknown etiology. PMH includes hx drug abuse, neuropathy, prostate Ca with radioactive seed implantation (diagnosed 09/2018), recurrent falls, HTN, HLD, CAD s/p stent, EtOH use  Per MD, recurrent falls are suspected to be due to symptomatic B12 deficiency affecting gait in setting of possibly undiagnosed dementia given atrophy on MRI.   Pt lethargic upon examination and difficult to obtain history from. Pt states that he was eating only 1 meal per day PTA that consisted of eggs, bacon, and sausage. Pt states that he used to eat 3 meals per day, but could not recall how long ago this occurred. Pt also unsure of UBW and any changes to his wt status. Pt fell asleep during interview.   Per MD documentation, pt's nephew is a doctor in [place] who reported pt has been declining over the last several months.   Per wt readings, pt weighed 129.4 kg on 09/03/19 and 112.2 kg on 05/20/20. Pt now weighs 102.7kg. This would indicate a 20% wt loss x 9 months and an  8.2% wt loss x1 month, both of which are significant for time frame. Wt appears to have been stable prior to October 2020.   PO Intake: 10-25% x2 recorded meals  Labs: K+ 3.4 (L), Mg 2.6 (H) Medications: Vitamin B12  NUTRITION - FOCUSED PHYSICAL EXAM:    Most Recent Value  Orbital Region Moderate depletion  Upper Arm Region Mild depletion  Thoracic and Lumbar Region No depletion  Buccal Region Mild depletion  Temple Region Mild depletion  Clavicle Bone Region Mild depletion  Clavicle and Acromion Bone Region No depletion  Scapular Bone Region No depletion  Dorsal Hand Moderate depletion  Patellar Region Mild depletion  Anterior Thigh Region Mild depletion  Posterior Calf Region Mild depletion  Edema (RD Assessment) None  Hair Reviewed  Eyes Reviewed  Mouth Reviewed  Skin Reviewed  Nails Reviewed       Diet Order:   Diet Order            Diet Heart Room service appropriate? Yes; Fluid consistency: Thin  Diet effective now                 EDUCATION NEEDS:   Not appropriate for education at this time  Skin:  Skin Assessment: Reviewed RN Assessment  Last BM:  unknown  Height:   Ht Readings from Last 1 Encounters:  06/18/20 6' (1.829 m)    Weight:   Wt Readings from Last 10 Encounters:  06/19/20 (!) 102.7 kg  05/20/20 112.2 kg  09/03/19 129.4 kg  01/20/19 126.3  kg  12/04/18 125.6 kg  11/14/18 127.3 kg  11/11/18 126.2 kg  09/10/18 125.7 kg  05/12/18 130.6 kg  12/06/17 129.9 kg    BMI:  Body mass index is 30.72 kg/m.  Estimated Nutritional Needs:   Kcal:  2400-2600  Protein:  120-130 grams  Fluid:  >/=2.4L/d    Larkin Ina, MS, RD, LDN RD pager number and weekend/on-call pager number located in [place].

## 2020-06-21 DIAGNOSIS — E440 Moderate protein-calorie malnutrition: Secondary | ICD-10-CM

## 2020-06-21 DIAGNOSIS — D709 Neutropenia, unspecified: Secondary | ICD-10-CM

## 2020-06-21 LAB — DIFFERENTIAL
Abs Immature Granulocytes: 0.01 10*3/uL (ref 0.00–0.07)
Basophils Absolute: 0 10*3/uL (ref 0.0–0.1)
Basophils Relative: 1 %
Eosinophils Absolute: 0.1 10*3/uL (ref 0.0–0.5)
Eosinophils Relative: 5 %
Immature Granulocytes: 0 %
Lymphocytes Relative: 28 %
Lymphs Abs: 0.7 10*3/uL (ref 0.7–4.0)
Monocytes Absolute: 0.5 10*3/uL (ref 0.1–1.0)
Monocytes Relative: 19 %
Neutro Abs: 1.2 10*3/uL — ABNORMAL LOW (ref 1.7–7.7)
Neutrophils Relative %: 47 %

## 2020-06-21 LAB — CBC WITH DIFFERENTIAL/PLATELET
Abs Immature Granulocytes: 0.01 10*3/uL (ref 0.00–0.07)
Basophils Absolute: 0 10*3/uL (ref 0.0–0.1)
Basophils Relative: 1 %
Eosinophils Absolute: 0.1 10*3/uL (ref 0.0–0.5)
Eosinophils Relative: 5 %
HCT: 35.6 % — ABNORMAL LOW (ref 39.0–52.0)
Hemoglobin: 11.5 g/dL — ABNORMAL LOW (ref 13.0–17.0)
Immature Granulocytes: 0 %
Lymphocytes Relative: 25 %
Lymphs Abs: 0.7 10*3/uL (ref 0.7–4.0)
MCH: 28.9 pg (ref 26.0–34.0)
MCHC: 32.3 g/dL (ref 30.0–36.0)
MCV: 89.4 fL (ref 80.0–100.0)
Monocytes Absolute: 0.6 10*3/uL (ref 0.1–1.0)
Monocytes Relative: 21 %
Neutro Abs: 1.3 10*3/uL — ABNORMAL LOW (ref 1.7–7.7)
Neutrophils Relative %: 48 %
Platelets: 189 10*3/uL (ref 150–400)
RBC: 3.98 MIL/uL — ABNORMAL LOW (ref 4.22–5.81)
RDW: 13 % (ref 11.5–15.5)
WBC: 2.8 10*3/uL — ABNORMAL LOW (ref 4.0–10.5)
nRBC: 0 % (ref 0.0–0.2)

## 2020-06-21 LAB — BASIC METABOLIC PANEL
Anion gap: 8 (ref 5–15)
BUN: 30 mg/dL — ABNORMAL HIGH (ref 8–23)
CO2: 26 mmol/L (ref 22–32)
Calcium: 8.1 mg/dL — ABNORMAL LOW (ref 8.9–10.3)
Chloride: 100 mmol/L (ref 98–111)
Creatinine, Ser: 1.78 mg/dL — ABNORMAL HIGH (ref 0.61–1.24)
GFR calc Af Amer: 44 mL/min — ABNORMAL LOW (ref >60)
GFR calc non Af Amer: 38 mL/min — ABNORMAL LOW (ref >60)
Glucose, Bld: 93 mg/dL (ref 70–99)
Potassium: 3.1 mmol/L — ABNORMAL LOW (ref 3.5–5.1)
Sodium: 134 mmol/L — ABNORMAL LOW (ref 135–145)

## 2020-06-21 LAB — CBC
HCT: 36.1 % — ABNORMAL LOW (ref 39.0–52.0)
Hemoglobin: 11.9 g/dL — ABNORMAL LOW (ref 13.0–17.0)
MCH: 29.3 pg (ref 26.0–34.0)
MCHC: 33 g/dL (ref 30.0–36.0)
MCV: 88.9 fL (ref 80.0–100.0)
Platelets: 214 10*3/uL (ref 150–400)
RBC: 4.06 MIL/uL — ABNORMAL LOW (ref 4.22–5.81)
RDW: 13 % (ref 11.5–15.5)
WBC: 2.6 10*3/uL — ABNORMAL LOW (ref 4.0–10.5)
nRBC: 0 % (ref 0.0–0.2)

## 2020-06-21 LAB — MAGNESIUM: Magnesium: 2.3 mg/dL (ref 1.7–2.4)

## 2020-06-21 MED ORDER — HYDROCHLOROTHIAZIDE 25 MG PO TABS
25.0000 mg | ORAL_TABLET | Freq: Every day | ORAL | Status: DC
  Administered 2020-06-21: 25 mg via ORAL
  Filled 2020-06-21: qty 1

## 2020-06-21 MED ORDER — POTASSIUM CHLORIDE 20 MEQ PO PACK
40.0000 meq | PACK | Freq: Once | ORAL | Status: AC
  Administered 2020-06-21: 40 meq via ORAL
  Filled 2020-06-21: qty 2

## 2020-06-21 NOTE — Progress Notes (Signed)
[facility]  PROGRESS NOTE  Edward Stafford WUJ:[id number] DOB: Jul 19, 1951 DOA: 06/19/2020 PCP: Colon Branch, MD Admit date - 06/19/2020   Admitting Physician Desiree Hane, MD  Outpatient Primary MD for the patient is Colon Branch, MD  LOS - 1 Brief Narrative   Edward Stafford is a 69 y.o. year old male with medical history significant for prostate cancer (dx'd 09/2018 with radioactive seed implantation, HTN, HLD, CAD status post stent, alcohol use, chronic back pain, drug abuse (cocaine, marijuana), who presented on 06/19/2020 with reports of recurrent falls over the past week and difficulty walking.  In the ED T-max 98.4, heart rate 109, blood pressure 138/103.  Lab work notable for proteinuria (100), small amount of hemoglobin in UA, Covid test negative, CK 539, creatinine 2.92 (baseline 1.4), BUN 32, CO2 21, anion gap 18. CT head with no acute etiology, showed severe chronic small vessel ischemic disease. Patient was admitted to [facility] service for further work-up of recurrent falls, AKI on CKD, mild rhabdomyolysis anion gap metabolic acidosis  In work-up of recurrent falls found to have B12 191.  MRI brain showed no acute disease but did show chronic ischemia and atrophy.  Patient has not had any arrhythmias on telemetry monitoring.  Suspect his recurrent falls related to shuffling gait/peripheral neuropathy from symptomatic B12 deficiency.  Found to have mild lactic acidosis that resolved with IV fluids likely contributing to anion gap metabolic acidosis.  Subjective  Today remembers he fell before he came in hospital. No complaints  A & P   Recurrent falls, suspect  symptomatic B12 deficiency affecting gait in setting of possibly undiagnosed dementia given atrophy on MRI scan (questionable vascular dementia or Parkinson's with shuffling gait),.  Likely additionally worsened by diminished oral intake causing relative hypotension while patient was taking home BP meds in setting  of likely cognitive impairment.  Orthostatic vitals negative, TSH within normal limits,  -IM B12 daily x7 days -PT recommends SNF  Neutropenia, new. ANC 1200.No fevers, no localizing symptoms. New leukopenia this morning.  -repeat CBC with diff in am -if spikes fever start appropriate empiric antibiotics and blood/urine cultures    Symptomatic B12 deficiency.  Patient with shuffling gait, peripheral neuropathy, cognitive impairment and recurrent falls.  B12 191.  -Daily IM B12 x7 days  Cognitive impairment, high concern for dementia, possibly vascular given MRI imaging, Parkinsons also consideration with shuffling gait.  Nephew reports significant decline over the past several months prior to admission. -Delirium precautions, high risk for sundowning, Seroquel nightly -PT recommends SNF, social worker consulted  Relative hypotension and tachycardia, resolved   Suspect related to potential dehydration given improvement with IV fluids and holding home blood pressure medicines.  TSH within normal limits, orthostatic vitals within normal limits.    T-max of 100 on admission, does have new leukopenia but no localizing signs or symptoms of infection.  -Blood cultures if becomes febrile   AKI on CKD stage IIIa, improving baseline creatinine of 1.4-1.5, 2.92 on admission, continues to downtrend.  Suspect prerenal etiology related to diminished oral intake/hypotension and UA with hyaline casts, though does note some proteinuria on UA and elevated CK, need to rule out post renal/intrarenal.  Mild parenchymal atrophy on renal ultrasound, and some degree of bladder distention -Hold home losartan -Close monitor ins and outs -Bladder scan, as needed in and out catheterization if  greater than 250 cc on PVR -Avoid nephrotoxins  Rhabdomyolysis, improving.  In setting of fall.  Has no muscle pain.  CK already downtrending after IVF   Hypokalemia, persists no current GI losses.  Is on diuretic  (HCTZ) -Replete orally -check magnesium -Monitor BMP  Normocytic anemia, new.  Previous baseline hemoglobin 15, 12.7 this a.m.  Could be dilution related to IV fluids.  No signs or symptoms of bleeding. -Repeat CBC this afternoon  BPH, with history of prostate cancer.  Bladder scan today showed retention of 350 cc.   -Monitor intake and output -Bladder scan, as needed in and out catheterization if greater than 250 cc on PVR -Continue home Flomax  History of hypertension.  Now becoming more hypertensive with SBP's in the 150s. -Resume home HCTZ -Holding home losartan in setting of AKI  Anion gap metabolic acidosis secondary to lactic acidosis, resolved.  Likely mild lactic acidosis related to relative hypotension in the setting of diminished oral intake  Hyperlipidemia, stable -continue statin  History of bradycardia.  I reviewed telemetry has remained in normal sinus rhythm and normal rate -Continue monitor on telemetry  HLD, stable -Continue statin therapy  Alcohol use.  States last drink 1 week ago.  No current signs or symptoms of alcohol withdrawal. -Monitor CIWA protocol     Family Communication  : called and updated his nephew Dr. [name] [phone] per patient request on 7/26  Code Status :  FULL CODE  Disposition Plan  :  Patient is from home. Anticipated d/c date: 2 to 3 days. Barriers to d/c or necessity for inpatient status:  ensure stability of Creatinine, WBC, patient is not safe to return home with obvious cognitive impairment and inability to take of himself and no 24 h assist. PT recommends SNF  Consults  :  none  Procedures  :  none  DVT Prophylaxis  : Heparin  Lab Results  Component Value Date   PLT 214 06/21/2020    Diet :  Diet Order            Diet Heart Room service appropriate? Yes; Fluid consistency: Thin  Diet effective now                  Inpatient Medications Scheduled Meds: . aspirin EC  81 mg Oral Daily  .  cyanocobalamin  1,000 mcg Intramuscular Daily  . feeding supplement (ENSURE ENLIVE)  237 mL Oral TID BM  . heparin  5,000 Units Subcutaneous Q8H  . hydrochlorothiazide  25 mg Oral Daily  . multivitamin with minerals  1 tablet Oral Daily  . pravastatin  20 mg Oral Daily  . QUEtiapine  25 mg Oral QHS  . sodium chloride flush  3 mL Intravenous Q12H  . tamsulosin  0.4 mg Oral Daily   Continuous Infusions:  PRN Meds:.hydrALAZINE  Antibiotics  :   Anti-infectives (From admission, onward)   None       Objective   Vitals:   06/21/20 0210 06/21/20 0426 06/21/20 0800 06/21/20 1516  BP: (!) 153/98 (!) 148/96  (!) 141/96  Pulse: 69 67 78 75  Resp: 20 20  19   Temp: 99.3 F (37.4 C) 99.5 F (37.5 C)  98.6 F (37 C)  TempSrc: Oral Oral  Oral  SpO2: 99% 98%  99%  Weight:      Height:        SpO2: 99 %  Wt Readings from Last 3 Encounters:  06/19/20 (!) 102.7 kg  05/20/20 112.2 kg  09/03/19 129.4 kg     Intake/Output Summary (Last 24 hours) at 06/21/2020 1543 Last data filed at 06/21/2020  1506 Gross per 24 hour  Intake 2576.67 ml  Output 1250 ml  Net 1326.67 ml    Physical Exam:     Awake, oriented to self, context Following commands Regular rate and rhythm, no appreciable murmurs rubs or gallops, no peripheral edema, no appreciable JVD Normal respiratory effort on room air, lungs clear bilaterally Abdomen soft, nondistended, nontender, normal bowel sounds No rashes   I have personally reviewed the following:   Data Reviewed:  CBC Recent Labs  Lab 06/19/20 0744 06/20/20 0516 06/20/20 1345 06/21/20 0512 06/21/20 0816  WBC 6.7 5.1 4.2 2.8* 2.6*  HGB 15.1 12.7* 12.0* 11.5* 11.9*  HCT 47.0 38.2* 36.2* 35.6* 36.1*  PLT 296 252 209 189 214  MCV 89.4 88.0 87.7 89.4 88.9  MCH 28.7 29.3 29.1 28.9 29.3  MCHC 32.1 33.2 33.1 32.3 33.0  RDW 13.1 13.2 13.2 13.0 13.0  LYMPHSABS 0.3*  --   --  0.7  --   MONOABS 0.6  --   --  0.6  --   EOSABS 0.0  --   --  0.1  --    BASOSABS 0.0  --   --  0.0  --     Chemistries  Recent Labs  Lab 06/19/20 0744 06/20/20 0516 06/20/20 1345 06/21/20 0512  NA 136 135  --  134*  K 3.7 3.4*  --  3.1*  CL 97* 99  --  100  CO2 21* 25  --  26  GLUCOSE 122* 90  --  93  BUN 32* 39*  --  30*  CREATININE 2.92* 2.49*  --  1.78*  CALCIUM 9.3 8.4*  --  8.1*  MG  --   --  2.6* 2.3  AST 41  --   --   --   ALT 28  --   --   --   ALKPHOS 85  --   --   --   BILITOT 1.3*  --   --   --    ------------------------------------------------------------------------------------------------------------------ Recent Labs    06/19/20 1621  CHOL 114  HDL 33*  LDLCALC 60  TRIG 106  CHOLHDL 3.5    Lab Results  Component Value Date   HGBA1C 5.6 05/20/2020   ------------------------------------------------------------------------------------------------------------------ Recent Labs    06/20/20 1345  TSH 0.888   ------------------------------------------------------------------------------------------------------------------ Recent Labs    06/19/20 1621  VITAMINB12 191    Coagulation profile No results for input(s): INR, PROTIME in the last 168 hours.  No results for input(s): DDIMER in the last 72 hours.  Cardiac Enzymes No results for input(s): CKMB, TROPONINI, MYOGLOBIN in the last 168 hours.  Invalid input(s): CK ------------------------------------------------------------------------------------------------------------------ No results found for: BNP  Micro Results Recent Results (from the past 240 hour(s))  SARS Coronavirus 2 by RT PCR (hospital order, performed in [facility] hospital lab) Nasopharyngeal Nasopharyngeal Swab     Status: None   Collection Time: 06/19/20  1:27 PM   Specimen: Nasopharyngeal Swab  Result Value Ref Range Status   SARS Coronavirus 2 NEGATIVE NEGATIVE Final    Comment: (NOTE) SARS-CoV-2 target nucleic acids are NOT DETECTED.  The SARS-CoV-2 RNA is generally detectable in  upper and lower respiratory specimens during the acute phase of infection. The lowest concentration of SARS-CoV-2 viral copies this assay can detect is 250 copies / mL. A negative result does not preclude SARS-CoV-2 infection and should not be used as the sole basis for treatment or other patient management decisions.  A negative result may occur with  improper specimen collection / handling, submission of specimen other than nasopharyngeal swab, presence of viral mutation(s) within the areas targeted by this assay, and inadequate number of viral copies (<250 copies / mL). A negative result must be combined with clinical observations, patient history, and epidemiological information.  Fact Sheet for Patients:   StrictlyIdeas.no  Fact Sheet for Healthcare Providers: BankingDealers.co.za  This test is not yet approved or  cleared by the [place] FDA and has been authorized for detection and/or diagnosis of SARS-CoV-2 by FDA under an Emergency Use Authorization (EUA).  This EUA will remain in effect (meaning this test can be used) for the duration of the COVID-19 declaration under Section 564(b)(1) of the Act, 21 U.S.C. section 360bbb-3(b)(1), unless the authorization is terminated or revoked sooner.  Performed at [facility], [place] [address] [place], [place] [address]     Radiology Reports CT Head Wo Contrast  Result Date: 06/19/2020 CLINICAL DATA:  Weakness status post fall. EXAM: CT HEAD WITHOUT CONTRAST TECHNIQUE: Contiguous axial images were obtained from the base of the skull through the vertex without intravenous contrast. COMPARISON:  04/17/2012 FINDINGS: Brain: There is no evidence of acute infarct, intracranial hemorrhage, mass, midline shift, or extra-axial fluid collection. Confluent hypodensities in the cerebral white matter bilaterally are nonspecific but compatible with severe chronic small vessel  ischemic disease, stable to slightly progressed from the prior CT. There is a chronic lacunar infarct in the right thalamus. Vascular: Calcified atherosclerosis at the skull base. No hyperdense vessel. Skull: No fracture or suspicious osseous lesion. Sinuses/Orbits: Visualized paranasal sinuses and mastoid air cells are clear. Unremarkable orbits. Other: None. IMPRESSION: 1. No evidence of acute intracranial abnormality. 2. Severe chronic small vessel ischemic disease. Electronically Signed   By: Logan Bores M.D.   On: 06/19/2020 08:40   MR BRAIN WO CONTRAST  Result Date: 06/19/2020 CLINICAL DATA:  Neuro deficit.  Stroke suspected. EXAM: MRI HEAD WITHOUT CONTRAST TECHNIQUE: Multiplanar, multiecho pulse sequences of the brain and surrounding structures were obtained without intravenous contrast. COMPARISON:  CT head without contrast 06/19/2020. FINDINGS: Brain: Confluent periventricular and subcortical T2 hyperintensities are present bilaterally. No acute infarct, hemorrhage, or mass lesion is present. Remote lacunar infarcts are present within the thalami bilaterally. White matter changes extend into the brainstem. A remote lacunar infarct is present in the left cerebellum. Vascular: Flow is present in the major intracranial arteries. Skull and upper cervical spine: Degenerative changes are present at C1-2. Craniocervical junction is within normal limits. Midline structures are unremarkable. Sinuses/Orbits: The paranasal sinuses and mastoid air cells are clear. The globes and orbits are within normal limits. IMPRESSION: 1. No acute intracranial abnormality. 2. Confluent periventricular and subcortical T2 hyperintensities bilaterally are advanced for age. This likely reflects the sequela of chronic microvascular ischemia. 3. Remote lacunar infarcts of the thalami bilaterally and left cerebellum. Electronically Signed   By: San Morelle M.D.   On: 06/19/2020 15:37   US RENAL  Result Date:  06/20/2020 CLINICAL DATA:  69 year old male with acute on chronic renal insufficiency. EXAM: RENAL / URINARY TRACT ULTRASOUND COMPLETE COMPARISON:  None. FINDINGS: Right Kidney: Renal measurements: 12.1 x 4.6 x 5.2 cm = volume: 151 mL. Mild parenchyma atrophy. Normal echogenicity. No hydronephrosis or shadowing stone. Left Kidney: Renal measurements: 13.4 x 5.7 x 5.0 cm = volume: 199 mL. Normal echogenicity. Mild parenchyma atrophy. No hydronephrosis or shadowing stone. Bladder: Appears normal for degree of bladder distention. Other: None. IMPRESSION: Mild parenchyma atrophy, otherwise unremarkable renal ultrasound. Electronically  Signed   By: Anner Crete M.D.   On: 06/20/2020 17:28     Time Spent in minutes  30     Desiree Hane M.D on 06/21/2020 at 3:43 PM  To page go to [email or web address] - password [facility]

## 2020-06-21 NOTE — Progress Notes (Signed)
Physical Therapy Treatment Patient Details Name: Edward Stafford MRN: [id number] DOB: 07-May-1951 Today's Date: 06/21/2020    History of Present Illness 69yo male seen in ED after fall at home, reportedly on the floor for ~ 4-5 hrs. pt does not recall falling. in bed for 1-2 days after roommate helped him up. Head CT negative. PMH: HTN, CKD, prostate CA    PT Comments    Pt continues to require +2 assist for mobility. He remains at high risk for falls. Near shuffling gait pattern. Continue to recommend SNF.    Follow Up Recommendations  SNF     Equipment Recommendations  None recommended by PT    Recommendations for Other Services       Precautions / Restrictions Precautions Precautions: Fall Restrictions Weight Bearing Restrictions: No    Mobility  Bed Mobility Overal bed mobility: Needs Assistance Bed Mobility: Supine to Sit     Supine to sit: Mod assist;+2 for physical assistance;+2 for safety/equipment     General bed mobility comments: Multimodal cueing required. Assist for trunk and to scoot to EOB. Increased time.  Transfers Overall transfer level: Needs assistance Equipment used: Rolling walker (2 wheeled) Transfers: Sit to/from Stand Sit to Stand: Min assist;+2 physical assistance;+2 safety/equipment         General transfer comment: VCs safety, technique, hand placement. Assist to rise, stabilize, control descent. Increased time.  Ambulation/Gait Ambulation/Gait assistance: Min assist;+2 physical assistance;+2 safety/equipment Gait Distance (Feet): 60 Feet Assistive device: Rolling walker (2 wheeled) Gait Pattern/deviations: Step-through pattern;Decreased step length - right;Decreased step length - left;Decreased stride length;Trunk flexed     General Gait Details: Decreased push off and step lengths bilaterally (worse on R). VCs for safety, posture, RW proximity, step lengths. Pt able to intermittently correct step length on L but not on R.  Fatigues fairly easily.   Stairs             Wheelchair Mobility    Modified Rankin (Stroke Patients Only)       Balance Overall balance assessment: History of Falls         Standing balance support: Bilateral upper extremity supported Standing balance-Leahy Scale: Poor                              Cognition Arousal/Alertness: Awake/alert Behavior During Therapy: Flat affect Overall Cognitive Status: Impaired/Different from baseline Area of Impairment: Attention;Safety/judgement;Problem solving                     Memory: Decreased short-term memory Following Commands: Follows one step commands with increased time Safety/Judgement: Decreased awareness of safety;Decreased awareness of deficits   Problem Solving: Slow processing;Decreased initiation;Difficulty sequencing;Requires verbal cues;Requires tactile cues        Exercises      General Comments        Pertinent Vitals/Pain Pain Assessment: No/denies pain    Home Living                      Prior Function            PT Goals (current goals can now be found in the care plan section) Progress towards PT goals: Progressing toward goals    Frequency    Min 2X/week      PT Plan Current plan remains appropriate    Co-evaluation              AM-PAC PT "6  Clicks" Mobility   Outcome Measure  Help needed turning from your back to your side while in a flat bed without using bedrails?: A Lot Help needed moving from lying on your back to sitting on the side of a flat bed without using bedrails?: A Lot Help needed moving to and from a bed to a chair (including a wheelchair)?: A Little Help needed standing up from a chair using your arms (e.g., wheelchair or bedside chair)?: A Little Help needed to walk in hospital room?: A Little Help needed climbing 3-5 steps with a railing? : A Lot 6 Click Score: 15    End of Session Equipment Utilized During Treatment:  Gait belt Activity Tolerance: Patient limited by fatigue Patient left: in chair;with call bell/phone within reach;with chair alarm set   PT Visit Diagnosis: Muscle weakness (generalized) (M62.81);History of falling (Z91.81);Other abnormalities of gait and mobility (R26.89)     Time: 8144-8185 PT Time Calculation (min) (ACUTE ONLY): 19 min  Charges:  $Gait Training: 8-22 mins                         Doreatha Massed, PT Acute Rehabilitation  Office: [phone] Pager: [phone]

## 2020-06-22 DIAGNOSIS — N400 Enlarged prostate without lower urinary tract symptoms: Secondary | ICD-10-CM

## 2020-06-22 DIAGNOSIS — E872 Acidosis: Secondary | ICD-10-CM

## 2020-06-22 DIAGNOSIS — I10 Essential (primary) hypertension: Secondary | ICD-10-CM

## 2020-06-22 LAB — COMPREHENSIVE METABOLIC PANEL
ALT: 22 U/L (ref 0–44)
AST: 24 U/L (ref 15–41)
Albumin: 3.1 g/dL — ABNORMAL LOW (ref 3.5–5.0)
Alkaline Phosphatase: 64 U/L (ref 38–126)
Anion gap: 10 (ref 5–15)
BUN: 22 mg/dL (ref 8–23)
CO2: 25 mmol/L (ref 22–32)
Calcium: 8.3 mg/dL — ABNORMAL LOW (ref 8.9–10.3)
Chloride: 100 mmol/L (ref 98–111)
Creatinine, Ser: 1.27 mg/dL — ABNORMAL HIGH (ref 0.61–1.24)
GFR calc Af Amer: >60 mL/min (ref >60)
GFR calc non Af Amer: 58 mL/min — ABNORMAL LOW (ref >60)
Glucose, Bld: 96 mg/dL (ref 70–99)
Potassium: 3.3 mmol/L — ABNORMAL LOW (ref 3.5–5.1)
Sodium: 135 mmol/L (ref 135–145)
Total Bilirubin: 0.6 mg/dL (ref 0.3–1.2)
Total Protein: 6.2 g/dL — ABNORMAL LOW (ref 6.5–8.1)

## 2020-06-22 LAB — CBC WITH DIFFERENTIAL/PLATELET
Abs Immature Granulocytes: 0.01 10*3/uL (ref 0.00–0.07)
Basophils Absolute: 0 10*3/uL (ref 0.0–0.1)
Basophils Relative: 1 %
Eosinophils Absolute: 0.3 10*3/uL (ref 0.0–0.5)
Eosinophils Relative: 9 %
HCT: 35.3 % — ABNORMAL LOW (ref 39.0–52.0)
Hemoglobin: 11.8 g/dL — ABNORMAL LOW (ref 13.0–17.0)
Immature Granulocytes: 0 %
Lymphocytes Relative: 28 %
Lymphs Abs: 0.9 10*3/uL (ref 0.7–4.0)
MCH: 29.1 pg (ref 26.0–34.0)
MCHC: 33.4 g/dL (ref 30.0–36.0)
MCV: 87.2 fL (ref 80.0–100.0)
Monocytes Absolute: 0.5 10*3/uL (ref 0.1–1.0)
Monocytes Relative: 16 %
Neutro Abs: 1.4 10*3/uL — ABNORMAL LOW (ref 1.7–7.7)
Neutrophils Relative %: 46 %
Platelets: 215 10*3/uL (ref 150–400)
RBC: 4.05 MIL/uL — ABNORMAL LOW (ref 4.22–5.81)
RDW: 12.8 % (ref 11.5–15.5)
WBC: 3.1 10*3/uL — ABNORMAL LOW (ref 4.0–10.5)
nRBC: 0 % (ref 0.0–0.2)

## 2020-06-22 MED ORDER — POTASSIUM CHLORIDE CRYS ER 20 MEQ PO TBCR
40.0000 meq | EXTENDED_RELEASE_TABLET | Freq: Two times a day (BID) | ORAL | Status: AC
  Administered 2020-06-22 (×2): 40 meq via ORAL
  Filled 2020-06-22 (×2): qty 2

## 2020-06-22 NOTE — NC FL2 (Signed)
[place] LEVEL OF CARE SCREENING TOOL     IDENTIFICATION  Patient Name: Edward Stafford Birthdate: 03-28-1951 Sex: male Admission Date (Current Location): 06/19/2020  [place] and [place] Number:  Kathleen Argue [id number] [place] and Address:  [facility],  [place], [place]      Provider Number: [id number]  Attending Physician Name and Address:  Kerney Elbe, DO  Relative Name and Phone Number:  [place]   [phone] or Dionel, [place] Relative   [phone]    Current Level of Care: Hospital Recommended Level of Care: [place] Prior Approval Number:    Date Approved/Denied:   PASRR Number: [id number]  Discharge Plan: SNF    Current Diagnoses: Patient Active Problem List   Diagnosis Date Noted  . Malnutrition of moderate degree 06/21/2020  . Neutropenia ([place]) 06/21/2020  . Hypotension 06/20/2020  . B12 deficiency 06/20/2020  . Tachycardia 06/20/2020  . Rhabdomyolysis 06/20/2020  . Hypokalemia 06/20/2020  . Peripheral neuropathy 06/20/2020  . Shuffling gait 06/20/2020  . BPH (benign prostatic hyperplasia) 06/20/2020  . High anion gap metabolic acidosis 58/85/0277  . Recurrent falls 06/20/2020  . Acute kidney injury superimposed on CKD ([place]) 06/19/2020  . Fall at home, initial encounter 06/19/2020  . Prostate cancer ([place]) 08/01/2018  . Elevated PSA 05/13/2018  . PCP NOTES >>> 09/01/2015  . Annual physical exam 07/15/2014  . BMI 38.0-38.9,adult 04/16/2014  . CKD (chronic kidney disease), stage III 04/17/2012  . Dizziness 04/16/2012  . CORONARY ARTERY DISEASE ? 05/17/2010  . SINUS BRADYCARDIA 05/17/2010  . GERD 01/19/2010  . Hyperglycemia 06/08/2008  . Glaucoma 12/19/2007  . HYPERLIPIDEMIA, MIXED 04/19/2007  . Drug abuse-- crack, marijuana 04/19/2007  . HTN (hypertension) 04/19/2007    Orientation RESPIRATION BLADDER Height & Weight     Self, Time, Place  Normal  Incontinent Weight: (!) 226 lb 8 oz (102.7 kg) Height:  6' (182.9 cm)  BEHAVIORAL SYMPTOMS/MOOD NEUROLOGICAL BOWEL NUTRITION STATUS      Incontinent Diet (Regular)  AMBULATORY STATUS COMMUNICATION OF NEEDS Skin   Limited Assist Verbally Normal                       Personal Care Assistance Level of Assistance  Bathing, Feeding, Dressing Bathing Assistance: Limited assistance Feeding assistance: Independent Dressing Assistance: Limited assistance     Functional Limitations Info  Sight, Speech, Hearing Sight Info: Adequate Hearing Info: Adequate Speech Info: Adequate    SPECIAL CARE FACTORS FREQUENCY  PT (By licensed PT), OT (By licensed OT)     PT Frequency: Minimum 5x a week OT Frequency: Minimum 5x a week            Contractures Contractures Info: Not present    Additional Factors Info  Code Status, Allergies, Psychotropic Code Status Info: Full Code Allergies Info: NKA Psychotropic Info: QUEtiapine (SEROQUEL) tablet 25 mg         Current Medications (06/22/2020):  This is the current hospital active medication list Current Facility-Administered Medications  Medication Dose Route Frequency Provider Last Rate Last Admin  . aspirin EC tablet 81 mg  81 mg Oral Daily Harold Hedge, MD   81 mg at 06/22/20 1000  . cyanocobalamin ((VITAMIN B-12)) injection 1,000 mcg  1,000 mcg Intramuscular Daily Oretha Milch D, MD   1,000 mcg at 06/22/20 1002  . feeding supplement (ENSURE ENLIVE) (ENSURE ENLIVE) liquid 237 mL  237 mL Oral TID BM Harold Hedge, MD   237 mL  at 06/21/20 2158  . heparin injection 5,000 Units  5,000 Units Subcutaneous Q8H Harold Hedge, MD   5,000 Units at 06/22/20 [phone]  . hydrALAZINE (APRESOLINE) injection 10 mg  10 mg Intravenous Q6H PRN Harold Hedge, MD      . multivitamin with minerals tablet 1 tablet  1 tablet Oral Daily Harold Hedge, MD   1 tablet at 06/22/20 [phone]  . potassium chloride SA (KLOR-CON) CR tablet 40 mEq  40 mEq Oral BID  Raiford Noble [place], DO   40 mEq at 06/22/20 0959  . pravastatin (PRAVACHOL) tablet 20 mg  20 mg Oral Daily Harold Hedge, MD   20 mg at 06/22/20 0958  . QUEtiapine (SEROQUEL) tablet 25 mg  25 mg Oral QHS Harold Hedge, MD   25 mg at 06/21/20 2158  . sodium chloride flush (NS) 0.9 % injection 3 mL  3 mL Intravenous Q12H Harold Hedge, MD   3 mL at 06/22/20 1000  . tamsulosin (FLOMAX) capsule 0.4 mg  0.4 mg Oral Daily Harold Hedge, MD   0.4 mg at 06/22/20 7591     Discharge Medications: Please see discharge summary for a list of discharge medications.  Relevant Imaging Results:  Relevant Lab Results:   Additional Information SSN [id number]  [name], LCSW

## 2020-06-22 NOTE — Progress Notes (Signed)
PROGRESS NOTE    Edward Stafford  XYI:[id number] DOB: [birth year] DOA: 06/19/2020 PCP: Colon Branch, MD  Brief Narrative:  HPI per [name] on 06/19/20 This is a 69 year old male who is a poor historian with past medical history of chronic back pain, drug abuse, neuropathy, prostate cancer with radioactive seed implantation, recurrent falls, hypertension, hyperlipidemia, CAD s/p stent, former tobacco use, alcohol use reports last drink 1 week ago who presented to [facility] ED on 7/24 with recurrent falls over the past week and difficulty walking.  Patient reports that he had a fall 1 week ago and that he was on the floor for several hours.  Since that time he states that he fell 2 or 3 more times while at home.  Whenever asked about symptoms prior to his fall or what led up to the fall or where he was he simply stated "I fell" but does not provide any further detail.  He currently denies any nausea, vomiting, diarrhea, chest pain, shortness of breath, palpitations, pain anywhere, loss of consciousness or any other issues. He was seen by his PCP, [name], 6/25, who recommended PT and podiatry but the patient declined.   I called the patient's nephew, [name] a radiologist in [place], who provided more information.  States that he is concerned his uncle has been suffering from TIAs and believes his uncle to have been on the floor for 4 hours last week.  States he has been having shuffling gait and has a history of chronic urinary incontinence after his prostate procedure.  Has noticed his uncle's general health declining over the past several months.  States the patient lives with a roommate and takes care of himself and is unsure if he has been compliant with his medications. States that he does not feel his uncle can take care of himself anymore. Also confused nightly.    ED Course: Vitals unremarkable. CT head with severe chronic small vessel ischemic disease but no acute abnormality. Labs most  notable for Cr 2.92 (previously 1.95 in June) and CK 539.   **Interim History Patient continues to be weak but his mental status is improving.  Plan is to be discharged to skilled nursing facility and will need outpatient neurology evaluation.  Anticipate discharge in next 24 to 48 hours once he is obtained insurance authorization.  Repeat Covid testing is still pending  Assessment & Plan:   Principal Problem:   Fall at home, initial encounter Active Problems:   HYPERLIPIDEMIA, MIXED   HTN (hypertension)   SINUS BRADYCARDIA   CKD (chronic kidney disease), stage III   Acute kidney injury superimposed on CKD (HCC)   Hypotension   B12 deficiency   Tachycardia   Rhabdomyolysis   Hypokalemia   Peripheral neuropathy   Shuffling gait   BPH (benign prostatic hyperplasia)   High anion gap metabolic acidosis   Recurrent falls   Malnutrition of moderate degree   Neutropenia (HCC)   Recurrent falls, suspect  symptomatic B12 deficiency affecting gait in setting of possibly undiagnosed dementia given atrophy on MRI scan (questionable vascular dementia or Parkinson's with shuffling gait),.   -Likely additionally worsened by diminished oral intake causing relative hypotension while patient was taking home BP meds in setting of likely cognitive impairment.   -Orthostatic vitals negative but will be repeated, TSH within normal limits, -Will discontinue HCTZ -IM B12 daily x7 days (Day 3 today)  -PT recommends SNF and anticipating D/Cing in the AM when Insurance Authorization has been  obtained  -Will need formal Neurology evaluation in the outpatient setting   Neutropenia, new.  -ANC now 1400.No fevers, no localizing symptoms. New leukopenia this morning.  -repeat CBC with diff in am -if spikes fever start appropriate empiric antibiotics and blood/urine cultures but his WBC is improving slowly and will be closely monitored  Symptomatic B12 deficiency -Patient with shuffling gait, peripheral  neuropathy, cognitive impairment and recurrent falls.  B12 191.  -C/w Daily IM B12 x7 days  Cognitive impairment, high concern for dementia, possibly vascular given MRI imaging, Parkinsons also consideration with shuffling gait.   -Nephew reports significant decline over the past several months prior to admission. -C/w Delirium precautions, high risk for sundowning, C/w Quetiapine 25 mg po qHS -PT recommends SNF, social worker consulted for assistance with placement and patient has a bed offer at [place] and awaiting Insurance Authorization  Relative hypotension and tachycardia, resolved    Suspect related to potential dehydration given improvement with IV fluids and holding home blood pressure medicines.  -TSH within normal limits, orthostatic vitals within normal limits.     -T-max of 100 on admission, does have new leukopenia but no localizing signs or symptoms of infection.  -Will D/C HCTZ -Obtain Blood cultures if becomes febrile  AKI on CKD stage IIIa, improving baseline creatinine of 1.4-1.5, 2.92 on admission, continues to downtrend.   -Suspect prerenal etiology related to diminished oral intake/hypotension and UA with hyaline casts, though does note some proteinuria on UA and elevated CK, need to rule out post renal/intrarenal.  Mild parenchymal atrophy on renal ultrasound, and some degree of bladder distention -Hold home losartan for now but likely can be resumed at discharge; Will stop HCTZ again  -Close monitor ins and outs -Patient is BUN/creatinine went from 39/2.49 is now 22/1.27 -Bladder scan, as needed in and out catheterization if  greater than 250 cc on PVR -Avoid nephrotoxic medications, contrast dyes, hypotension and renally dose medications  -Repeat CMP in a.m.  Rhabdomyolysis, improving.   -In setting of fall.  Has no muscle pain.  CK already downtrending (453) after IVF and will repeat in a.m.  Hypokalemia -Persists no current GI losses.   -Was on a  diuretic (HCTZ) but we will stop this -Replete orally with p.o. potassium chloride 40 mEq twice daily x2 doses -Mag level yesterday was 2.3 but not repeated today neck-continue monitor and replete as necessary Repeat CMP in a.m.  Normocytic anemia, new. -Previous baseline hemoglobin 15, 12.7 this a.m.   -Likely dilution related to IV fluids. -Check anemia panel in the a.m.  -Continue to monitor for signs or symptoms of bleeding; currently no overt bleeding noted -Repeat CBC in a.m.  BPH, with history of prostate cancer.   -Bladder scan yesterday showed retention of 350 cc.   -Monitor intake and output -Bladder scan, as needed in and out catheterization if greater than 250 cc on PVR -Continue home tamsulosin  History of hypertension.   -Now becoming more hypertensive with SBP's in the 150s. -Resumed home HCTZ yesterday but will discontinue again given his risk of dehydration -Holding home losartan in setting of AKI likely can be resumed -He with as needed hydralazine for blood pressure -Blood pressure is acceptable with a BP of 151/86 on last check  Anion gap metabolic acidosis secondary to lactic acidosis, resolved.  -Likely mild lactic acidosis related to relative hypotension in the setting of diminished oral intake -IV fluid hydration has been given  Hyperlipidemia, stable -Continue pravastatin 20 mg p.o. nightly  History of Bradycardia.   -Telemetry was reviewed and has remained in normal sinus rhythm and normal rate -Continue monitor on telemetry  Alcohol use.   -States last drink 1 week ago.  No current signs or symptoms of alcohol withdrawal. -Monitor CIWA protocol  Obesity -Estimated body mass index is 30.72 kg/m as calculated from the following:   Height as of this encounter: 6' (1.829 m).   Weight as of this encounter: 102.7 kg. -Weight Loss and Dietary Counseling given    DVT prophylaxis: Heparin 5,000 units sq q8h Code Status: FULL CODE  Family  Communication: Discussed with his nephew Dr. [name] Disposition Plan: Anticipate discharge to SNF in a.m.  Status is: Inpatient  Remains inpatient appropriate because:Ongoing diagnostic testing needed not appropriate for outpatient work up, Unsafe d/c plan and Inpatient level of care appropriate due to severity of illness   Dispo: The patient is from: Home              Anticipated d/c is to: SNF              Anticipated d/c date is: 1 day              Patient currently is medically stable to d/c.  Consultants:   None  Procedures:  None  Antimicrobials:  Anti-infectives (From admission, onward)   None     Subjective: Seen And examined at bedside and states that he is doing okay but wanted help with being fed.  No chest pain, lightheadedness or dizziness.  No nausea or vomiting.  Denies any concerns or complaints but still feels weak.  No other concerns at this time.  Objective: Vitals:   06/21/20 1516 06/21/20 1926 06/22/20 0450 06/22/20 1308  BP: (!) 141/96 (!) 160/98 (!) 145/96 (!) 151/86  Pulse: 75 64 64 55  Resp: 19 18 18 16   Temp: 98.6 F (37 C) 99 F (37.2 C) 98.8 F (37.1 C) 98.6 F (37 C)  TempSrc: Oral Oral Oral Oral  SpO2: 99% 95% 96% 100%  Weight:      Height:        Intake/Output Summary (Last 24 hours) at 06/22/2020 1837 Last data filed at 06/22/2020 1800 Gross per 24 hour  Intake 480 ml  Output 1225 ml  Net -745 ml   Filed Weights   06/18/20 2049 06/19/20 1546  Weight: (!) 111.1 kg (!) 102.7 kg   Examination: Physical Exam:  Constitutional: WN/WD obese AAM in NAD and appears calm but does appear slightly uncomfortable.   Eyes: Lids and conjunctivae normal, sclerae anicteric  ENMT: External Ears, Nose appear normal. Grossly normal hearing.  Neck: Appears normal, supple, no cervical masses, normal ROM, no appreciable thyromegaly: No JVD Respiratory: Diminished to auscultation bilaterally, no wheezing, rales, rhonchi or crackles. Normal  respiratory effort and patient is not tachypenic. No accessory muscle use.  Unlabored breathing Cardiovascular: RRR, no murmurs / rubs / gallops. S1 and S2 auscultated.  Minimal extremity edema abdomen: Soft, non-tender, slightly distended secondary body habitus. Bowel sounds positive.  GU: Deferred. Musculoskeletal: No clubbing / cyanosis of digits/nails. No joint deformity upper and lower extremities.  Skin: No rashes, lesions, ulcers on limited skin evaluation. No induration; Warm and dry.  Neurologic: CN 2-12 grossly intact with no focal deficits. Romberg sign and cerebellar reflexes not assessed.  Psychiatric: Normal judgment and insight. Alert and oriented x 3. Normal mood and appropriate affect.   Data Reviewed: I have personally reviewed following labs and imaging studies  CBC: Recent Labs  Lab 06/19/20 0744 06/19/20 0744 06/20/20 0516 06/20/20 1345 06/21/20 0512 06/21/20 0816 06/22/20 0355  WBC 6.7   < > 5.1 4.2 2.8* 2.6* 3.1*  NEUTROABS 5.8  --   --   --  1.3* 1.2* 1.4*  HGB 15.1   < > 12.7* 12.0* 11.5* 11.9* 11.8*  HCT 47.0   < > 38.2* 36.2* 35.6* 36.1* 35.3*  MCV 89.4   < > 88.0 87.7 89.4 88.9 87.2  PLT 296   < > 252 209 189 214 215   < > = values in this interval not displayed.   Basic Metabolic Panel: Recent Labs  Lab 06/19/20 0744 06/20/20 0516 06/20/20 1345 06/21/20 0512 06/22/20 0355  NA 136 135  --  134* 135  K 3.7 3.4*  --  3.1* 3.3*  CL 97* 99  --  100 100  CO2 21* 25  --  26 25  GLUCOSE 122* 90  --  93 96  BUN 32* 39*  --  30* 22  CREATININE 2.92* 2.49*  --  1.78* 1.27*  CALCIUM 9.3 8.4*  --  8.1* 8.3*  MG  --   --  2.6* 2.3  --    GFR: Estimated Creatinine Clearance: 69 mL/min (A) (by C-G formula based on SCr of 1.27 mg/dL (H)). Liver Function Tests: Recent Labs  Lab 06/19/20 0744 06/22/20 0355  AST 41 24  ALT 28 22  ALKPHOS 85 64  BILITOT 1.3* 0.6  PROT 8.3* 6.2*  ALBUMIN 4.2 3.1*   No results for input(s): LIPASE, AMYLASE in the last  168 hours. No results for input(s): AMMONIA in the last 168 hours. Coagulation Profile: No results for input(s): INR, PROTIME in the last 168 hours. Cardiac Enzymes: Recent Labs  Lab 06/19/20 0811 06/20/20 0516  CKTOTAL 539* 453*   BNP (last 3 results) No results for input(s): PROBNP in the last 8760 hours. HbA1C: No results for input(s): HGBA1C in the last 72 hours. CBG: No results for input(s): GLUCAP in the last 168 hours. Lipid Profile: No results for input(s): CHOL, HDL, LDLCALC, TRIG, CHOLHDL, LDLDIRECT in the last 72 hours. Thyroid Function Tests: Recent Labs    06/20/20 1345  TSH 0.888   Anemia Panel: No results for input(s): VITAMINB12, FOLATE, FERRITIN, TIBC, IRON, RETICCTPCT in the last 72 hours. Sepsis Labs: Recent Labs  Lab 06/19/20 1621 06/19/20 1921  LATICACIDVEN 2.4* 1.9    Recent Results (from the past 240 hour(s))  SARS Coronavirus 2 by RT PCR (hospital order, performed in [facility] hospital lab) Nasopharyngeal Nasopharyngeal Swab     Status: None   Collection Time: 06/19/20  1:27 PM   Specimen: Nasopharyngeal Swab  Result Value Ref Range Status   SARS Coronavirus 2 NEGATIVE NEGATIVE Final    Comment: (NOTE) SARS-CoV-2 target nucleic acids are NOT DETECTED.  The SARS-CoV-2 RNA is generally detectable in upper and lower respiratory specimens during the acute phase of infection. The lowest concentration of SARS-CoV-2 viral copies this assay can detect is 250 copies / mL. A negative result does not preclude SARS-CoV-2 infection and should not be used as the sole basis for treatment or other patient management decisions.  A negative result may occur with improper specimen collection / handling, submission of specimen other than nasopharyngeal swab, presence of viral mutation(s) within the areas targeted by this assay, and inadequate number of viral copies (<250 copies / mL). A negative result must be combined with clinical observations, patient  history, and  epidemiological information.  Fact Sheet for Patients:   StrictlyIdeas.no  Fact Sheet for Healthcare Providers: BankingDealers.co.za  This test is not yet approved or  cleared by the [place] FDA and has been authorized for detection and/or diagnosis of SARS-CoV-2 by FDA under an Emergency Use Authorization (EUA).  This EUA will remain in effect (meaning this test can be used) for the duration of the COVID-19 declaration under Section 564(b)(1) of the Act, 21 U.S.C. section 360bbb-3(b)(1), unless the authorization is terminated or revoked sooner.  Performed at [facility], [place] [address], [place], [place] [address]      RN Pressure Injury Documentation:     Estimated body mass index is 30.72 kg/m as calculated from the following:   Height as of this encounter: 6' (1.829 m).   Weight as of this encounter: 102.7 kg.  Malnutrition Type:  Nutrition Problem: Moderate Malnutrition Etiology: chronic illness (suspected undiagnosed dementia)   Malnutrition Characteristics:  Signs/Symptoms: energy intake < or equal to 75% for > or equal to 1 month, mild muscle depletion, mild fat depletion, moderate fat depletion, moderate muscle depletion, percent weight loss Percent weight loss: 20 %   Nutrition Interventions:  Interventions: Ensure Enlive (each supplement provides 350kcal and 20 grams of protein), MVI, Magic cup    Radiology Studies: No results found.  Scheduled Meds: . aspirin EC  81 mg Oral Daily  . cyanocobalamin  1,000 mcg Intramuscular Daily  . feeding supplement (ENSURE ENLIVE)  237 mL Oral TID BM  . heparin  5,000 Units Subcutaneous Q8H  . multivitamin with minerals  1 tablet Oral Daily  . potassium chloride  40 mEq Oral BID  . pravastatin  20 mg Oral Daily  . QUEtiapine  25 mg Oral QHS  . sodium chloride flush  3 mL Intravenous Q12H  . tamsulosin  0.4 mg Oral Daily    Continuous Infusions:   LOS: 2 days   Kerney Elbe, DO [facility] PAGER is on AMION  If 7PM-7AM, please contact night-coverage [email or web address]

## 2020-06-22 NOTE — TOC Initial Note (Addendum)
Transition of Care [facility]) - Initial/Assessment Note    Patient Details  Name: Edward Stafford MRN: [id number] Date of Birth: 15-Mar-1951  [facility] CM/SW Contact:    [name], LCSW Phone Number: 06/22/2020, 5:12 PM  Clinical Narrative:                  Patient is a 69 year old male who is alert and oriented x3.  Patient lives alone in an apartment.  Patient has history of alcohol use, but has not drank for over a week.  CSW spoke to patient to discuss SNF placement, he deferred decision making to his family member [name].  CSW discussed with patient how insurance will pay for stay and what to expect at SNF.  CSW then called patient's family member and explained to him the same thing.  CSW presented bed offers to patient's family member and they chose [facility] for short term rehab.  CSW contacted [facility] and they will let CSW know if there is a bed availabe, once it has been determined if patient received Covid Vaccine or not.  CSW also sent clinical information for insurance preapproval for him to go to SNF.  CSW awaiting decision by insurance company reference number is [id number].  CSW to continue to follow patient's progress throughout discharge planning.  6:40pm Per patient's family member he has received his Covid Vaccines in [facility] at the [facility].  Expected Discharge Plan: Skilled Nursing Facility Barriers to Discharge: Insurance Authorization   Patient Goals and CMS Choice Patient states their goals for this hospitalization and ongoing recovery are:: To go to SNF for short term rehab, then return back home. CMS Medicare.gov Compare Post Acute Care list provided to:: Patient Represenative (must comment) Choice offered to / list presented to : [facility] POA / Guardian [name], patient's family member.)  Expected Discharge Plan and Services Expected Discharge Plan: [place] In-house Referral: Clinical Social Work   Post  Acute Care Choice: [place] Living arrangements for the past 2 months: Apartment                   DME Agency: NA       HH Arranged: NA          Prior Living Arrangements/Services Living arrangements for the past 2 months: Apartment Lives with:: Self Patient language and need for interpreter reviewed:: Yes Do you feel safe going back to the place where you live?: No   Patient and family feels he needs rehab first before he is able to return back home.  Need for Family Participation in Patient Care: Yes (Comment) Care giver support system in place?: No (comment)   Criminal Activity/Legal Involvement Pertinent to Current Situation/Hospitalization: No - Comment as needed  Activities of Daily Living Home Assistive Devices/Equipment: Eyeglasses ADL Screening (condition at time of admission) Patient's cognitive ability adequate to safely complete daily activities?: Yes Is the patient deaf or have difficulty hearing?: No Does the patient have difficulty seeing, even when wearing glasses/contacts?: No Does the patient have difficulty concentrating, remembering, or making decisions?: No Patient able to express need for assistance with ADLs?: Yes Does the patient have difficulty dressing or bathing?: No Independently performs ADLs?: No Communication: Independent Dressing (OT): Independent Grooming: Independent Feeding: Independent Bathing: Independent Toileting: Dependent Is this a change from baseline?: Change from baseline, expected to last >3days In/Out Bed: Dependent Is this a change from baseline?: Change from baseline, expected to last >3  days Walks in Home: Dependent Is this a change from baseline?: Change from baseline, expected to last >3 days Does the patient have difficulty walking or climbing stairs?: Yes (secondary to pain) Weakness of Legs: Both Weakness of Arms/Hands: None  Permission Sought/Granted Permission sought to share information with :  Facility Sport and exercise psychologist, Family Supports, Case Manager Permission granted to share information with : Yes, Verbal Permission Granted  Share Information with NAME: [name], [name] Relative   [phone] or House,[name] Relative   [phone]  Permission granted to share info w AGENCY: SNF admissions        Emotional Assessment Appearance:: Appears stated age Attitude/Demeanor/Rapport: Engaged Affect (typically observed): Accepting, Appropriate, Stable Orientation: : Oriented to Self, Oriented to Place, Oriented to  Time Alcohol / Substance Use: Alcohol Use Psych Involvement: No (comment)  Admission diagnosis:  Weakness [R53.1] AKI (acute kidney injury) ([place]) [N17.9] Fall, initial encounter [W19.XXXA] Recurrent falls [R29.6] Patient Active Problem List   Diagnosis Date Noted  . Malnutrition of moderate degree 06/21/2020  . Neutropenia ([place]) 06/21/2020  . Hypotension 06/20/2020  . B12 deficiency 06/20/2020  . Tachycardia 06/20/2020  . Rhabdomyolysis 06/20/2020  . Hypokalemia 06/20/2020  . Peripheral neuropathy 06/20/2020  . Shuffling gait 06/20/2020  . BPH (benign prostatic hyperplasia) 06/20/2020  . High anion gap metabolic acidosis 29/56/2130  . Recurrent falls 06/20/2020  . Acute kidney injury superimposed on CKD ([employer]) 06/19/2020  . Fall at home, initial encounter 06/19/2020  . Prostate cancer ([place]) 08/01/2018  . Elevated PSA 05/13/2018  . PCP NOTES >>> 09/01/2015  . Annual physical exam 07/15/2014  . BMI 38.0-38.9,adult 04/16/2014  . CKD (chronic kidney disease), stage III 04/17/2012  . Dizziness 04/16/2012  . CORONARY ARTERY DISEASE ? 05/17/2010  . SINUS BRADYCARDIA 05/17/2010  . GERD 01/19/2010  . Hyperglycemia 06/08/2008  . Glaucoma 12/19/2007  . HYPERLIPIDEMIA, MIXED 04/19/2007  . Drug abuse-- crack, marijuana 04/19/2007  . HTN (hypertension) 04/19/2007   PCP:  Colon Branch, MD Pharmacy:   [id number] - [place], [place] [address] Phone: [phone] Fax: [phone]     Social Determinants of Health (SDOH) Interventions    Readmission Risk Interventions No flowsheet data found.

## 2020-06-22 NOTE — Plan of Care (Signed)

## 2020-06-23 LAB — MAGNESIUM: Magnesium: 2.2 mg/dL (ref 1.7–2.4)

## 2020-06-23 LAB — COMPREHENSIVE METABOLIC PANEL
ALT: 23 U/L (ref 0–44)
AST: 28 U/L (ref 15–41)
Albumin: 3.2 g/dL — ABNORMAL LOW (ref 3.5–5.0)
Alkaline Phosphatase: 66 U/L (ref 38–126)
Anion gap: 8 (ref 5–15)
BUN: 17 mg/dL (ref 8–23)
CO2: 26 mmol/L (ref 22–32)
Calcium: 8.7 mg/dL — ABNORMAL LOW (ref 8.9–10.3)
Chloride: 102 mmol/L (ref 98–111)
Creatinine, Ser: 1.16 mg/dL (ref 0.61–1.24)
GFR calc Af Amer: >60 mL/min (ref >60)
GFR calc non Af Amer: >60 mL/min (ref >60)
Glucose, Bld: 93 mg/dL (ref 70–99)
Potassium: 3.9 mmol/L (ref 3.5–5.1)
Sodium: 136 mmol/L (ref 135–145)
Total Bilirubin: 0.7 mg/dL (ref 0.3–1.2)
Total Protein: 6.4 g/dL — ABNORMAL LOW (ref 6.5–8.1)

## 2020-06-23 LAB — CBC WITH DIFFERENTIAL/PLATELET
Abs Immature Granulocytes: 0.01 10*3/uL (ref 0.00–0.07)
Basophils Absolute: 0 10*3/uL (ref 0.0–0.1)
Basophils Relative: 1 %
Eosinophils Absolute: 0.4 10*3/uL (ref 0.0–0.5)
Eosinophils Relative: 12 %
HCT: 36.7 % — ABNORMAL LOW (ref 39.0–52.0)
Hemoglobin: 12 g/dL — ABNORMAL LOW (ref 13.0–17.0)
Immature Granulocytes: 0 %
Lymphocytes Relative: 37 %
Lymphs Abs: 1.3 10*3/uL (ref 0.7–4.0)
MCH: 28.6 pg (ref 26.0–34.0)
MCHC: 32.7 g/dL (ref 30.0–36.0)
MCV: 87.6 fL (ref 80.0–100.0)
Monocytes Absolute: 0.5 10*3/uL (ref 0.1–1.0)
Monocytes Relative: 14 %
Neutro Abs: 1.3 10*3/uL — ABNORMAL LOW (ref 1.7–7.7)
Neutrophils Relative %: 36 %
Platelets: 230 10*3/uL (ref 150–400)
RBC: 4.19 MIL/uL — ABNORMAL LOW (ref 4.22–5.81)
RDW: 12.9 % (ref 11.5–15.5)
WBC: 3.5 10*3/uL — ABNORMAL LOW (ref 4.0–10.5)
nRBC: 0 % (ref 0.0–0.2)

## 2020-06-23 LAB — CK: Total CK: 221 U/L (ref 49–397)

## 2020-06-23 LAB — SARS CORONAVIRUS 2 BY RT PCR (HOSPITAL ORDER, PERFORMED IN ~~LOC~~ HOSPITAL LAB): SARS Coronavirus 2: NEGATIVE

## 2020-06-23 LAB — PHOSPHORUS: Phosphorus: 3.5 mg/dL (ref 2.5–4.6)

## 2020-06-23 MED ORDER — ENSURE ENLIVE PO LIQD: 237.0000 mL | Freq: Three times a day (TID) | ORAL | 12 refills | Status: AC

## 2020-06-23 MED ORDER — CYANOCOBALAMIN 1000 MCG/ML IJ SOLN: 1000.0000 ug | Freq: Every day | INTRAMUSCULAR | 0 refills | Status: AC

## 2020-06-23 MED ORDER — ADULT MULTIVITAMIN W/MINERALS CH: 1.0000 | ORAL_TABLET | Freq: Every day | ORAL | 0 refills | Status: AC

## 2020-06-23 MED ORDER — QUETIAPINE FUMARATE 25 MG PO TABS: 25.0000 mg | ORAL_TABLET | Freq: Every day | ORAL | 0 refills | Status: AC

## 2020-06-23 NOTE — Plan of Care (Signed)
  Problem: Education: Goal: Knowledge of General Education information will improve Description: Including pain rating scale, medication(s)/side effects and non-pharmacologic comfort measures 06/23/2020 0903 by Meriel Pica, RN Outcome: Progressing 06/22/2020 2122 by Meriel Pica, RN Outcome: Progressing   Problem: Health Behavior/Discharge Planning: Goal: Ability to manage health-related needs will improve 06/23/2020 0903 by Meriel Pica, RN Outcome: Progressing 06/22/2020 2122 by Meriel Pica, RN Outcome: Progressing   Problem: Clinical Measurements: Goal: Ability to maintain clinical measurements within normal limits will improve 06/23/2020 0903 by Meriel Pica, RN Outcome: Progressing 06/22/2020 2122 by Meriel Pica, RN Outcome: Progressing Goal: Will remain free from infection 06/23/2020 0903 by Meriel Pica, RN Outcome: Progressing 06/22/2020 2122 by Meriel Pica, RN Outcome: Progressing Goal: Diagnostic test results will improve 06/23/2020 0903 by Meriel Pica, RN Outcome: Progressing 06/22/2020 2122 by Meriel Pica, RN Outcome: Progressing Goal: Respiratory complications will improve 06/23/2020 0903 by Meriel Pica, RN Outcome: Progressing 06/22/2020 2122 by Meriel Pica, RN Outcome: Progressing Goal: Cardiovascular complication will be avoided 06/23/2020 0903 by Meriel Pica, RN Outcome: Progressing 06/22/2020 2122 by Meriel Pica, RN Outcome: Progressing   Problem: Activity: Goal: Risk for activity intolerance will decrease 06/23/2020 0903 by Meriel Pica, RN Outcome: Progressing 06/22/2020 2122 by Meriel Pica, RN Outcome: Progressing   Problem: Nutrition: Goal: Adequate nutrition will be maintained 06/23/2020 0903 by Meriel Pica, RN Outcome: Progressing 06/22/2020 2122 by Meriel Pica, RN Outcome: Progressing   Problem: Coping: Goal: Level of anxiety will decrease 06/23/2020 0903 by Meriel Pica, RN Outcome: Progressing 06/22/2020 2122 by Meriel Pica, RN Outcome: Progressing   Problem: Elimination: Goal: Will not experience complications related to bowel motility 06/23/2020 0903 by Meriel Pica, RN Outcome: Progressing 06/22/2020 2122 by Meriel Pica, RN Outcome: Progressing Goal: Will not experience complications related to urinary retention 06/23/2020 0903 by Meriel Pica, RN Outcome: Progressing 06/22/2020 2122 by Meriel Pica, RN Outcome: Progressing   Problem: Pain Managment: Goal: General experience of comfort will improve 06/23/2020 0903 by Meriel Pica, RN Outcome: Progressing 06/22/2020 2122 by Meriel Pica, RN Outcome: Progressing   Problem: Safety: Goal: Ability to remain free from injury will improve 06/23/2020 0903 by Meriel Pica, RN Outcome: Progressing 06/22/2020 2122 by Meriel Pica, RN Outcome: Progressing   Problem: Skin Integrity: Goal: Risk for impaired skin integrity will decrease 06/23/2020 0903 by Meriel Pica, RN Outcome: Progressing 06/22/2020 2122 by Meriel Pica, RN Outcome: Progressing

## 2020-06-23 NOTE — Discharge Summary (Signed)
Physician Discharge Summary  Edward Stafford UYQ:[id number] DOB: March 15, 1951 DOA: 06/19/2020  PCP: Edward Branch, MD  Admit date: 06/19/2020 Discharge date: 06/23/2020  Admitted From: Home Disposition: SNF  Recommendations for Outpatient Follow-up:  1. Follow up with PCP in 1-2 weeks 2. Follow up with Neurology in the outpatient setting; Ambulatory Referral has been made 3. Please obtain CMP/CBC, Mag, Phos in one week 4. Please follow up on the following pending results:  Home Health: No Equipment/Devices: None    Discharge Condition: Stable  CODE STATUS: FULL CODE Diet recommendation: Heart Healthy Diet   Brief/Interim Summary: HPI per Dr. Marva Stafford on 06/19/20 This is a 69 year old malewho is a poor historianwith past medical history of chronic back pain, drug abuse,neuropathy,prostate cancer with radioactive seed implantation,recurrent falls,hypertension, hyperlipidemia, CAD s/p stent, former tobacco use,alcohol use reports last drink 1 week ago who presented to [facility] ED on 7/24with recurrent falls over the past week and difficulty walking.  Patient reports that he had a fall 1 week ago and that he was on the floor for several hours. Since that time he states that he fell 2 or 3 more times while at home. Whenever asked about symptoms prior to his fall or what led up to the fall or where he was he simply stated "I fell" but does not provide any further detail. He currently denies any nausea, vomiting, diarrhea, chest pain, shortness of breath, palpitations, pain anywhere, loss of consciousness or any other issues. He was seen by his PCP, Dr. Larose Stafford, 6/25, who recommended PT and podiatry but the patient declined.  I called the patient's nephew, Edward Stafford a radiologist in [place], who provided more information. States that he is concerned his uncle has been suffering from TIAs and believes his uncle to have been on the floor for 4 hours last week. States he has been having  shuffling gait and has a history of chronic urinary incontinence after his prostate procedure. Has noticed his uncle's general health declining over the past several months. States the patient lives with a roommate and takes care of himself and is unsure if he has been compliant with his medications. States that he does not feel his uncle can take care of himself anymore. Also confused nightly.  ED Course:Vitals unremarkable. CT head with severe chronic small vessel ischemic disease but no acute abnormality. Labs most notable for Cr 2.92 (previously 1.95 in June) and CK 539.  **Interim History Patient continues to be weak but his mental status is improving and his laboratory data is much improved.  Plan is to be discharged to skilled nursing facility and will need outpatient neurology evaluation. Repeat Covid testing is negative and patient has been vaccinated. He is medically stable to D/C to SNF now.   Discharge Diagnoses:  Principal Problem:   Fall at home, initial encounter Active Problems:   HYPERLIPIDEMIA, MIXED   HTN (hypertension)   SINUS BRADYCARDIA   CKD (chronic kidney disease), stage III   Acute kidney injury superimposed on CKD (HCC)   Hypotension   B12 deficiency   Tachycardia   Rhabdomyolysis   Hypokalemia   Peripheral neuropathy   Shuffling gait   BPH (benign prostatic hyperplasia)   High anion gap metabolic acidosis   Recurrent falls   Malnutrition of moderate degree   Neutropenia (HCC)  Recurrent falls, suspect symptomatic B12 deficiency affecting gait in setting of possibly undiagnosed dementia given atrophy on MRI scan (questionable vascular dementia or Parkinson's with shuffling gait),. -Likely additionally worsened  by diminished oral intake causing relative hypotension while patient was taking home BP meds in setting of likely cognitive impairment.  -Orthostatic vitals negative but patient having a hard time standing due to his weakness, TSH within  normal limits, -Will discontinue HCTZ -IM B12 daily x7 days (Day 4 today)  -PT recommends SNF and anticipating D/Cing in the AM when Insurance Authorization has been obtained  -Will need formal Neurology evaluation in the outpatient setting and Ambulatory referral has been made  Neutropenia, new.  -ANC now 1300.No fevers, no localizing symptoms. WBC is 3.5 -repeat CBC with diff within 1 week  -if spikes fever start appropriate empiric antibiotics and blood/urine culturesbut his WBC is improving slowly and will be closely monitored  Symptomatic B12 deficiency -Patient with shuffling gait, peripheral neuropathy, cognitive impairment and recurrent falls. B12 191.  -C/w Daily IM B12 x7 days -Follow up with Neurology in the outpatient setting   Cognitive impairment, high concern for dementia, possibly vascular given MRI imaging, Parkinsons also consideration with shuffling gait. -Nephew reportssignificant decline over the past several months prior to admission. -Will need outpatient neurological work-up and Ambulatory Referral has been made  -C/w Delirium precautions, high risk for sundowning, C/w Quetiapine 25 mg po qHS -PT recommends SNF, social worker consulted for assistance with placement and patient has a bed offer at [facility] and awaiting Insurance Authorization  Relative hypotension and tachycardia, resolved Suspect related to potential dehydrationgiven improvement with IV fluids and holding home blood pressure medicines.  -TSH within normal limits, orthostatic vitals within normal limits.  -T-max of 100 on admission, does have new leukopenia but no localizing signs or symptoms of infection. -Will D/C HCTZ at discharge -Obtain Blood cultures if becomes febrile  AKI on CKD stage IIIa, improving baseline creatinine of 1.4-1.5, 2.92 on admission, continues to downtrend.  -Suspect prerenal etiology related to diminished oral intake/hypotension and UA with hyaline  casts,though does note some proteinuria on UA and elevated CK,need to rule out post renal/intrarenal. Mild parenchymal atrophy on renal ultrasound, and some degree of bladder distention -Hold home losartan for now but likely can be resumed at discharge; Will stop HCTZ again  -Close monitor ins and outs -Patient is BUN/creatinine went from 39/2.49 is now  17/1.16 and improved -Bladder scan, as needed in and out catheterization if greater than 250 cc on PVR -Avoid nephrotoxic medications, contrast dyes, hypotension and renally dose medications  -Repeat CMP in a.m.  Rhabdomyolysis, improving. -In setting of fall. Has no muscle pain. CK already downtrending  and resolved (453 and improved to 221) after IVF and will repeat in a.m.  Hypokalemia -Persists no current GI losses. -Was on a diuretic (HCTZ) but we will stop this and K+ subsequently improved and is now 3.9 -Replete orally with p.o. potassium chloride 40 mEq twice daily x2 doses as an outpatient  -Mag level today is 2.2 -continue monitor and replete as necessary Repeat CMP in a.m.  Normocytic anemia, new. -Previous baseline hemoglobin 15 -Today his hemoglobin/hematocrit is now 12.0/36.7 -Likely dilution related to IV fluids. -Check anemia panel in the outpatient setting .  -Continue to monitor for signs or symptoms of bleeding; currently no overt bleeding noted -Repeat CBC in a.m.  BPH,with history of prostate cancer.  -Bladder scan the day before yesterday showed retention of 350 cc. -Monitor intake and output -Bladder scan, as needed in and out catheterization if greater than 250 cc on PVR -Continue home Tamsulosin at D/C  History of hypertension. -Now becoming more hypertensive with SBP's  in the 150s. -Resumed home HCTZ yesterday but will discontinue again given his risk of dehydration -Holding home losartan in setting of AKI likely can be resumed -He with as needed hydralazine for blood  pressure -Blood pressure is acceptable with a BP of 140/83 on last check  Anion gap metabolic acidosis secondary to lactic acidosis, resolved.  -Likely mild lactic acidosis related to relative hypotension in the setting of diminished oral intake -IV fluid hydration has been given -Patient's CO2 is 26, anion gap is 8, chloride level is 102 now  Hyperlipidemia, stable -Continue Pravastatin 20 mg p.o. nightly  History of Bradycardia.  -Telemetry was reviewed and has remained in normal sinus rhythm and normal rate -Continue monitor on telemetry while hospitalized   Alcohol use.  -States last drink 1 week ago. No current signs or symptoms of alcohol withdrawal. -Monitor CIWA protocol  Obesity -Estimated body mass index is 30.72 kg/m as calculated from the following:   Height as of this encounter: 6' (1.829 m).   Weight as of this encounter: 102.7 kg. -Weight Loss and Dietary Counseling given   Discharge Instructions  Discharge Instructions    Ambulatory referral to Neurology   Complete by: As directed    An appointment is requested in approximately: 1-2 weeks for Cognitive Impairment with Concern for Dementia   Call MD for:  difficulty breathing, headache or visual disturbances   Complete by: As directed    Call MD for:  extreme fatigue   Complete by: As directed    Call MD for:  hives   Complete by: As directed    Call MD for:  persistant dizziness or light-headedness   Complete by: As directed    Call MD for:  persistant nausea and vomiting   Complete by: As directed    Call MD for:  redness, tenderness, or signs of infection (pain, swelling, redness, odor or green/yellow discharge around incision site)   Complete by: As directed    Call MD for:  severe uncontrolled pain   Complete by: As directed    Call MD for:  temperature >100.4   Complete by: As directed    Diet - low sodium heart healthy   Complete by: As directed    Discharge instructions   Complete by:  As directed    You were cared for by a hospitalist during your hospital stay. If you have any questions about your discharge medications or the care you received while you were in the hospital after you are discharged, you can call the unit and ask to speak with the hospitalist on call if the hospitalist that took care of you is not available. Once you are discharged, your primary care physician will handle any further medical issues. Please note that NO REFILLS for any discharge medications will be authorized once you are discharged, as it is imperative that you return to your primary care physician (or establish a relationship with a primary care physician if you do not have one) for your aftercare needs so that they can reassess your need for medications and monitor your lab values.  Follow up with PCP and Neurology in the outpatient setting. Take all medications as prescribed. If symptoms change or worsen please return to the ED for evaluation   Increase activity slowly   Complete by: As directed      Allergies as of 06/23/2020   No Known Allergies     Medication List    STOP taking these medications   hydrochlorothiazide  25 MG tablet Commonly known as: HYDRODIURIL     TAKE these medications   aspirin EC 81 MG tablet Take 81 mg by mouth daily.   cyanocobalamin 1000 MCG/ML injection Commonly known as: (VITAMIN B-12) Inject 1 mL (1,000 mcg total) into the muscle daily for 4 days.   feeding supplement (ENSURE ENLIVE) Liqd Take 237 mLs by mouth 3 (three) times daily between meals.   losartan 25 MG tablet Commonly known as: COZAAR Take 1 tablet (25 mg total) by mouth every evening.   multivitamin with minerals Tabs tablet Take 1 tablet by mouth daily.   pravastatin 20 MG tablet Commonly known as: PRAVACHOL Take 1 tablet (20 mg total) by mouth daily.   QUEtiapine 25 MG tablet Commonly known as: SEROQUEL Take 1 tablet (25 mg total) by mouth at bedtime.   tamsulosin 0.4 MG  Caps capsule Commonly known as: FLOMAX Take 0.4 mg by mouth daily.       Follow-up Information    Edward Branch, MD. Call.   Specialty: Internal Medicine Why: Follow up within 1-2 weeks Contact information: [address] [facility] [address] [place] [address] [phone]              No Known Allergies  Consultations:  None  Procedures/Studies: CT Head Wo Contrast  Result Date: 06/19/2020 CLINICAL DATA:  Weakness status post fall. EXAM: CT HEAD WITHOUT CONTRAST TECHNIQUE: Contiguous axial images were obtained from the base of the skull through the vertex without intravenous contrast. COMPARISON:  04/17/2012 FINDINGS: Brain: There is no evidence of acute infarct, intracranial hemorrhage, mass, midline shift, or extra-axial fluid collection. Confluent hypodensities in the cerebral white matter bilaterally are nonspecific but compatible with severe chronic small vessel ischemic disease, stable to slightly progressed from the prior CT. There is a chronic lacunar infarct in the right thalamus. Vascular: Calcified atherosclerosis at the skull base. No hyperdense vessel. Skull: No fracture or suspicious osseous lesion. Sinuses/Orbits: Visualized paranasal sinuses and mastoid air cells are clear. Unremarkable orbits. Other: None. IMPRESSION: 1. No evidence of acute intracranial abnormality. 2. Severe chronic small vessel ischemic disease. Electronically Signed   By: Logan Bores M.D.   On: 06/19/2020 08:40   MR BRAIN WO CONTRAST  Result Date: 06/19/2020 CLINICAL DATA:  Neuro deficit.  Stroke suspected. EXAM: MRI HEAD WITHOUT CONTRAST TECHNIQUE: Multiplanar, multiecho pulse sequences of the brain and surrounding structures were obtained without intravenous contrast. COMPARISON:  CT head without contrast 06/19/2020. FINDINGS: Brain: Confluent periventricular and subcortical T2 hyperintensities are present bilaterally. No acute infarct, hemorrhage, or mass lesion is present. Remote  lacunar infarcts are present within the thalami bilaterally. White matter changes extend into the brainstem. A remote lacunar infarct is present in the left cerebellum. Vascular: Flow is present in the major intracranial arteries. Skull and upper cervical spine: Degenerative changes are present at C1-2. Craniocervical junction is within normal limits. Midline structures are unremarkable. Sinuses/Orbits: The paranasal sinuses and mastoid air cells are clear. The globes and orbits are within normal limits. IMPRESSION: 1. No acute intracranial abnormality. 2. Confluent periventricular and subcortical T2 hyperintensities bilaterally are advanced for age. This likely reflects the sequela of chronic microvascular ischemia. 3. Remote lacunar infarcts of the thalami bilaterally and left cerebellum. Electronically Signed   By: San Morelle M.D.   On: 06/19/2020 15:37   US RENAL  Result Date: 06/20/2020 CLINICAL DATA:  69 year old male with acute on chronic renal insufficiency. EXAM: RENAL / URINARY TRACT ULTRASOUND COMPLETE COMPARISON:  None.  FINDINGS: Right Kidney: Renal measurements: 12.1 x 4.6 x 5.2 cm = volume: 151 mL. Mild parenchyma atrophy. Normal echogenicity. No hydronephrosis or shadowing stone. Left Kidney: Renal measurements: 13.4 x 5.7 x 5.0 cm = volume: 199 mL. Normal echogenicity. Mild parenchyma atrophy. No hydronephrosis or shadowing stone. Bladder: Appears normal for degree of bladder distention. Other: None. IMPRESSION: Mild parenchyma atrophy, otherwise unremarkable renal ultrasound. Electronically Signed   By: Anner Crete M.D.   On: 06/20/2020 17:28    Subjective: Examined at bedside he had no complaints.  Still felt weak.  No nausea or vomiting.  No other concerns or complaints at this time.  He is currently medically stable to be discharged at this time will need to follow-up with PCP and neurology in outpatient setting and he understands agrees with the plan of care.  Discharge  Exam: Vitals:   06/23/20 0436 06/23/20 0652  BP: (!) 194/98 (!) 140/83  Pulse: 53 56  Resp: 16 16  Temp: 98.2 F (36.8 C)   SpO2: 100%    Vitals:   06/22/20 1308 06/22/20 1919 06/23/20 0436 06/23/20 0652  BP: (!) 151/86 (!) 145/85 (!) 194/98 (!) 140/83  Pulse: 55 70 53 56  Resp: 16 16 16 16   Temp: 98.6 F (37 C) 98.7 F (37.1 C) 98.2 F (36.8 C)   TempSrc: Oral Oral Oral   SpO2: 100% 99% 100%   Weight:      Height:       General: Pt is alert, awake, not in acute distress Cardiovascular: RRR, S1/S2 +, no rubs, no gallops Respiratory: Diminished bilaterally, no wheezing, no rhonchi Abdominal: Soft, NT, Distended 2/2 to body habitus, bowel sounds + Extremities: no edema, no cyanosis  The results of significant diagnostics from this hospitalization (including imaging, microbiology, ancillary and laboratory) are listed below for reference.    Microbiology: Recent Results (from the past 240 hour(s))  SARS Coronavirus 2 by RT PCR (hospital order, performed in [facility] hospital lab) Nasopharyngeal Nasopharyngeal Swab     Status: None   Collection Time: 06/19/20  1:27 PM   Specimen: Nasopharyngeal Swab  Result Value Ref Range Status   SARS Coronavirus 2 NEGATIVE NEGATIVE Final    Comment: (NOTE) SARS-CoV-2 target nucleic acids are NOT DETECTED.  The SARS-CoV-2 RNA is generally detectable in upper and lower respiratory specimens during the acute phase of infection. The lowest concentration of SARS-CoV-2 viral copies this assay can detect is 250 copies / mL. A negative result does not preclude SARS-CoV-2 infection and should not be used as the sole basis for treatment or other patient management decisions.  A negative result may occur with improper specimen collection / handling, submission of specimen other than nasopharyngeal swab, presence of viral mutation(s) within the areas targeted by this assay, and inadequate number of viral copies (<250 copies / mL). A negative  result must be combined with clinical observations, patient history, and epidemiological information.  Fact Sheet for Patients:   StrictlyIdeas.no  Fact Sheet for Healthcare Providers: BankingDealers.co.za  This test is not yet approved or  cleared by the [place] FDA and has been authorized for detection and/or diagnosis of SARS-CoV-2 by FDA under an Emergency Use Authorization (EUA).  This EUA will remain in effect (meaning this test can be used) for the duration of the COVID-19 declaration under Section 564(b)(1) of the Act, 21 U.S.C. section 360bbb-3(b)(1), unless the authorization is terminated or revoked sooner.  Performed at [facility], [place] [address] [place],  [place] [address]   SARS Coronavirus 2 by RT PCR (hospital order, performed in [facility] hospital lab) Nasopharyngeal Nasopharyngeal Swab     Status: None   Collection Time: 06/23/20 12:10 AM   Specimen: Nasopharyngeal Swab  Result Value Ref Range Status   SARS Coronavirus 2 NEGATIVE NEGATIVE Final    Comment: (NOTE) SARS-CoV-2 target nucleic acids are NOT DETECTED.  The SARS-CoV-2 RNA is generally detectable in upper and lower respiratory specimens during the acute phase of infection. The lowest concentration of SARS-CoV-2 viral copies this assay can detect is 250 copies / mL. A negative result does not preclude SARS-CoV-2 infection and should not be used as the sole basis for treatment or other patient management decisions.  A negative result may occur with improper specimen collection / handling, submission of specimen other than nasopharyngeal swab, presence of viral mutation(s) within the areas targeted by this assay, and inadequate number of viral copies (<250 copies / mL). A negative result must be combined with clinical observations, patient history, and epidemiological information.  Fact Sheet for Patients:    StrictlyIdeas.no  Fact Sheet for Healthcare Providers: BankingDealers.co.za  This test is not yet approved or  cleared by the [place] FDA and has been authorized for detection and/or diagnosis of SARS-CoV-2 by FDA under an Emergency Use Authorization (EUA).  This EUA will remain in effect (meaning this test can be used) for the duration of the COVID-19 declaration under Section 564(b)(1) of the Act, 21 U.S.C. section 360bbb-3(b)(1), unless the authorization is terminated or revoked sooner.  Performed at [facility], [place] [address], [place], [place] [address]     Labs: BNP (last 3 results) No results for input(s): BNP in the last 8760 hours. Basic Metabolic Panel: Recent Labs  Lab 06/19/20 0744 06/20/20 0516 06/20/20 1345 06/21/20 0512 06/22/20 0355 06/23/20 0341  NA 136 135  --  134* 135 136  K 3.7 3.4*  --  3.1* 3.3* 3.9  CL 97* 99  --  100 100 102  CO2 21* 25  --  26 25 26   GLUCOSE 122* 90  --  93 96 93  BUN 32* 39*  --  30* 22 17  CREATININE 2.92* 2.49*  --  1.78* 1.27* 1.16  CALCIUM 9.3 8.4*  --  8.1* 8.3* 8.7*  MG  --   --  2.6* 2.3  --  2.2  PHOS  --   --   --   --   --  3.5   Liver Function Tests: Recent Labs  Lab 06/19/20 0744 06/22/20 0355 06/23/20 0341  AST 41 24 28  ALT 28 22 23   ALKPHOS 85 64 66  BILITOT 1.3* 0.6 0.7  PROT 8.3* 6.2* 6.4*  ALBUMIN 4.2 3.1* 3.2*   No results for input(s): LIPASE, AMYLASE in the last 168 hours. No results for input(s): AMMONIA in the last 168 hours. CBC: Recent Labs  Lab 06/19/20 0744 06/20/20 0516 06/20/20 1345 06/21/20 0512 06/21/20 0816 06/22/20 0355 06/23/20 0341  WBC 6.7   < > 4.2 2.8* 2.6* 3.1* 3.5*  NEUTROABS 5.8  --   --  1.3* 1.2* 1.4* 1.3*  HGB 15.1   < > 12.0* 11.5* 11.9* 11.8* 12.0*  HCT 47.0   < > 36.2* 35.6* 36.1* 35.3* 36.7*  MCV 89.4   < > 87.7 89.4 88.9 87.2 87.6  PLT 296   < > 209 189 214 215 230   < > = values in  this interval not  displayed.   Cardiac Enzymes: Recent Labs  Lab 06/19/20 0811 06/20/20 0516 06/23/20 0341  CKTOTAL 539* 453* 221   BNP: Invalid input(s): POCBNP CBG: No results for input(s): GLUCAP in the last 168 hours. D-Dimer No results for input(s): DDIMER in the last 72 hours. Hgb A1c No results for input(s): HGBA1C in the last 72 hours. Lipid Profile No results for input(s): CHOL, HDL, LDLCALC, TRIG, CHOLHDL, LDLDIRECT in the last 72 hours. Thyroid function studies Recent Labs    06/20/20 1345  TSH 0.888   Anemia work up No results for input(s): VITAMINB12, FOLATE, FERRITIN, TIBC, IRON, RETICCTPCT in the last 72 hours. Urinalysis    Component Value Date/Time   COLORURINE AMBER (A) 06/19/2020 1327   APPEARANCEUR HAZY (A) 06/19/2020 1327   LABSPEC 1.024 06/19/2020 1327   PHURINE 5.0 06/19/2020 1327   GLUCOSEU NEGATIVE 06/19/2020 1327   HGBUR SMALL (A) 06/19/2020 1327   HGBUR trace-intact 09/15/2010 0902   BILIRUBINUR NEGATIVE 06/19/2020 1327   BILIRUBINUR Negative 12/06/2017 0916   KETONESUR 5 (A) 06/19/2020 1327   PROTEINUR 100 (A) 06/19/2020 1327   UROBILINOGEN 0.2 12/06/2017 0916   UROBILINOGEN 0.2 04/16/2012 1636   NITRITE NEGATIVE 06/19/2020 1327   LEUKOCYTESUR NEGATIVE 06/19/2020 1327   Sepsis Labs Invalid input(s): PROCALCITONIN,  WBC,  LACTICIDVEN Microbiology Recent Results (from the past 240 hour(s))  SARS Coronavirus 2 by RT PCR (hospital order, performed in [facility] hospital lab) Nasopharyngeal Nasopharyngeal Swab     Status: None   Collection Time: 06/19/20  1:27 PM   Specimen: Nasopharyngeal Swab  Result Value Ref Range Status   SARS Coronavirus 2 NEGATIVE NEGATIVE Final    Comment: (NOTE) SARS-CoV-2 target nucleic acids are NOT DETECTED.  The SARS-CoV-2 RNA is generally detectable in upper and lower respiratory specimens during the acute phase of infection. The lowest concentration of SARS-CoV-2 viral copies this assay can detect is  250 copies / mL. A negative result does not preclude SARS-CoV-2 infection and should not be used as the sole basis for treatment or other patient management decisions.  A negative result may occur with improper specimen collection / handling, submission of specimen other than nasopharyngeal swab, presence of viral mutation(s) within the areas targeted by this assay, and inadequate number of viral copies (<250 copies / mL). A negative result must be combined with clinical observations, patient history, and epidemiological information.  Fact Sheet for Patients:   StrictlyIdeas.no  Fact Sheet for Healthcare Providers: BankingDealers.co.za  This test is not yet approved or  cleared by the [place] FDA and has been authorized for detection and/or diagnosis of SARS-CoV-2 by FDA under an Emergency Use Authorization (EUA).  This EUA will remain in effect (meaning this test can be used) for the duration of the COVID-19 declaration under Section 564(b)(1) of the Act, 21 U.S.C. section 360bbb-3(b)(1), unless the authorization is terminated or revoked sooner.  Performed at [facility], [place] [address], [place],  [id number]   SARS Coronavirus 2 by RT PCR (hospital order, performed in [facility] hospital lab) Nasopharyngeal Nasopharyngeal Swab     Status: None   Collection Time: 06/23/20 12:10 AM   Specimen: Nasopharyngeal Swab  Result Value Ref Range Status   SARS Coronavirus 2 NEGATIVE NEGATIVE Final    Comment: (NOTE) SARS-CoV-2 target nucleic acids are NOT DETECTED.  The SARS-CoV-2 RNA is generally detectable in upper and lower respiratory specimens during the acute phase of infection. The lowest concentration of SARS-CoV-2 viral copies this assay can  detect is 250 copies / mL. A negative result does not preclude SARS-CoV-2 infection and should not be used as the sole basis for treatment or other patient  management decisions.  A negative result may occur with improper specimen collection / handling, submission of specimen other than nasopharyngeal swab, presence of viral mutation(s) within the areas targeted by this assay, and inadequate number of viral copies (<250 copies / mL). A negative result must be combined with clinical observations, patient history, and epidemiological information.  Fact Sheet for Patients:   StrictlyIdeas.no  Fact Sheet for Healthcare Providers: BankingDealers.co.za  This test is not yet approved or  cleared by the [place] FDA and has been authorized for detection and/or diagnosis of SARS-CoV-2 by FDA under an Emergency Use Authorization (EUA).  This EUA will remain in effect (meaning this test can be used) for the duration of the COVID-19 declaration under Section 564(b)(1) of the Act, 21 U.S.C. section 360bbb-3(b)(1), unless the authorization is terminated or revoked sooner.  Performed at [facility], [place] [address], [place], [place] [address]    Time coordinating discharge: 35 minutes  SIGNED:  Kerney Elbe, DO [facility] 06/23/2020, 11:01 AM Pager is on [place]  If 7PM-7AM, please contact night-coverage [email or web address]

## 2020-06-23 NOTE — Consult Note (Signed)
   [facility] CM Inpatient Consult   06/23/2020  Edward Stafford Jan 13, 1951 [id number]   Patient screened for high risk score for unplanned readmission. Chart reviewed to assess for potential [place] [facility] community service needs. Per review, disposition is for SNF. No [name] Care Management follow up needs at this time.  Netta Cedars, MSN, [facility] Liaison Nurse Mobile Phone [phone]  Toll free office [phone]

## 2020-06-23 NOTE — TOC Progression Note (Addendum)
Transition of Care [facility]) - Progression Note    Patient Details  Name: Edward Stafford MRN: [id number] Date of Birth: 1951/07/15  Transition of [facility]) CM/SW Contact  [name], [place] Phone Number: 06/23/2020, 12:30 PM  Clinical Narrative:     CSW spoke to patient's family they have chosen [facility]F for short term rehab.  CSW contacted [facility] and they can accept him tomorrow pending insurance authorization.  CSW contacted insurance company, and his approval is still pending reference number is [id number].  CSW to continue to follow patient's progress throughout discharge planning.  5:40pm  CSW received authorization for patient to go to [facility], Josem Kaufmann number is [id number], next review August 2nd.  CSW contacted [facility] and they can accept patient tomorrow morning.  CSW to facilitate discharge planning.   Expected Discharge Plan: Skilled Nursing Facility Barriers to Discharge: Insurance Authorization  Expected Discharge Plan and Services Expected Discharge Plan: [place] In-house Referral: Clinical Social Work   Post Acute Care Choice: [place] Living arrangements for the past 2 months: Apartment Expected Discharge Date: 06/23/20                 DME Agency: NA       HH Arranged: NA           Social Determinants of Health (SDOH) Interventions    Readmission Risk Interventions No flowsheet data found.

## 2020-06-23 NOTE — Care Management Important Message (Signed)
Important Message  Patient Details IM Letter given to the Patient Name: Edward Stafford MRN: [id number] Date of Birth: [birth year]   Medicare Important Message Given:  Yes     Kerin Salen 06/23/2020, 10:12 AM

## 2020-06-24 DIAGNOSIS — R296 Repeated falls: Secondary | ICD-10-CM | POA: Diagnosis not present

## 2020-06-24 DIAGNOSIS — D709 Neutropenia, unspecified: Secondary | ICD-10-CM | POA: Diagnosis not present

## 2020-06-24 DIAGNOSIS — G63 Polyneuropathy in diseases classified elsewhere: Secondary | ICD-10-CM | POA: Diagnosis not present

## 2020-06-24 DIAGNOSIS — E782 Mixed hyperlipidemia: Secondary | ICD-10-CM | POA: Diagnosis not present

## 2020-06-24 DIAGNOSIS — R279 Unspecified lack of coordination: Secondary | ICD-10-CM | POA: Diagnosis not present

## 2020-06-24 DIAGNOSIS — H409 Unspecified glaucoma: Secondary | ICD-10-CM | POA: Diagnosis not present

## 2020-06-24 DIAGNOSIS — I499 Cardiac arrhythmia, unspecified: Secondary | ICD-10-CM | POA: Diagnosis not present

## 2020-06-24 DIAGNOSIS — R2681 Unsteadiness on feet: Secondary | ICD-10-CM | POA: Diagnosis not present

## 2020-06-24 DIAGNOSIS — M6281 Muscle weakness (generalized): Secondary | ICD-10-CM | POA: Diagnosis not present

## 2020-06-24 DIAGNOSIS — Z03818 Encounter for observation for suspected exposure to other biological agents ruled out: Secondary | ICD-10-CM | POA: Diagnosis not present

## 2020-06-24 DIAGNOSIS — M6282 Rhabdomyolysis: Secondary | ICD-10-CM | POA: Diagnosis not present

## 2020-06-24 DIAGNOSIS — E538 Deficiency of other specified B group vitamins: Secondary | ICD-10-CM | POA: Diagnosis not present

## 2020-06-24 DIAGNOSIS — G3184 Mild cognitive impairment, so stated: Secondary | ICD-10-CM | POA: Diagnosis not present

## 2020-06-24 DIAGNOSIS — Z7401 Bed confinement status: Secondary | ICD-10-CM | POA: Diagnosis not present

## 2020-06-24 DIAGNOSIS — E440 Moderate protein-calorie malnutrition: Secondary | ICD-10-CM | POA: Diagnosis not present

## 2020-06-24 DIAGNOSIS — W19XXXD Unspecified fall, subsequent encounter: Secondary | ICD-10-CM | POA: Diagnosis not present

## 2020-06-24 DIAGNOSIS — R2689 Other abnormalities of gait and mobility: Secondary | ICD-10-CM | POA: Diagnosis not present

## 2020-06-24 DIAGNOSIS — D519 Vitamin B12 deficiency anemia, unspecified: Secondary | ICD-10-CM | POA: Diagnosis not present

## 2020-06-24 DIAGNOSIS — G629 Polyneuropathy, unspecified: Secondary | ICD-10-CM | POA: Diagnosis not present

## 2020-06-24 DIAGNOSIS — Z20822 Contact with and (suspected) exposure to covid-19: Secondary | ICD-10-CM | POA: Diagnosis not present

## 2020-06-24 DIAGNOSIS — Z743 Need for continuous supervision: Secondary | ICD-10-CM | POA: Diagnosis not present

## 2020-06-24 DIAGNOSIS — I10 Essential (primary) hypertension: Secondary | ICD-10-CM | POA: Diagnosis not present

## 2020-06-24 DIAGNOSIS — M2550 Pain in unspecified joint: Secondary | ICD-10-CM | POA: Diagnosis not present

## 2020-06-24 DIAGNOSIS — N179 Acute kidney failure, unspecified: Secondary | ICD-10-CM | POA: Diagnosis not present

## 2020-06-24 DIAGNOSIS — N183 Chronic kidney disease, stage 3 unspecified: Secondary | ICD-10-CM | POA: Diagnosis not present

## 2020-06-24 NOTE — Progress Notes (Signed)
Sunny Schlein from [facility] called and accepted report for the pt discharging to the facility

## 2020-06-24 NOTE — Discharge Summary (Signed)
Physician Discharge Summary  Edward Stafford ASN:[id number] DOB: 1951-07-29 DOA: 06/19/2020  PCP: Colon Branch, MD  Admit date: 06/19/2020 Discharge date: 06/24/2020  Admitted From: Home Disposition: SNF  Recommendations for Outpatient Follow-up:  1. Follow up with PCP in 1-2 weeks 2. Follow up with Neurology in the outpatient setting; Ambulatory Referral has been made 3. Please obtain CMP/CBC, Mag, Phos in one week 4. Please follow up on the following pending results:  Home Health: No Equipment/Devices: None    Discharge Condition: Stable  CODE STATUS: FULL CODE Diet recommendation: Heart Healthy Diet   Brief/Interim Summary: HPI per [name] on 06/19/20 This is a 69 year old malewho is a poor historianwith past medical history of chronic back pain, drug abuse,neuropathy,prostate cancer with radioactive seed implantation,recurrent falls,hypertension, hyperlipidemia, CAD s/p stent, former tobacco use,alcohol use reports last drink 1 week ago who presented to [facility] ED on 7/24with recurrent falls over the past week and difficulty walking.  Patient reports that he had a fall 1 week ago and that he was on the floor for several hours. Since that time he states that he fell 2 or 3 more times while at home. Whenever asked about symptoms prior to his fall or what led up to the fall or where he was he simply stated "I fell" but does not provide any further detail. He currently denies any nausea, vomiting, diarrhea, chest pain, shortness of breath, palpitations, pain anywhere, loss of consciousness or any other issues. He was seen by his PCP, [name], 6/25, who recommended PT and podiatry but the patient declined.  I called the patient's nephew, [name] a radiologist in [place], who provided more information. States that he is concerned his uncle has been suffering from TIAs and believes his uncle to have been on the floor for 4 hours last week. States he has been having  shuffling gait and has a history of chronic urinary incontinence after his prostate procedure. Has noticed his uncle's general health declining over the past several months. States the patient lives with a roommate and takes care of himself and is unsure if he has been compliant with his medications. States that he does not feel his uncle can take care of himself anymore. Also confused nightly.  ED Course:Vitals unremarkable. CT head with severe chronic small vessel ischemic disease but no acute abnormality. Labs most notable for Cr 2.92 (previously 1.95 in June) and CK 539.  **Interim History Patient continues to be weak but his mental status is improving and his laboratory data is much improved.  Plan is to be discharged to skilled nursing facility and will need outpatient neurology evaluation. Repeat Covid testing is negative and patient has been vaccinated. He is medically stable to D/C to SNF now.   ADDENDUM 06/24/20: The patient remains medically stable to D/C to SNF and could not go to the skilled nursing facility last night given the insurance authorization was received later on in the evening.  Patient can be accepted to the SNF today and he remains medically stable and had no acute events overnight.  He will need continued rehab for strengthening.  Discharge Diagnoses:  Principal Problem:   Fall at home, initial encounter Active Problems:   HYPERLIPIDEMIA, MIXED   HTN (hypertension)   SINUS BRADYCARDIA   CKD (chronic kidney disease), stage III   Acute kidney injury superimposed on CKD (HCC)   Hypotension   B12 deficiency   Tachycardia   Rhabdomyolysis   Hypokalemia   Peripheral neuropathy  Shuffling gait   BPH (benign prostatic hyperplasia)   High anion gap metabolic acidosis   Recurrent falls   Malnutrition of moderate degree   Neutropenia (HCC)  Recurrent falls, suspect symptomatic B12 deficiency affecting gait in setting of possibly undiagnosed dementia given  atrophy on MRI scan (questionable vascular dementia or Parkinson's with shuffling gait),. -Likely additionally worsened by diminished oral intake causing relative hypotension while patient was taking home BP meds in setting of likely cognitive impairment.  -Orthostatic vitals negative but patient having a hard time standing due to his weakness, TSH within normal limits, -Will discontinue HCTZ -IM B12 daily x7 days (Day 4 today)  -PT recommends SNF and anticipating D/Cing in the AM when Insurance Authorization has been obtained  -Will need formal Neurology evaluation in the outpatient setting and Ambulatory referral has been made  Neutropenia, new.  -ANC now 1300.No fevers, no localizing symptoms. WBC is 3.5 -repeat CBC with diff within 1 week  -if spikes fever start appropriate empiric antibiotics and blood/urine culturesbut his WBC is improving slowly and will be closely monitored  Symptomatic B12 deficiency -Patient with shuffling gait, peripheral neuropathy, cognitive impairment and recurrent falls. B12 191.  -C/w Daily IM B12 x7 days -Follow up with Neurology in the outpatient setting   Cognitive impairment, high concern for dementia, possibly vascular given MRI imaging, Parkinsons also consideration with shuffling gait. -Nephew reportssignificant decline over the past several months prior to admission. -Will need outpatient neurological work-up and Ambulatory Referral has been made  -C/w Delirium precautions, high risk for sundowning, C/w Quetiapine 25 mg po qHS -PT recommends SNF, social worker consulted for assistance with placement and patient has a bed offer at [facility] and awaiting Insurance Authorization  Relative hypotension and tachycardia, resolved Suspect related to potential dehydrationgiven improvement with IV fluids and holding home blood pressure medicines.  -TSH within normal limits, orthostatic vitals within normal limits.  -T-max of 100 on  admission, does have new leukopenia but no localizing signs or symptoms of infection. -Will D/C HCTZ at discharge -Obtain Blood cultures if becomes febrile  AKI on CKD stage IIIa, improving baseline creatinine of 1.4-1.5, 2.92 on admission, continues to downtrend.  -Suspect prerenal etiology related to diminished oral intake/hypotension and UA with hyaline casts,though does note some proteinuria on UA and elevated CK,need to rule out post renal/intrarenal. Mild parenchymal atrophy on renal ultrasound, and some degree of bladder distention -Hold home losartan for now but likely can be resumed at discharge; Will stop HCTZ again  -Close monitor ins and outs -Patient is BUN/creatinine went from 39/2.49 is now  17/1.16 and improved -Bladder scan, as needed in and out catheterization if greater than 250 cc on PVR -Avoid nephrotoxic medications, contrast dyes, hypotension and renally dose medications  -Repeat CMP in a.m.  Rhabdomyolysis, improving. -In setting of fall. Has no muscle pain. CK already downtrending  and resolved (453 and improved to 221) after IVF and will repeat in a.m.  Hypokalemia -Persists no current GI losses. -Was on a diuretic (HCTZ) but we will stop this and K+ subsequently improved and is now 3.9 -Replete orally with p.o. potassium chloride 40 mEq twice daily x2 doses as an outpatient  -Mag level today is 2.2 -continue monitor and replete as necessary Repeat CMP in a.m.  Normocytic anemia, new. -Previous baseline hemoglobin 15 -Today his hemoglobin/hematocrit is now 12.0/36.7 -Likely dilution related to IV fluids. -Check anemia panel in the outpatient setting .  -Continue to monitor for signs or symptoms of bleeding; currently  no overt bleeding noted -Repeat CBC in a.m.  BPH,with history of prostate cancer.  -Bladder scan the day before yesterday showed retention of 350 cc. -Monitor intake and output -Bladder scan, as needed in and out  catheterization if greater than 250 cc on PVR -Continue home Tamsulosin at D/C  History of hypertension. -Now becoming more hypertensive with SBP's in the 150s. -Resumed home HCTZ yesterday but will discontinue again given his risk of dehydration -Holding home losartan in setting of AKI likely can be resumed -He with as needed hydralazine for blood pressure -Blood pressure is acceptable with a BP of 140/83 on last check  Anion gap metabolic acidosis secondary to lactic acidosis, resolved.  -Likely mild lactic acidosis related to relative hypotension in the setting of diminished oral intake -IV fluid hydration has been given -Patient's CO2 is 26, anion gap is 8, chloride level is 102 now  Hyperlipidemia, stable -Continue Pravastatin 20 mg p.o. nightly  History of Bradycardia.  -Telemetry was reviewed and has remained in normal sinus rhythm and normal rate -Continue monitor on telemetry while hospitalized   Alcohol use.  -States last drink 1 week ago. No current signs or symptoms of alcohol withdrawal. -Monitor CIWA protocol  Obesity -Estimated body mass index is 30.72 kg/m as calculated from the following:   Height as of this encounter: 6' (1.829 m).   Weight as of this encounter: 102.7 kg. -Weight Loss and Dietary Counseling given   Discharge Instructions  Discharge Instructions    Ambulatory referral to Neurology   Complete by: As directed    An appointment is requested in approximately: 1-2 weeks for Cognitive Impairment with Concern for Dementia   Call MD for:  difficulty breathing, headache or visual disturbances   Complete by: As directed    Call MD for:  extreme fatigue   Complete by: As directed    Call MD for:  hives   Complete by: As directed    Call MD for:  persistant dizziness or light-headedness   Complete by: As directed    Call MD for:  persistant nausea and vomiting   Complete by: As directed    Call MD for:  redness, tenderness, or signs of  infection (pain, swelling, redness, odor or green/yellow discharge around incision site)   Complete by: As directed    Call MD for:  severe uncontrolled pain   Complete by: As directed    Call MD for:  temperature >100.4   Complete by: As directed    Diet - low sodium heart healthy   Complete by: As directed    Discharge instructions   Complete by: As directed    You were cared for by a hospitalist during your hospital stay. If you have any questions about your discharge medications or the care you received while you were in the hospital after you are discharged, you can call the unit and ask to speak with the hospitalist on call if the hospitalist that took care of you is not available. Once you are discharged, your primary care physician will handle any further medical issues. Please note that NO REFILLS for any discharge medications will be authorized once you are discharged, as it is imperative that you return to your primary care physician (or establish a relationship with a primary care physician if you do not have one) for your aftercare needs so that they can reassess your need for medications and monitor your lab values.  Follow up with PCP and Neurology in the  outpatient setting. Take all medications as prescribed. If symptoms change or worsen please return to the ED for evaluation   Increase activity slowly   Complete by: As directed      Allergies as of 06/24/2020   No Known Allergies     Medication List    STOP taking these medications   hydrochlorothiazide 25 MG tablet Commonly known as: HYDRODIURIL     TAKE these medications   aspirin EC 81 MG tablet Take 81 mg by mouth daily.   cyanocobalamin 1000 MCG/ML injection Commonly known as: (VITAMIN B-12) Inject 1 mL (1,000 mcg total) into the muscle daily for 4 days.   feeding supplement (ENSURE ENLIVE) Liqd Take 237 mLs by mouth 3 (three) times daily between meals.   losartan 25 MG tablet Commonly known as:  COZAAR Take 1 tablet (25 mg total) by mouth every evening.   multivitamin with minerals Tabs tablet Take 1 tablet by mouth daily.   pravastatin 20 MG tablet Commonly known as: PRAVACHOL Take 1 tablet (20 mg total) by mouth daily.   QUEtiapine 25 MG tablet Commonly known as: SEROQUEL Take 1 tablet (25 mg total) by mouth at bedtime.   tamsulosin 0.4 MG Caps capsule Commonly known as: FLOMAX Take 0.4 mg by mouth daily.       Contact information for follow-up providers    Colon Branch, MD. Call.   Specialty: Internal Medicine Why: Follow up within 1-2 weeks Contact information: [address] [place] [address] [phone]            Contact information for after-discharge care    Destination    [facility]erred SNF .   Service: Skilled Nursing Contact information: [place] [phone]                 No Known Allergies  Consultations:  None  Procedures/Studies: CT Head Wo Contrast  Result Date: 06/19/2020 CLINICAL DATA:  Weakness status post fall. EXAM: CT HEAD WITHOUT CONTRAST TECHNIQUE: Contiguous axial images were obtained from the base of the skull through the vertex without intravenous contrast. COMPARISON:  04/17/2012 FINDINGS: Brain: There is no evidence of acute infarct, intracranial hemorrhage, mass, midline shift, or extra-axial fluid collection. Confluent hypodensities in the cerebral white matter bilaterally are nonspecific but compatible with severe chronic small vessel ischemic disease, stable to slightly progressed from the prior CT. There is a chronic lacunar infarct in the right thalamus. Vascular: Calcified atherosclerosis at the skull base. No hyperdense vessel. Skull: No fracture or suspicious osseous lesion. Sinuses/Orbits: Visualized paranasal sinuses and mastoid air cells are clear. Unremarkable orbits. Other: None. IMPRESSION: 1. No  evidence of acute intracranial abnormality. 2. Severe chronic small vessel ischemic disease. Electronically Signed   By: Logan Bores M.D.   On: 06/19/2020 08:40   MR BRAIN WO CONTRAST  Result Date: 06/19/2020 CLINICAL DATA:  Neuro deficit.  Stroke suspected. EXAM: MRI HEAD WITHOUT CONTRAST TECHNIQUE: Multiplanar, multiecho pulse sequences of the brain and surrounding structures were obtained without intravenous contrast. COMPARISON:  CT head without contrast 06/19/2020. FINDINGS: Brain: Confluent periventricular and subcortical T2 hyperintensities are present bilaterally. No acute infarct, hemorrhage, or mass lesion is present. Remote lacunar infarcts are present within the thalami bilaterally. White matter changes extend into the brainstem. A remote lacunar infarct is present in the left cerebellum. Vascular: Flow is present in the major intracranial arteries. Skull and upper cervical spine: Degenerative changes are present at  C1-2. Craniocervical junction is within normal limits. Midline structures are unremarkable. Sinuses/Orbits: The paranasal sinuses and mastoid air cells are clear. The globes and orbits are within normal limits. IMPRESSION: 1. No acute intracranial abnormality. 2. Confluent periventricular and subcortical T2 hyperintensities bilaterally are advanced for age. This likely reflects the sequela of chronic microvascular ischemia. 3. Remote lacunar infarcts of the thalami bilaterally and left cerebellum. Electronically Signed   By: San Morelle M.D.   On: 06/19/2020 15:37   US RENAL  Result Date: 06/20/2020 CLINICAL DATA:  69 year old male with acute on chronic renal insufficiency. EXAM: RENAL / URINARY TRACT ULTRASOUND COMPLETE COMPARISON:  None. FINDINGS: Right Kidney: Renal measurements: 12.1 x 4.6 x 5.2 cm = volume: 151 mL. Mild parenchyma atrophy. Normal echogenicity. No hydronephrosis or shadowing stone. Left Kidney: Renal measurements: 13.4 x 5.7 x 5.0 cm = volume: 199 mL.  Normal echogenicity. Mild parenchyma atrophy. No hydronephrosis or shadowing stone. Bladder: Appears normal for degree of bladder distention. Other: None. IMPRESSION: Mild parenchyma atrophy, otherwise unremarkable renal ultrasound. Electronically Signed   By: Anner Crete M.D.   On: 06/20/2020 17:28    Subjective: Examined at bedside he had no complaints.  Still felt weak.  No nausea or vomiting.  No other concerns or complaints at this time.  He is currently medically stable to be discharged at this time will need to follow-up with PCP and neurology in outpatient setting and he understands agrees with the plan of care.  Discharge Exam: Vitals:   06/23/20 1952 06/24/20 0521  BP: (!) 154/131 (!) 148/89  Pulse: 83 54  Resp: 18 22  Temp: 98.5 F (36.9 C) 98.5 F (36.9 C)  SpO2: 99% 97%   Vitals:   06/23/20 0652 06/23/20 1501 06/23/20 1952 06/24/20 0521  BP: (!) 140/83 (!) 116/91 (!) 154/131 (!) 148/89  Pulse: 56 91 83 54  Resp: 16 20 18 22   Temp:  (!) 97.5 F (36.4 C) 98.5 F (36.9 C) 98.5 F (36.9 C)  TempSrc:  Oral Oral Oral  SpO2:  99% 99% 97%  Weight:      Height:       General: Pt is alert, awake, not in acute distress Cardiovascular: RRR, S1/S2 +, no rubs, no gallops Respiratory: Diminished bilaterally, no wheezing, no rhonchi Abdominal: Soft, NT, Distended 2/2 to body habitus, bowel sounds + Extremities: no edema, no cyanosis  The results of significant diagnostics from this hospitalization (including imaging, microbiology, ancillary and laboratory) are listed below for reference.    Microbiology: Recent Results (from the past 240 hour(s))  SARS Coronavirus 2 by RT PCR (hospital order, performed in [facility] hospital lab) Nasopharyngeal Nasopharyngeal Swab     Status: None   Collection Time: 06/19/20  1:27 PM   Specimen: Nasopharyngeal Swab  Result Value Ref Range Status   SARS Coronavirus 2 NEGATIVE NEGATIVE Final    Comment: (NOTE) SARS-CoV-2 target nucleic  acids are NOT DETECTED.  The SARS-CoV-2 RNA is generally detectable in upper and lower respiratory specimens during the acute phase of infection. The lowest concentration of SARS-CoV-2 viral copies this assay can detect is 250 copies / mL. A negative result does not preclude SARS-CoV-2 infection and should not be used as the sole basis for treatment or other patient management decisions.  A negative result may occur with improper specimen collection / handling, submission of specimen other than nasopharyngeal swab, presence of viral mutation(s) within the areas targeted by this assay, and inadequate number of viral copies (<250 copies /  mL). A negative result must be combined with clinical observations, patient history, and epidemiological information.  Fact Sheet for Patients:   StrictlyIdeas.no  Fact Sheet for Healthcare Providers: BankingDealers.co.za  This test is not yet approved or  cleared by the [place] FDA and has been authorized for detection and/or diagnosis of SARS-CoV-2 by FDA under an Emergency Use Authorization (EUA).  This EUA will remain in effect (meaning this test can be used) for the duration of the COVID-19 declaration under Section 564(b)(1) of the Act, 21 U.S.C. section 360bbb-3(b)(1), unless the authorization is terminated or revoked sooner.  Performed at [facility], [place] [address], [place], [place] [address]   SARS Coronavirus 2 by RT PCR (hospital order, performed in [facility] hospital lab) Nasopharyngeal Nasopharyngeal Swab     Status: None   Collection Time: 06/23/20 12:10 AM   Specimen: Nasopharyngeal Swab  Result Value Ref Range Status   SARS Coronavirus 2 NEGATIVE NEGATIVE Final    Comment: (NOTE) SARS-CoV-2 target nucleic acids are NOT DETECTED.  The SARS-CoV-2 RNA is generally detectable in upper and lower respiratory specimens during the acute phase of infection. The  lowest concentration of SARS-CoV-2 viral copies this assay can detect is 250 copies / mL. A negative result does not preclude SARS-CoV-2 infection and should not be used as the sole basis for treatment or other patient management decisions.  A negative result may occur with improper specimen collection / handling, submission of specimen other than nasopharyngeal swab, presence of viral mutation(s) within the areas targeted by this assay, and inadequate number of viral copies (<250 copies / mL). A negative result must be combined with clinical observations, patient history, and epidemiological information.  Fact Sheet for Patients:   StrictlyIdeas.no  Fact Sheet for Healthcare Providers: BankingDealers.co.za  This test is not yet approved or  cleared by the [place] FDA and has been authorized for detection and/or diagnosis of SARS-CoV-2 by FDA under an Emergency Use Authorization (EUA).  This EUA will remain in effect (meaning this test can be used) for the duration of the COVID-19 declaration under Section 564(b)(1) of the Act, 21 U.S.C. section 360bbb-3(b)(1), unless the authorization is terminated or revoked sooner.  Performed at [facility], [place] [address], [place], [place] [address]     Labs: BNP (last 3 results) No results for input(s): BNP in the last 8760 hours. Basic Metabolic Panel: Recent Labs  Lab 06/19/20 0744 06/20/20 0516 06/20/20 1345 06/21/20 0512 06/22/20 0355 06/23/20 0341  NA 136 135  --  134* 135 136  K 3.7 3.4*  --  3.1* 3.3* 3.9  CL 97* 99  --  100 100 102  CO2 21* 25  --  26 25 26   GLUCOSE 122* 90  --  93 96 93  BUN 32* 39*  --  30* 22 17  CREATININE 2.92* 2.49*  --  1.78* 1.27* 1.16  CALCIUM 9.3 8.4*  --  8.1* 8.3* 8.7*  MG  --   --  2.6* 2.3  --  2.2  PHOS  --   --   --   --   --  3.5   Liver Function Tests: Recent Labs  Lab 06/19/20 0744 06/22/20 0355  06/23/20 0341  AST 41 24 28  ALT 28 22 23   ALKPHOS 85 64 66  BILITOT 1.3* 0.6 0.7  PROT 8.3* 6.2* 6.4*  ALBUMIN 4.2 3.1* 3.2*   No results for input(s): LIPASE, AMYLASE in the last 168  hours. No results for input(s): AMMONIA in the last 168 hours. CBC: Recent Labs  Lab 06/19/20 0744 06/20/20 0516 06/20/20 1345 06/21/20 0512 06/21/20 0816 06/22/20 0355 06/23/20 0341  WBC 6.7   < > 4.2 2.8* 2.6* 3.1* 3.5*  NEUTROABS 5.8  --   --  1.3* 1.2* 1.4* 1.3*  HGB 15.1   < > 12.0* 11.5* 11.9* 11.8* 12.0*  HCT 47.0   < > 36.2* 35.6* 36.1* 35.3* 36.7*  MCV 89.4   < > 87.7 89.4 88.9 87.2 87.6  PLT 296   < > 209 189 214 215 230   < > = values in this interval not displayed.   Cardiac Enzymes: Recent Labs  Lab 06/19/20 0811 06/20/20 0516 06/23/20 0341  CKTOTAL 539* 453* 221   BNP: Invalid input(s): POCBNP CBG: No results for input(s): GLUCAP in the last 168 hours. D-Dimer No results for input(s): DDIMER in the last 72 hours. Hgb A1c No results for input(s): HGBA1C in the last 72 hours. Lipid Profile No results for input(s): CHOL, HDL, LDLCALC, TRIG, CHOLHDL, LDLDIRECT in the last 72 hours. Thyroid function studies No results for input(s): TSH, T4TOTAL, T3FREE, THYROIDAB in the last 72 hours.  Invalid input(s): FREET3 Anemia work up No results for input(s): VITAMINB12, FOLATE, FERRITIN, TIBC, IRON, RETICCTPCT in the last 72 hours. Urinalysis    Component Value Date/Time   COLORURINE AMBER (A) 06/19/2020 1327   APPEARANCEUR HAZY (A) 06/19/2020 1327   LABSPEC 1.024 06/19/2020 1327   PHURINE 5.0 06/19/2020 1327   GLUCOSEU NEGATIVE 06/19/2020 1327   HGBUR SMALL (A) 06/19/2020 1327   HGBUR trace-intact 09/15/2010 0902   BILIRUBINUR NEGATIVE 06/19/2020 1327   BILIRUBINUR Negative 12/06/2017 0916   KETONESUR 5 (A) 06/19/2020 1327   PROTEINUR 100 (A) 06/19/2020 1327   UROBILINOGEN 0.2 12/06/2017 0916   UROBILINOGEN 0.2 04/16/2012 1636   NITRITE NEGATIVE 06/19/2020 1327    LEUKOCYTESUR NEGATIVE 06/19/2020 1327   Sepsis Labs Invalid input(s): PROCALCITONIN,  WBC,  LACTICIDVEN Microbiology Recent Results (from the past 240 hour(s))  SARS Coronavirus 2 by RT PCR (hospital order, performed in [facility] hospital lab) Nasopharyngeal Nasopharyngeal Swab     Status: None   Collection Time: 06/19/20  1:27 PM   Specimen: Nasopharyngeal Swab  Result Value Ref Range Status   SARS Coronavirus 2 NEGATIVE NEGATIVE Final    Comment: (NOTE) SARS-CoV-2 target nucleic acids are NOT DETECTED.  The SARS-CoV-2 RNA is generally detectable in upper and lower respiratory specimens during the acute phase of infection. The lowest concentration of SARS-CoV-2 viral copies this assay can detect is 250 copies / mL. A negative result does not preclude SARS-CoV-2 infection and should not be used as the sole basis for treatment or other patient management decisions.  A negative result may occur with improper specimen collection / handling, submission of specimen other than nasopharyngeal swab, presence of viral mutation(s) within the areas targeted by this assay, and inadequate number of viral copies (<250 copies / mL). A negative result must be combined with clinical observations, patient history, and epidemiological information.  Fact Sheet for Patients:   StrictlyIdeas.no  Fact Sheet for Healthcare Providers: BankingDealers.co.za  This test is not yet approved or  cleared by the [place] FDA and has been authorized for detection and/or diagnosis of SARS-CoV-2 by FDA under an Emergency Use Authorization (EUA).  This EUA will remain in effect (meaning this test can be used) for the duration of the COVID-19 declaration under Section 564(b)(1) of the Act,  21 U.S.C. section 360bbb-3(b)(1), unless the authorization is terminated or revoked sooner.  Performed at [facility], [place] [address], [place],  [place] [address]   SARS Coronavirus 2 by RT PCR (hospital order, performed in [facility] hospital lab) Nasopharyngeal Nasopharyngeal Swab     Status: None   Collection Time: 06/23/20 12:10 AM   Specimen: Nasopharyngeal Swab  Result Value Ref Range Status   SARS Coronavirus 2 NEGATIVE NEGATIVE Final    Comment: (NOTE) SARS-CoV-2 target nucleic acids are NOT DETECTED.  The SARS-CoV-2 RNA is generally detectable in upper and lower respiratory specimens during the acute phase of infection. The lowest concentration of SARS-CoV-2 viral copies this assay can detect is 250 copies / mL. A negative result does not preclude SARS-CoV-2 infection and should not be used as the sole basis for treatment or other patient management decisions.  A negative result may occur with improper specimen collection / handling, submission of specimen other than nasopharyngeal swab, presence of viral mutation(s) within the areas targeted by this assay, and inadequate number of viral copies (<250 copies / mL). A negative result must be combined with clinical observations, patient history, and epidemiological information.  Fact Sheet for Patients:   StrictlyIdeas.no  Fact Sheet for Healthcare Providers: BankingDealers.co.za  This test is not yet approved or  cleared by the [place] FDA and has been authorized for detection and/or diagnosis of SARS-CoV-2 by FDA under an Emergency Use Authorization (EUA).  This EUA will remain in effect (meaning this test can be used) for the duration of the COVID-19 declaration under Section 564(b)(1) of the Act, 21 U.S.C. section 360bbb-3(b)(1), unless the authorization is terminated or revoked sooner.  Performed at [facility], [place] [address], [place], [place] [address]    Time coordinating discharge: 35 minutes  SIGNED:  Kerney Elbe, DO [facility] 06/24/2020, 9:49 AM Pager is on  [place]  If 7PM-7AM, please contact night-coverage [email or web address]

## 2020-06-24 NOTE — TOC Transition Note (Signed)
Transition of Care [facility]) - CM/SW Discharge Note   Patient Details  Name: GROVER ROBINSON MRN: [id number] Date of Birth: 1951-10-30  [facility]) CM/SW Contact:  [name], LCSW Phone Number: 06/24/2020, 10:38 AM   Clinical Narrative:     CSW spoke to [facility] and they can accept patient today at 2pm.  CSW contacted patient's family members to let them know.  Patient to be d/c'ed today to [facility] room 3235.  Patient and family agreeable to plans will transport via ems RN to call report [phone].      Final next level of care: Skilled Nursing Facility Barriers to Discharge: Barriers Resolved   Patient Goals and CMS Choice Patient states their goals for this hospitalization and ongoing recovery are:: To go to SNF for short term rehab, then return back home. CMS Medicare.gov Compare Post Acute Care list provided to:: Patient Represenative (must comment) Choice offered to / list presented to : [facility] POA / Guardian [name], patient's family member.)  Discharge Placement PASRR number recieved: 06/21/20            Patient chooses bed at: [facility] Patient to be transferred to facility by: [place] Name of family member notified: Roselyn and [name] Patient and family notified of of transfer: 06/24/20  Discharge Plan and Services In-house Referral: Clinical Social Work   Post Acute Care Choice: [place]            DME Agency: NA       HH Arranged: NA          Social Determinants of Health (SDOH) Interventions     Readmission Risk Interventions No flowsheet data found.

## 2020-06-24 NOTE — Progress Notes (Signed)
Pt is to be discharged today to [facility] SN[facility]. RN has called twice to give report to the facility. Call back number given to the facility no return phone call at this time.

## 2020-06-27 DIAGNOSIS — Z03818 Encounter for observation for suspected exposure to other biological agents ruled out: Secondary | ICD-10-CM | POA: Diagnosis not present

## 2020-06-28 DIAGNOSIS — G63 Polyneuropathy in diseases classified elsewhere: Secondary | ICD-10-CM | POA: Diagnosis not present

## 2020-06-28 DIAGNOSIS — E782 Mixed hyperlipidemia: Secondary | ICD-10-CM | POA: Diagnosis not present

## 2020-06-28 DIAGNOSIS — I10 Essential (primary) hypertension: Secondary | ICD-10-CM | POA: Diagnosis not present

## 2020-06-29 DIAGNOSIS — M6282 Rhabdomyolysis: Secondary | ICD-10-CM | POA: Diagnosis not present

## 2020-06-29 DIAGNOSIS — N179 Acute kidney failure, unspecified: Secondary | ICD-10-CM | POA: Diagnosis not present

## 2020-06-29 DIAGNOSIS — E538 Deficiency of other specified B group vitamins: Secondary | ICD-10-CM | POA: Diagnosis not present

## 2020-06-29 DIAGNOSIS — R296 Repeated falls: Secondary | ICD-10-CM | POA: Diagnosis not present

## 2020-07-13 DIAGNOSIS — G8929 Other chronic pain: Secondary | ICD-10-CM | POA: Diagnosis not present

## 2020-07-13 DIAGNOSIS — N1831 Chronic kidney disease, stage 3a: Secondary | ICD-10-CM | POA: Diagnosis not present

## 2020-07-13 DIAGNOSIS — E782 Mixed hyperlipidemia: Secondary | ICD-10-CM | POA: Diagnosis not present

## 2020-07-13 DIAGNOSIS — D709 Neutropenia, unspecified: Secondary | ICD-10-CM | POA: Diagnosis not present

## 2020-07-13 DIAGNOSIS — G3184 Mild cognitive impairment, so stated: Secondary | ICD-10-CM | POA: Diagnosis not present

## 2020-07-13 DIAGNOSIS — Z87891 Personal history of nicotine dependence: Secondary | ICD-10-CM | POA: Diagnosis not present

## 2020-07-13 DIAGNOSIS — E440 Moderate protein-calorie malnutrition: Secondary | ICD-10-CM | POA: Diagnosis not present

## 2020-07-13 DIAGNOSIS — G629 Polyneuropathy, unspecified: Secondary | ICD-10-CM | POA: Diagnosis not present

## 2020-07-13 DIAGNOSIS — M549 Dorsalgia, unspecified: Secondary | ICD-10-CM | POA: Diagnosis not present

## 2020-07-13 DIAGNOSIS — I129 Hypertensive chronic kidney disease with stage 1 through stage 4 chronic kidney disease, or unspecified chronic kidney disease: Secondary | ICD-10-CM | POA: Diagnosis not present

## 2020-07-13 DIAGNOSIS — Z9181 History of falling: Secondary | ICD-10-CM | POA: Diagnosis not present

## 2020-07-13 DIAGNOSIS — H409 Unspecified glaucoma: Secondary | ICD-10-CM | POA: Diagnosis not present

## 2020-07-13 DIAGNOSIS — R339 Retention of urine, unspecified: Secondary | ICD-10-CM | POA: Diagnosis not present

## 2020-07-13 DIAGNOSIS — D519 Vitamin B12 deficiency anemia, unspecified: Secondary | ICD-10-CM | POA: Diagnosis not present

## 2020-07-13 DIAGNOSIS — I2510 Atherosclerotic heart disease of native coronary artery without angina pectoris: Secondary | ICD-10-CM | POA: Diagnosis not present

## 2020-07-13 DIAGNOSIS — Z955 Presence of coronary angioplasty implant and graft: Secondary | ICD-10-CM | POA: Diagnosis not present

## 2020-07-18 ENCOUNTER — Telehealth: Payer: Self-pay

## 2020-07-18 ENCOUNTER — Telehealth: Payer: Self-pay | Admitting: Internal Medicine

## 2020-07-18 DIAGNOSIS — D519 Vitamin B12 deficiency anemia, unspecified: Secondary | ICD-10-CM | POA: Diagnosis not present

## 2020-07-18 DIAGNOSIS — E782 Mixed hyperlipidemia: Secondary | ICD-10-CM

## 2020-07-18 DIAGNOSIS — E669 Obesity, unspecified: Secondary | ICD-10-CM

## 2020-07-18 DIAGNOSIS — G629 Polyneuropathy, unspecified: Secondary | ICD-10-CM

## 2020-07-18 DIAGNOSIS — D709 Neutropenia, unspecified: Secondary | ICD-10-CM

## 2020-07-18 DIAGNOSIS — Z9181 History of falling: Secondary | ICD-10-CM

## 2020-07-18 DIAGNOSIS — F1910 Other psychoactive substance abuse, uncomplicated: Secondary | ICD-10-CM

## 2020-07-18 DIAGNOSIS — Z87891 Personal history of nicotine dependence: Secondary | ICD-10-CM

## 2020-07-18 DIAGNOSIS — I2510 Atherosclerotic heart disease of native coronary artery without angina pectoris: Secondary | ICD-10-CM

## 2020-07-18 DIAGNOSIS — E440 Moderate protein-calorie malnutrition: Secondary | ICD-10-CM

## 2020-07-18 DIAGNOSIS — Z955 Presence of coronary angioplasty implant and graft: Secondary | ICD-10-CM

## 2020-07-18 DIAGNOSIS — I129 Hypertensive chronic kidney disease with stage 1 through stage 4 chronic kidney disease, or unspecified chronic kidney disease: Secondary | ICD-10-CM | POA: Diagnosis not present

## 2020-07-18 DIAGNOSIS — N1831 Chronic kidney disease, stage 3a: Secondary | ICD-10-CM | POA: Diagnosis not present

## 2020-07-18 DIAGNOSIS — G8929 Other chronic pain: Secondary | ICD-10-CM

## 2020-07-18 DIAGNOSIS — H409 Unspecified glaucoma: Secondary | ICD-10-CM

## 2020-07-18 DIAGNOSIS — M549 Dorsalgia, unspecified: Secondary | ICD-10-CM

## 2020-07-18 DIAGNOSIS — N401 Enlarged prostate with lower urinary tract symptoms: Secondary | ICD-10-CM

## 2020-07-18 DIAGNOSIS — R339 Retention of urine, unspecified: Secondary | ICD-10-CM

## 2020-07-18 DIAGNOSIS — G3184 Mild cognitive impairment, so stated: Secondary | ICD-10-CM

## 2020-07-18 DIAGNOSIS — C61 Malignant neoplasm of prostate: Secondary | ICD-10-CM | POA: Diagnosis not present

## 2020-07-18 DIAGNOSIS — F1099 Alcohol use, unspecified with unspecified alcohol-induced disorder: Secondary | ICD-10-CM

## 2020-07-18 NOTE — Telephone Encounter (Signed)
Has a follow-up by me in December, he needs earlier follow-up in the next couple of weeks, please arrange. I will be out of the office for few days, okay to follow-up with another provider

## 2020-07-18 NOTE — Telephone Encounter (Signed)
Caller: Maudie Mercury [facility]) Call back # [phone] exp [phone]  Corky Mull states she did a medication reconciliation with patient since he was recently released from the facility. According to [facility] patient stating he is not taking Seroquel 25 mg and a multi vitamin. However, patient states he is taking hctz 25 mg and flomax 0.4.

## 2020-07-18 NOTE — Telephone Encounter (Signed)
FYI

## 2020-07-18 NOTE — Telephone Encounter (Signed)
Plan of care signed and faxed back to [facility] at [phone]. Form sent for scanning.  

## 2020-07-19 DIAGNOSIS — R339 Retention of urine, unspecified: Secondary | ICD-10-CM | POA: Diagnosis not present

## 2020-07-19 DIAGNOSIS — G8929 Other chronic pain: Secondary | ICD-10-CM | POA: Diagnosis not present

## 2020-07-19 DIAGNOSIS — I2510 Atherosclerotic heart disease of native coronary artery without angina pectoris: Secondary | ICD-10-CM | POA: Diagnosis not present

## 2020-07-19 DIAGNOSIS — M549 Dorsalgia, unspecified: Secondary | ICD-10-CM | POA: Diagnosis not present

## 2020-07-19 DIAGNOSIS — N1831 Chronic kidney disease, stage 3a: Secondary | ICD-10-CM | POA: Diagnosis not present

## 2020-07-19 DIAGNOSIS — G629 Polyneuropathy, unspecified: Secondary | ICD-10-CM | POA: Diagnosis not present

## 2020-07-19 DIAGNOSIS — Z955 Presence of coronary angioplasty implant and graft: Secondary | ICD-10-CM | POA: Diagnosis not present

## 2020-07-19 DIAGNOSIS — Z87891 Personal history of nicotine dependence: Secondary | ICD-10-CM | POA: Diagnosis not present

## 2020-07-19 DIAGNOSIS — E440 Moderate protein-calorie malnutrition: Secondary | ICD-10-CM | POA: Diagnosis not present

## 2020-07-19 DIAGNOSIS — I129 Hypertensive chronic kidney disease with stage 1 through stage 4 chronic kidney disease, or unspecified chronic kidney disease: Secondary | ICD-10-CM | POA: Diagnosis not present

## 2020-07-19 DIAGNOSIS — Z9181 History of falling: Secondary | ICD-10-CM | POA: Diagnosis not present

## 2020-07-19 DIAGNOSIS — G3184 Mild cognitive impairment, so stated: Secondary | ICD-10-CM | POA: Diagnosis not present

## 2020-07-19 DIAGNOSIS — D519 Vitamin B12 deficiency anemia, unspecified: Secondary | ICD-10-CM | POA: Diagnosis not present

## 2020-07-19 DIAGNOSIS — D709 Neutropenia, unspecified: Secondary | ICD-10-CM | POA: Diagnosis not present

## 2020-07-19 DIAGNOSIS — H409 Unspecified glaucoma: Secondary | ICD-10-CM | POA: Diagnosis not present

## 2020-07-19 DIAGNOSIS — E782 Mixed hyperlipidemia: Secondary | ICD-10-CM | POA: Diagnosis not present

## 2020-07-21 DIAGNOSIS — E782 Mixed hyperlipidemia: Secondary | ICD-10-CM | POA: Diagnosis not present

## 2020-07-21 DIAGNOSIS — G3184 Mild cognitive impairment, so stated: Secondary | ICD-10-CM | POA: Diagnosis not present

## 2020-07-21 DIAGNOSIS — N1831 Chronic kidney disease, stage 3a: Secondary | ICD-10-CM | POA: Diagnosis not present

## 2020-07-21 DIAGNOSIS — M549 Dorsalgia, unspecified: Secondary | ICD-10-CM | POA: Diagnosis not present

## 2020-07-21 DIAGNOSIS — D519 Vitamin B12 deficiency anemia, unspecified: Secondary | ICD-10-CM | POA: Diagnosis not present

## 2020-07-21 DIAGNOSIS — Z87891 Personal history of nicotine dependence: Secondary | ICD-10-CM | POA: Diagnosis not present

## 2020-07-21 DIAGNOSIS — Z9181 History of falling: Secondary | ICD-10-CM | POA: Diagnosis not present

## 2020-07-21 DIAGNOSIS — H409 Unspecified glaucoma: Secondary | ICD-10-CM | POA: Diagnosis not present

## 2020-07-21 DIAGNOSIS — G629 Polyneuropathy, unspecified: Secondary | ICD-10-CM | POA: Diagnosis not present

## 2020-07-21 DIAGNOSIS — E440 Moderate protein-calorie malnutrition: Secondary | ICD-10-CM | POA: Diagnosis not present

## 2020-07-21 DIAGNOSIS — R339 Retention of urine, unspecified: Secondary | ICD-10-CM | POA: Diagnosis not present

## 2020-07-21 DIAGNOSIS — D709 Neutropenia, unspecified: Secondary | ICD-10-CM | POA: Diagnosis not present

## 2020-07-21 DIAGNOSIS — I2510 Atherosclerotic heart disease of native coronary artery without angina pectoris: Secondary | ICD-10-CM | POA: Diagnosis not present

## 2020-07-21 DIAGNOSIS — I129 Hypertensive chronic kidney disease with stage 1 through stage 4 chronic kidney disease, or unspecified chronic kidney disease: Secondary | ICD-10-CM | POA: Diagnosis not present

## 2020-07-21 DIAGNOSIS — Z955 Presence of coronary angioplasty implant and graft: Secondary | ICD-10-CM | POA: Diagnosis not present

## 2020-07-21 DIAGNOSIS — G8929 Other chronic pain: Secondary | ICD-10-CM | POA: Diagnosis not present

## 2020-07-26 DIAGNOSIS — D519 Vitamin B12 deficiency anemia, unspecified: Secondary | ICD-10-CM | POA: Diagnosis not present

## 2020-07-26 DIAGNOSIS — I129 Hypertensive chronic kidney disease with stage 1 through stage 4 chronic kidney disease, or unspecified chronic kidney disease: Secondary | ICD-10-CM | POA: Diagnosis not present

## 2020-07-26 DIAGNOSIS — M549 Dorsalgia, unspecified: Secondary | ICD-10-CM | POA: Diagnosis not present

## 2020-07-26 DIAGNOSIS — Z87891 Personal history of nicotine dependence: Secondary | ICD-10-CM | POA: Diagnosis not present

## 2020-07-26 DIAGNOSIS — H409 Unspecified glaucoma: Secondary | ICD-10-CM | POA: Diagnosis not present

## 2020-07-26 DIAGNOSIS — Z9181 History of falling: Secondary | ICD-10-CM | POA: Diagnosis not present

## 2020-07-26 DIAGNOSIS — E782 Mixed hyperlipidemia: Secondary | ICD-10-CM | POA: Diagnosis not present

## 2020-07-26 DIAGNOSIS — D709 Neutropenia, unspecified: Secondary | ICD-10-CM | POA: Diagnosis not present

## 2020-07-26 DIAGNOSIS — I2510 Atherosclerotic heart disease of native coronary artery without angina pectoris: Secondary | ICD-10-CM | POA: Diagnosis not present

## 2020-07-26 DIAGNOSIS — R339 Retention of urine, unspecified: Secondary | ICD-10-CM | POA: Diagnosis not present

## 2020-07-26 DIAGNOSIS — G629 Polyneuropathy, unspecified: Secondary | ICD-10-CM | POA: Diagnosis not present

## 2020-07-26 DIAGNOSIS — G3184 Mild cognitive impairment, so stated: Secondary | ICD-10-CM | POA: Diagnosis not present

## 2020-07-26 DIAGNOSIS — N1831 Chronic kidney disease, stage 3a: Secondary | ICD-10-CM | POA: Diagnosis not present

## 2020-07-26 DIAGNOSIS — E440 Moderate protein-calorie malnutrition: Secondary | ICD-10-CM | POA: Diagnosis not present

## 2020-07-26 DIAGNOSIS — G8929 Other chronic pain: Secondary | ICD-10-CM | POA: Diagnosis not present

## 2020-07-26 DIAGNOSIS — Z955 Presence of coronary angioplasty implant and graft: Secondary | ICD-10-CM | POA: Diagnosis not present

## 2020-07-27 DIAGNOSIS — E782 Mixed hyperlipidemia: Secondary | ICD-10-CM | POA: Diagnosis not present

## 2020-07-27 DIAGNOSIS — D709 Neutropenia, unspecified: Secondary | ICD-10-CM | POA: Diagnosis not present

## 2020-07-27 DIAGNOSIS — G629 Polyneuropathy, unspecified: Secondary | ICD-10-CM | POA: Diagnosis not present

## 2020-07-27 DIAGNOSIS — G3184 Mild cognitive impairment, so stated: Secondary | ICD-10-CM | POA: Diagnosis not present

## 2020-07-27 DIAGNOSIS — G8929 Other chronic pain: Secondary | ICD-10-CM | POA: Diagnosis not present

## 2020-07-27 DIAGNOSIS — I2510 Atherosclerotic heart disease of native coronary artery without angina pectoris: Secondary | ICD-10-CM | POA: Diagnosis not present

## 2020-07-27 DIAGNOSIS — N1831 Chronic kidney disease, stage 3a: Secondary | ICD-10-CM | POA: Diagnosis not present

## 2020-07-27 DIAGNOSIS — Z9181 History of falling: Secondary | ICD-10-CM | POA: Diagnosis not present

## 2020-07-27 DIAGNOSIS — M549 Dorsalgia, unspecified: Secondary | ICD-10-CM | POA: Diagnosis not present

## 2020-07-27 DIAGNOSIS — Z955 Presence of coronary angioplasty implant and graft: Secondary | ICD-10-CM | POA: Diagnosis not present

## 2020-07-27 DIAGNOSIS — D519 Vitamin B12 deficiency anemia, unspecified: Secondary | ICD-10-CM | POA: Diagnosis not present

## 2020-07-27 DIAGNOSIS — Z87891 Personal history of nicotine dependence: Secondary | ICD-10-CM | POA: Diagnosis not present

## 2020-07-27 DIAGNOSIS — E440 Moderate protein-calorie malnutrition: Secondary | ICD-10-CM | POA: Diagnosis not present

## 2020-07-27 DIAGNOSIS — I129 Hypertensive chronic kidney disease with stage 1 through stage 4 chronic kidney disease, or unspecified chronic kidney disease: Secondary | ICD-10-CM | POA: Diagnosis not present

## 2020-07-27 DIAGNOSIS — R339 Retention of urine, unspecified: Secondary | ICD-10-CM | POA: Diagnosis not present

## 2020-07-27 DIAGNOSIS — H409 Unspecified glaucoma: Secondary | ICD-10-CM | POA: Diagnosis not present

## 2020-08-03 DIAGNOSIS — D709 Neutropenia, unspecified: Secondary | ICD-10-CM | POA: Diagnosis not present

## 2020-08-03 DIAGNOSIS — Z87891 Personal history of nicotine dependence: Secondary | ICD-10-CM | POA: Diagnosis not present

## 2020-08-03 DIAGNOSIS — D519 Vitamin B12 deficiency anemia, unspecified: Secondary | ICD-10-CM | POA: Diagnosis not present

## 2020-08-03 DIAGNOSIS — G8929 Other chronic pain: Secondary | ICD-10-CM | POA: Diagnosis not present

## 2020-08-03 DIAGNOSIS — H409 Unspecified glaucoma: Secondary | ICD-10-CM | POA: Diagnosis not present

## 2020-08-03 DIAGNOSIS — E782 Mixed hyperlipidemia: Secondary | ICD-10-CM | POA: Diagnosis not present

## 2020-08-03 DIAGNOSIS — I2510 Atherosclerotic heart disease of native coronary artery without angina pectoris: Secondary | ICD-10-CM | POA: Diagnosis not present

## 2020-08-03 DIAGNOSIS — I129 Hypertensive chronic kidney disease with stage 1 through stage 4 chronic kidney disease, or unspecified chronic kidney disease: Secondary | ICD-10-CM | POA: Diagnosis not present

## 2020-08-03 DIAGNOSIS — M549 Dorsalgia, unspecified: Secondary | ICD-10-CM | POA: Diagnosis not present

## 2020-08-03 DIAGNOSIS — Z9181 History of falling: Secondary | ICD-10-CM | POA: Diagnosis not present

## 2020-08-03 DIAGNOSIS — E440 Moderate protein-calorie malnutrition: Secondary | ICD-10-CM | POA: Diagnosis not present

## 2020-08-03 DIAGNOSIS — R339 Retention of urine, unspecified: Secondary | ICD-10-CM | POA: Diagnosis not present

## 2020-08-03 DIAGNOSIS — Z955 Presence of coronary angioplasty implant and graft: Secondary | ICD-10-CM | POA: Diagnosis not present

## 2020-08-03 DIAGNOSIS — G629 Polyneuropathy, unspecified: Secondary | ICD-10-CM | POA: Diagnosis not present

## 2020-08-03 DIAGNOSIS — G3184 Mild cognitive impairment, so stated: Secondary | ICD-10-CM | POA: Diagnosis not present

## 2020-08-03 DIAGNOSIS — N1831 Chronic kidney disease, stage 3a: Secondary | ICD-10-CM | POA: Diagnosis not present

## 2020-08-05 NOTE — Telephone Encounter (Signed)
Unable to get in contact with patient ... [phone] rings with no answer/ no voice mail

## 2020-08-09 DIAGNOSIS — Z87891 Personal history of nicotine dependence: Secondary | ICD-10-CM | POA: Diagnosis not present

## 2020-08-09 DIAGNOSIS — G8929 Other chronic pain: Secondary | ICD-10-CM | POA: Diagnosis not present

## 2020-08-09 DIAGNOSIS — I2510 Atherosclerotic heart disease of native coronary artery without angina pectoris: Secondary | ICD-10-CM | POA: Diagnosis not present

## 2020-08-09 DIAGNOSIS — D709 Neutropenia, unspecified: Secondary | ICD-10-CM | POA: Diagnosis not present

## 2020-08-09 DIAGNOSIS — D519 Vitamin B12 deficiency anemia, unspecified: Secondary | ICD-10-CM | POA: Diagnosis not present

## 2020-08-09 DIAGNOSIS — N1831 Chronic kidney disease, stage 3a: Secondary | ICD-10-CM | POA: Diagnosis not present

## 2020-08-09 DIAGNOSIS — H409 Unspecified glaucoma: Secondary | ICD-10-CM | POA: Diagnosis not present

## 2020-08-09 DIAGNOSIS — R339 Retention of urine, unspecified: Secondary | ICD-10-CM | POA: Diagnosis not present

## 2020-08-09 DIAGNOSIS — G3184 Mild cognitive impairment, so stated: Secondary | ICD-10-CM | POA: Diagnosis not present

## 2020-08-09 DIAGNOSIS — M549 Dorsalgia, unspecified: Secondary | ICD-10-CM | POA: Diagnosis not present

## 2020-08-09 DIAGNOSIS — Z9181 History of falling: Secondary | ICD-10-CM | POA: Diagnosis not present

## 2020-08-09 DIAGNOSIS — E782 Mixed hyperlipidemia: Secondary | ICD-10-CM | POA: Diagnosis not present

## 2020-08-09 DIAGNOSIS — E440 Moderate protein-calorie malnutrition: Secondary | ICD-10-CM | POA: Diagnosis not present

## 2020-08-09 DIAGNOSIS — G629 Polyneuropathy, unspecified: Secondary | ICD-10-CM | POA: Diagnosis not present

## 2020-08-09 DIAGNOSIS — I129 Hypertensive chronic kidney disease with stage 1 through stage 4 chronic kidney disease, or unspecified chronic kidney disease: Secondary | ICD-10-CM | POA: Diagnosis not present

## 2020-08-09 DIAGNOSIS — Z955 Presence of coronary angioplasty implant and graft: Secondary | ICD-10-CM | POA: Diagnosis not present

## 2020-08-10 DIAGNOSIS — R339 Retention of urine, unspecified: Secondary | ICD-10-CM | POA: Diagnosis not present

## 2020-08-10 DIAGNOSIS — D519 Vitamin B12 deficiency anemia, unspecified: Secondary | ICD-10-CM | POA: Diagnosis not present

## 2020-08-10 DIAGNOSIS — Z9181 History of falling: Secondary | ICD-10-CM | POA: Diagnosis not present

## 2020-08-10 DIAGNOSIS — D709 Neutropenia, unspecified: Secondary | ICD-10-CM | POA: Diagnosis not present

## 2020-08-10 DIAGNOSIS — I2510 Atherosclerotic heart disease of native coronary artery without angina pectoris: Secondary | ICD-10-CM | POA: Diagnosis not present

## 2020-08-10 DIAGNOSIS — M549 Dorsalgia, unspecified: Secondary | ICD-10-CM | POA: Diagnosis not present

## 2020-08-10 DIAGNOSIS — G629 Polyneuropathy, unspecified: Secondary | ICD-10-CM | POA: Diagnosis not present

## 2020-08-10 DIAGNOSIS — G8929 Other chronic pain: Secondary | ICD-10-CM | POA: Diagnosis not present

## 2020-08-10 DIAGNOSIS — G3184 Mild cognitive impairment, so stated: Secondary | ICD-10-CM | POA: Diagnosis not present

## 2020-08-10 DIAGNOSIS — H409 Unspecified glaucoma: Secondary | ICD-10-CM | POA: Diagnosis not present

## 2020-08-10 DIAGNOSIS — I129 Hypertensive chronic kidney disease with stage 1 through stage 4 chronic kidney disease, or unspecified chronic kidney disease: Secondary | ICD-10-CM | POA: Diagnosis not present

## 2020-08-10 DIAGNOSIS — Z955 Presence of coronary angioplasty implant and graft: Secondary | ICD-10-CM | POA: Diagnosis not present

## 2020-08-10 DIAGNOSIS — E440 Moderate protein-calorie malnutrition: Secondary | ICD-10-CM | POA: Diagnosis not present

## 2020-08-10 DIAGNOSIS — N1831 Chronic kidney disease, stage 3a: Secondary | ICD-10-CM | POA: Diagnosis not present

## 2020-08-10 DIAGNOSIS — Z87891 Personal history of nicotine dependence: Secondary | ICD-10-CM | POA: Diagnosis not present

## 2020-08-10 DIAGNOSIS — E782 Mixed hyperlipidemia: Secondary | ICD-10-CM | POA: Diagnosis not present

## 2020-08-15 DIAGNOSIS — Z87891 Personal history of nicotine dependence: Secondary | ICD-10-CM | POA: Diagnosis not present

## 2020-08-15 DIAGNOSIS — I129 Hypertensive chronic kidney disease with stage 1 through stage 4 chronic kidney disease, or unspecified chronic kidney disease: Secondary | ICD-10-CM | POA: Diagnosis not present

## 2020-08-15 DIAGNOSIS — N1831 Chronic kidney disease, stage 3a: Secondary | ICD-10-CM | POA: Diagnosis not present

## 2020-08-15 DIAGNOSIS — I2510 Atherosclerotic heart disease of native coronary artery without angina pectoris: Secondary | ICD-10-CM | POA: Diagnosis not present

## 2020-08-15 DIAGNOSIS — Z955 Presence of coronary angioplasty implant and graft: Secondary | ICD-10-CM | POA: Diagnosis not present

## 2020-08-15 DIAGNOSIS — R339 Retention of urine, unspecified: Secondary | ICD-10-CM | POA: Diagnosis not present

## 2020-08-15 DIAGNOSIS — E440 Moderate protein-calorie malnutrition: Secondary | ICD-10-CM | POA: Diagnosis not present

## 2020-08-15 DIAGNOSIS — G3184 Mild cognitive impairment, so stated: Secondary | ICD-10-CM | POA: Diagnosis not present

## 2020-08-15 DIAGNOSIS — D519 Vitamin B12 deficiency anemia, unspecified: Secondary | ICD-10-CM | POA: Diagnosis not present

## 2020-08-15 DIAGNOSIS — E782 Mixed hyperlipidemia: Secondary | ICD-10-CM | POA: Diagnosis not present

## 2020-08-15 DIAGNOSIS — M549 Dorsalgia, unspecified: Secondary | ICD-10-CM | POA: Diagnosis not present

## 2020-08-15 DIAGNOSIS — G8929 Other chronic pain: Secondary | ICD-10-CM | POA: Diagnosis not present

## 2020-08-15 DIAGNOSIS — G629 Polyneuropathy, unspecified: Secondary | ICD-10-CM | POA: Diagnosis not present

## 2020-08-15 DIAGNOSIS — H409 Unspecified glaucoma: Secondary | ICD-10-CM | POA: Diagnosis not present

## 2020-08-15 DIAGNOSIS — D709 Neutropenia, unspecified: Secondary | ICD-10-CM | POA: Diagnosis not present

## 2020-08-15 DIAGNOSIS — Z9181 History of falling: Secondary | ICD-10-CM | POA: Diagnosis not present

## 2020-11-04 ENCOUNTER — Encounter: Payer: Medicare Other | Admitting: Internal Medicine

## 2020-11-04 ENCOUNTER — Encounter: Payer: Self-pay | Admitting: Internal Medicine

## 2020-11-29 ENCOUNTER — Emergency Department (HOSPITAL_COMMUNITY): Payer: Medicare Other

## 2020-11-29 ENCOUNTER — Encounter: Payer: Self-pay | Admitting: Internal Medicine

## 2020-11-29 ENCOUNTER — Encounter (HOSPITAL_COMMUNITY): Payer: Self-pay | Admitting: Internal Medicine

## 2020-11-29 ENCOUNTER — Ambulatory Visit (INDEPENDENT_AMBULATORY_CARE_PROVIDER_SITE_OTHER): Payer: Medicare Other | Admitting: Internal Medicine

## 2020-11-29 ENCOUNTER — Other Ambulatory Visit: Payer: Self-pay

## 2020-11-29 ENCOUNTER — Inpatient Hospital Stay (HOSPITAL_COMMUNITY)
Admission: EM | Admit: 2020-11-29 | Discharge: 2020-12-27 | DRG: 435 | Disposition: E | Payer: Medicare Other | Attending: Internal Medicine | Admitting: Internal Medicine

## 2020-11-29 VITALS — BP 135/91 | HR 97 | Temp 97.7°F | Resp 18 | Ht 72.0 in | Wt 187.5 lb

## 2020-11-29 DIAGNOSIS — I129 Hypertensive chronic kidney disease with stage 1 through stage 4 chronic kidney disease, or unspecified chronic kidney disease: Secondary | ICD-10-CM | POA: Diagnosis not present

## 2020-11-29 DIAGNOSIS — K254 Chronic or unspecified gastric ulcer with hemorrhage: Secondary | ICD-10-CM | POA: Diagnosis present

## 2020-11-29 DIAGNOSIS — K259 Gastric ulcer, unspecified as acute or chronic, without hemorrhage or perforation: Secondary | ICD-10-CM

## 2020-11-29 DIAGNOSIS — K8689 Other specified diseases of pancreas: Secondary | ICD-10-CM | POA: Diagnosis not present

## 2020-11-29 DIAGNOSIS — Z800 Family history of malignant neoplasm of digestive organs: Secondary | ICD-10-CM | POA: Diagnosis not present

## 2020-11-29 DIAGNOSIS — K2100 Gastro-esophageal reflux disease with esophagitis, without bleeding: Secondary | ICD-10-CM | POA: Diagnosis not present

## 2020-11-29 DIAGNOSIS — D509 Iron deficiency anemia, unspecified: Secondary | ICD-10-CM | POA: Diagnosis not present

## 2020-11-29 DIAGNOSIS — Z923 Personal history of irradiation: Secondary | ICD-10-CM

## 2020-11-29 DIAGNOSIS — K922 Gastrointestinal hemorrhage, unspecified: Secondary | ICD-10-CM | POA: Diagnosis present

## 2020-11-29 DIAGNOSIS — R627 Adult failure to thrive: Secondary | ICD-10-CM | POA: Diagnosis present

## 2020-11-29 DIAGNOSIS — Z8546 Personal history of malignant neoplasm of prostate: Secondary | ICD-10-CM

## 2020-11-29 DIAGNOSIS — I700 Atherosclerosis of aorta: Secondary | ICD-10-CM | POA: Diagnosis not present

## 2020-11-29 DIAGNOSIS — Z87440 Personal history of urinary (tract) infections: Secondary | ICD-10-CM

## 2020-11-29 DIAGNOSIS — I469 Cardiac arrest, cause unspecified: Secondary | ICD-10-CM | POA: Diagnosis not present

## 2020-11-29 DIAGNOSIS — R634 Abnormal weight loss: Secondary | ICD-10-CM

## 2020-11-29 DIAGNOSIS — D62 Acute posthemorrhagic anemia: Secondary | ICD-10-CM | POA: Diagnosis not present

## 2020-11-29 DIAGNOSIS — E785 Hyperlipidemia, unspecified: Secondary | ICD-10-CM | POA: Diagnosis not present

## 2020-11-29 DIAGNOSIS — E43 Unspecified severe protein-calorie malnutrition: Secondary | ICD-10-CM | POA: Diagnosis not present

## 2020-11-29 DIAGNOSIS — R140 Abdominal distension (gaseous): Secondary | ICD-10-CM | POA: Diagnosis not present

## 2020-11-29 DIAGNOSIS — M549 Dorsalgia, unspecified: Secondary | ICD-10-CM | POA: Diagnosis present

## 2020-11-29 DIAGNOSIS — Z7982 Long term (current) use of aspirin: Secondary | ICD-10-CM

## 2020-11-29 DIAGNOSIS — Z87891 Personal history of nicotine dependence: Secondary | ICD-10-CM

## 2020-11-29 DIAGNOSIS — E8809 Other disorders of plasma-protein metabolism, not elsewhere classified: Secondary | ICD-10-CM | POA: Diagnosis not present

## 2020-11-29 DIAGNOSIS — Z20822 Contact with and (suspected) exposure to covid-19: Secondary | ICD-10-CM | POA: Diagnosis not present

## 2020-11-29 DIAGNOSIS — K3189 Other diseases of stomach and duodenum: Secondary | ICD-10-CM | POA: Diagnosis not present

## 2020-11-29 DIAGNOSIS — C49A2 Gastrointestinal stromal tumor of stomach: Secondary | ICD-10-CM | POA: Diagnosis not present

## 2020-11-29 DIAGNOSIS — Z955 Presence of coronary angioplasty implant and graft: Secondary | ICD-10-CM

## 2020-11-29 DIAGNOSIS — K269 Duodenal ulcer, unspecified as acute or chronic, without hemorrhage or perforation: Secondary | ICD-10-CM

## 2020-11-29 DIAGNOSIS — F0390 Unspecified dementia without behavioral disturbance: Secondary | ICD-10-CM | POA: Diagnosis present

## 2020-11-29 DIAGNOSIS — N179 Acute kidney failure, unspecified: Secondary | ICD-10-CM | POA: Diagnosis not present

## 2020-11-29 DIAGNOSIS — R1902 Left upper quadrant abdominal swelling, mass and lump: Secondary | ICD-10-CM

## 2020-11-29 DIAGNOSIS — K2980 Duodenitis without bleeding: Secondary | ICD-10-CM | POA: Diagnosis not present

## 2020-11-29 DIAGNOSIS — Z9049 Acquired absence of other specified parts of digestive tract: Secondary | ICD-10-CM

## 2020-11-29 DIAGNOSIS — C259 Malignant neoplasm of pancreas, unspecified: Secondary | ICD-10-CM | POA: Diagnosis not present

## 2020-11-29 DIAGNOSIS — G8929 Other chronic pain: Secondary | ICD-10-CM | POA: Diagnosis present

## 2020-11-29 DIAGNOSIS — N1831 Chronic kidney disease, stage 3a: Secondary | ICD-10-CM | POA: Diagnosis present

## 2020-11-29 DIAGNOSIS — Z8249 Family history of ischemic heart disease and other diseases of the circulatory system: Secondary | ICD-10-CM

## 2020-11-29 DIAGNOSIS — R195 Other fecal abnormalities: Secondary | ICD-10-CM | POA: Diagnosis not present

## 2020-11-29 DIAGNOSIS — H409 Unspecified glaucoma: Secondary | ICD-10-CM | POA: Diagnosis present

## 2020-11-29 DIAGNOSIS — D500 Iron deficiency anemia secondary to blood loss (chronic): Secondary | ICD-10-CM | POA: Diagnosis not present

## 2020-11-29 DIAGNOSIS — E538 Deficiency of other specified B group vitamins: Secondary | ICD-10-CM | POA: Diagnosis present

## 2020-11-29 DIAGNOSIS — R001 Bradycardia, unspecified: Secondary | ICD-10-CM | POA: Diagnosis not present

## 2020-11-29 DIAGNOSIS — Z6825 Body mass index (BMI) 25.0-25.9, adult: Secondary | ICD-10-CM | POA: Diagnosis not present

## 2020-11-29 DIAGNOSIS — C787 Secondary malignant neoplasm of liver and intrahepatic bile duct: Secondary | ICD-10-CM | POA: Diagnosis not present

## 2020-11-29 DIAGNOSIS — K264 Chronic or unspecified duodenal ulcer with hemorrhage: Secondary | ICD-10-CM | POA: Diagnosis not present

## 2020-11-29 DIAGNOSIS — D490 Neoplasm of unspecified behavior of digestive system: Secondary | ICD-10-CM | POA: Diagnosis not present

## 2020-11-29 DIAGNOSIS — Z79899 Other long term (current) drug therapy: Secondary | ICD-10-CM

## 2020-11-29 LAB — COMPREHENSIVE METABOLIC PANEL
ALT: 12 U/L (ref 0–44)
AST: 27 U/L (ref 15–41)
Albumin: 3 g/dL — ABNORMAL LOW (ref 3.5–5.0)
Alkaline Phosphatase: 70 U/L (ref 38–126)
Anion gap: 14 (ref 5–15)
BUN: 21 mg/dL (ref 8–23)
CO2: 20 mmol/L — ABNORMAL LOW (ref 22–32)
Calcium: 9.4 mg/dL (ref 8.9–10.3)
Chloride: 102 mmol/L (ref 98–111)
Creatinine, Ser: 1.47 mg/dL — ABNORMAL HIGH (ref 0.61–1.24)
GFR, Estimated: 52 mL/min — ABNORMAL LOW (ref >60)
Glucose, Bld: 93 mg/dL (ref 70–99)
Potassium: 4 mmol/L (ref 3.5–5.1)
Sodium: 136 mmol/L (ref 135–145)
Total Bilirubin: 1.3 mg/dL — ABNORMAL HIGH (ref 0.3–1.2)
Total Protein: 7.3 g/dL (ref 6.5–8.1)

## 2020-11-29 LAB — PREALBUMIN: Prealbumin: 6.3 mg/dL — ABNORMAL LOW (ref 18–38)

## 2020-11-29 LAB — TYPE AND SCREEN
ABO/RH(D): O POS
Antibody Screen: NEGATIVE

## 2020-11-29 LAB — CBC WITH DIFFERENTIAL/PLATELET
Abs Immature Granulocytes: 0.03 10*3/uL (ref 0.00–0.07)
Basophils Absolute: 0.1 10*3/uL (ref 0.0–0.1)
Basophils Relative: 2 %
Eosinophils Absolute: 0.1 10*3/uL (ref 0.0–0.5)
Eosinophils Relative: 2 %
HCT: 32.7 % — ABNORMAL LOW (ref 39.0–52.0)
Hemoglobin: 9.9 g/dL — ABNORMAL LOW (ref 13.0–17.0)
Immature Granulocytes: 1 %
Lymphocytes Relative: 14 %
Lymphs Abs: 0.8 10*3/uL (ref 0.7–4.0)
MCH: 23.7 pg — ABNORMAL LOW (ref 26.0–34.0)
MCHC: 30.3 g/dL (ref 30.0–36.0)
MCV: 78.4 fL — ABNORMAL LOW (ref 80.0–100.0)
Monocytes Absolute: 0.9 10*3/uL (ref 0.1–1.0)
Monocytes Relative: 15 %
Neutro Abs: 4.1 10*3/uL (ref 1.7–7.7)
Neutrophils Relative %: 66 %
Platelets: 359 10*3/uL (ref 150–400)
RBC: 4.17 MIL/uL — ABNORMAL LOW (ref 4.22–5.81)
RDW: 17.6 % — ABNORMAL HIGH (ref 11.5–15.5)
WBC: 6.1 10*3/uL (ref 4.0–10.5)
nRBC: 0 % (ref 0.0–0.2)

## 2020-11-29 LAB — PROTIME-INR
INR: 1.2 (ref 0.8–1.2)
Prothrombin Time: 14.3 seconds (ref 11.4–15.2)

## 2020-11-29 LAB — IRON AND TIBC
Iron: 17 ug/dL — ABNORMAL LOW (ref 45–182)
Saturation Ratios: 5 % — ABNORMAL LOW (ref 17.9–39.5)
TIBC: 323 ug/dL (ref 250–450)
UIBC: 306 ug/dL

## 2020-11-29 LAB — ABO/RH: ABO/RH(D): O POS

## 2020-11-29 LAB — SARS CORONAVIRUS 2 (TAT 6-24 HRS): SARS Coronavirus 2: NEGATIVE

## 2020-11-29 LAB — FERRITIN: Ferritin: 35 ng/mL (ref 24–336)

## 2020-11-29 LAB — POC OCCULT BLOOD, ED: Fecal Occult Bld: POSITIVE — AB

## 2020-11-29 LAB — VITAMIN B12: Vitamin B-12: 633 pg/mL (ref 180–914)

## 2020-11-29 LAB — LIPASE, BLOOD: Lipase: 107 U/L — ABNORMAL HIGH (ref 11–51)

## 2020-11-29 MED ORDER — PRAVASTATIN SODIUM 10 MG PO TABS
20.0000 mg | ORAL_TABLET | Freq: Every day | ORAL | Status: DC
  Administered 2020-11-29 – 2020-11-30 (×2): 20 mg via ORAL
  Filled 2020-11-29 (×2): qty 2

## 2020-11-29 MED ORDER — QUETIAPINE FUMARATE 25 MG PO TABS
25.0000 mg | ORAL_TABLET | Freq: Every day | ORAL | Status: DC
  Administered 2020-11-29: 25 mg via ORAL
  Filled 2020-11-29: qty 1

## 2020-11-29 MED ORDER — ACETAMINOPHEN 650 MG RE SUPP: 650.0000 mg | Freq: Four times a day (QID) | RECTAL | Status: DC | PRN

## 2020-11-29 MED ORDER — ACETAMINOPHEN 325 MG PO TABS: 650.0000 mg | ORAL_TABLET | Freq: Four times a day (QID) | ORAL | Status: DC | PRN

## 2020-11-29 MED ORDER — ONDANSETRON HCL 4 MG PO TABS: 4.0000 mg | ORAL_TABLET | Freq: Four times a day (QID) | ORAL | Status: DC | PRN

## 2020-11-29 MED ORDER — ENSURE ENLIVE PO LIQD
237.0000 mL | Freq: Three times a day (TID) | ORAL | Status: DC
  Administered 2020-11-30: 237 mL via ORAL
  Filled 2020-11-29: qty 237

## 2020-11-29 MED ORDER — SODIUM CHLORIDE 0.9 % IV SOLN: INTRAVENOUS | Status: DC

## 2020-11-29 MED ORDER — ONDANSETRON HCL 4 MG/2ML IJ SOLN: 4.0000 mg | Freq: Four times a day (QID) | INTRAMUSCULAR | Status: DC | PRN

## 2020-11-29 MED ORDER — SODIUM CHLORIDE 0.9% FLUSH
3.0000 mL | Freq: Two times a day (BID) | INTRAVENOUS | Status: DC
  Administered 2020-11-29 – 2020-11-30 (×2): 3 mL via INTRAVENOUS

## 2020-11-29 MED ORDER — ALBUTEROL SULFATE (2.5 MG/3ML) 0.083% IN NEBU: 2.5000 mg | INHALATION_SOLUTION | Freq: Four times a day (QID) | RESPIRATORY_TRACT | Status: DC | PRN

## 2020-11-29 NOTE — ED Provider Notes (Signed)
  Face-to-face evaluation   History: Patient presents from his physician's office for evaluation of weight loss and abdominal mass.  He is unable to give any history secondary to dementia.  Physical exam: Elderly male who is alert and appears comfortable.  Abdomen with large mass, consistent with splenomegaly.  Abdomen is nontender to palpation.  Patient is nontoxic in appearance.  Medical screening examination/treatment/procedure(s) were conducted as a shared visit with non-physician practitioner(s) and myself.  I personally evaluated the patient during the encounter    Mancel Bale, MD December 30, 2020 [phone]

## 2020-11-29 NOTE — ED Triage Notes (Signed)
[facility] reports pt is direct admit from PCP at [facility] at [facility].  Per provider note, Pt has hx of dementia with no resources here to assist with daily living. Pt has had significant weight loss and unsure if its inability to get food or inability to eat/lack of appetite. Pt has abd mass needs further follow up.

## 2020-11-29 NOTE — H&P (Signed)
History and Physical    Edward Stafford QIH:[id number] DOB: 1951-11-10 DOA: 12/15/2020  Referring MD/NP/PA: Gust Brooms, PA-C PCP: Colon Branch, MD  Patient coming from: [facility] via EMS  Chief Complaint: Weight loss  I have personally briefly reviewed patient's old medical records in [place]   HPI: Edward Stafford is a 70 y.o. male with medical history significant of essential hypertension, hyperlipidemia, CKD stage III GERD, and prostate cancer s/p radioactive seed placement presented for what he reports as weight loss of 56 pounds of the last month.  He reports that he has no appetite at all and has had nausea with dry heaves.  He denies any change in his stools and reports that they have been a deep brown color.  Patient has a roommate who lives with him, but reports being able to care for himself without need of assistance.  Normally ambulating with use of a cane. His "sister" who is nonbiological states that last year he had been more confused and forgetful for which they were concerned he had dementia. He was admitted into the hospital in July 2021 after having a fall and was worked up for the confusion and shuffling gait. Patient was diagnosed with symptomatic B12 deficiency for which vitamin B12 shots were given and patient had significant improvement and was back to his normal self she reported. He has been in assisted housing, and allowed this person to stay with him that she does not notice good for him. She reports that she had noticed that he had been losing a lot of weight and had kept encouraging him to go to the doctor to figure out what was going on. She reports that there is a significant history of pancreatic cancer in his family in multiple cousins. Some first-degree and some more distant.  Patient drove himself to the doctor's office today to be evaluated in regards to the weight loss.    ED Course: Upon admission into the  emergency department patient was seen to be afebrile with blood pressure 135/91-163/82, and all other vital signs maintained.  Labs significant for hemoglobin 9.9, CO2 20, BUN 21, creatinine 1.47, albumin 3, lipase 107, total bilirubin 1.3, and all other labs relatively within normal limits. CT scan of the abdomen revealed a large 24 x 20 cm pancreatic mass with suspected metastases to the liver.  Review of Systems  Constitutional: Positive for weight loss. Negative for fever.  HENT: Negative for ear discharge and nosebleeds.   Eyes: Negative for photophobia and pain.  Respiratory: Negative for cough and shortness of breath.   Cardiovascular: Negative for chest pain and leg swelling.  Gastrointestinal: Positive for nausea and vomiting. Negative for abdominal pain.  Genitourinary: Negative for dysuria and hematuria.  Musculoskeletal: Negative for joint pain.  Skin: Negative for rash.  Neurological: Positive for weakness. Negative for loss of consciousness and headaches.  Psychiatric/Behavioral: Negative for substance abuse.    Past Medical History:  Diagnosis Date  . Back pain, chronic    disabled due to chronic back pain  . Chest pain    Cardiac cath (-)2008, Myoview (-) 2011  . Chronic kidney disease   . Drug abuse-- crack, marijuana 04/19/2007   hx of  . GERD (gastroesophageal reflux disease)    no current meds  . Glaucoma   . Hyperglycemia   . Hyperlipidemia   . Hypertension   . Prostate cancer ([place]) 06/2018  . SINUS BRADYCARDIA 05/17/2010  .  UTI (urinary tract infection)    on doxycycline for    Past Surgical History:  Procedure Laterality Date  . CHOLECYSTECTOMY    . CORONARY ANGIOPLASTY WITH STENT PLACEMENT    . PROSTATE BIOPSY    . RADIOACTIVE SEED IMPLANT N/A 11/14/2018   Procedure: RADIOACTIVE SEED IMPLANT/BRACHYTHERAPY IMPLANT;  Surgeon: Ardis Hughs, MD;  Location: [facility];  Service: Urology;  Laterality: N/A;  . SPACE OAR INSTILLATION  N/A 11/14/2018   Procedure: SPACE OAR INSTILLATION;  Surgeon: Ardis Hughs, MD;  Location: [facility];  Service: Urology;  Laterality: N/A;     reports that he quit smoking about 7 years ago. His smoking use included cigarettes. He has a 7.50 pack-year smoking history. He has never used smokeless tobacco. He reports previous drug use. He reports that he does not drink alcohol.  No Known Allergies  Family History  Problem Relation Age of Onset  . CAD Mother        age onset ?  . Colon cancer Other        cousin  . Stroke Neg Hx   . Prostate cancer Neg Hx   . Diabetes Neg Hx   . Breast cancer Neg Hx   . Pancreatic cancer Neg Hx     Prior to Admission medications   Medication Sig Start Date End Date Taking? Authorizing Provider  aspirin EC 81 MG tablet Take 81 mg by mouth daily.   Yes [provider]  feeding supplement, ENSURE ENLIVE, (ENSURE ENLIVE) LIQD Take 237 mLs by mouth 3 (three) times daily between meals. 06/23/20  Yes Sheikh, Omair Latif, DO  losartan (COZAAR) 25 MG tablet Take 1 tablet (25 mg total) by mouth every evening. 05/24/20  Yes Paz, Alda Berthold, MD  Multiple Vitamin (MULTIVITAMIN WITH MINERALS) TABS tablet Take 1 tablet by mouth daily. 06/23/20  Yes Sheikh, Omair Latif, DO  pravastatin (PRAVACHOL) 20 MG tablet Take 1 tablet (20 mg total) by mouth daily. 05/24/20  Yes Paz, Alda Berthold, MD  QUEtiapine (SEROQUEL) 25 MG tablet Take 1 tablet (25 mg total) by mouth at bedtime. 06/23/20  Yes Sheikh, Omair Latif, DO  tamsulosin (FLOMAX) 0.4 MG CAPS capsule Take 0.4 mg by mouth daily. 12/08/19  Yes [provider]    Physical Exam:  Constitutional: Thin elderly male who appears to be in no acute distress at this time Vitals:   12/18/2020 1155 12/20/2020 1158 11/28/2020 1230 12/14/2020 1245  BP:  (!) 152/97 (!) 148/101 (!) 163/82  Pulse:  69 70 60  Resp:  _0 Temp:  98.1 F (36.7 C)    TempSrc:  Oral    SpO2:  100% 100% 100%  Weight: 85 kg      Height: 6' (1.829 m)      Eyes: PERRL, lids and conjunctivae normal ENMT: Mucous membranes are moist. Posterior pharynx clear of any exudate or lesions.Edentulous Neck: normal, supple, no masses, no thyromegaly Respiratory: clear to auscultation bilaterally, no wheezing, no crackles. Normal respiratory effort. No accessory muscle use.  Cardiovascular: Regular rate and rhythm, no murmurs / rubs / gallops. No extremity edema. 2+ pedal pulses. No carotid bruits.  Abdomen: Palpable mass of the abdomen appreciated.  Bowel sounds present. Musculoskeletal: no clubbing / cyanosis. No joint deformity upper and lower extremities. Good ROM, no contractures. Normal muscle tone.  Skin: no rashes, lesions, ulcers. No induration Neurologic: CN 2-12 grossly intact. Sensation intact, DTR normal. Strength 5/5 in all 4.  Psychiatric: Normal judgment and insight and is able to remember his sister phone number as well as. Alert and oriented x 3. Normal mood.     Labs on Admission: I have personally reviewed following labs and imaging studies  CBC: Recent Labs  Lab 12/10/2020 1211  WBC 6.1  NEUTROABS 4.1  HGB 9.9*  HCT 32.7*  MCV 78.4*  PLT 784   Basic Metabolic Panel: Recent Labs  Lab 12/18/2020 1211  NA 136  K 4.0  CL 102  CO2 20*  GLUCOSE 93  BUN 21  CREATININE 1.47*  CALCIUM 9.4   GFR: Estimated Creatinine Clearance: 52.8 mL/min (A) (by C-G formula based on SCr of 1.47 mg/dL (H)). Liver Function Tests: Recent Labs  Lab 12/23/2020 1211  AST 27  ALT 12  ALKPHOS 70  BILITOT 1.3*  PROT 7.3  ALBUMIN 3.0*   Recent Labs  Lab 12/09/2020 1211  LIPASE 107*   No results for input(s): AMMONIA in the last 168 hours. Coagulation Profile: No results for input(s): INR, PROTIME in the last 168 hours. Cardiac Enzymes: No results for input(s): CKTOTAL, CKMB, CKMBINDEX, TROPONINI in the last 168 hours. BNP (last 3 results) No results for input(s): PROBNP in the last 8760 hours. HbA1C: No  results for input(s): HGBA1C in the last 72 hours. CBG: No results for input(s): GLUCAP in the last 168 hours. Lipid Profile: No results for input(s): CHOL, HDL, LDLCALC, TRIG, CHOLHDL, LDLDIRECT in the last 72 hours. Thyroid Function Tests: No results for input(s): TSH, T4TOTAL, FREET4, T3FREE, THYROIDAB in the last 72 hours. Anemia Panel: No results for input(s): VITAMINB12, FOLATE, FERRITIN, TIBC, IRON, RETICCTPCT in the last 72 hours. Urine analysis:    Component Value Date/Time   COLORURINE AMBER (A) 06/19/2020 1327   APPEARANCEUR HAZY (A) 06/19/2020 1327   LABSPEC 1.024 06/19/2020 1327   PHURINE 5.0 06/19/2020 1327   GLUCOSEU NEGATIVE 06/19/2020 1327   HGBUR SMALL (A) 06/19/2020 1327   HGBUR trace-intact 09/15/2010 0902   BILIRUBINUR NEGATIVE 06/19/2020 1327   BILIRUBINUR Negative 12/06/2017 0916   KETONESUR 5 (A) 06/19/2020 1327   PROTEINUR 100 (A) 06/19/2020 1327   UROBILINOGEN 0.2 12/06/2017 0916   UROBILINOGEN 0.2 04/16/2012 1636   NITRITE NEGATIVE 06/19/2020 1327   LEUKOCYTESUR NEGATIVE 06/19/2020 1327   Sepsis Labs: No results found for this or any previous visit (from the past 240 hour(s)).   Radiological Exams on Admission: CT ABDOMEN PELVIS WO CONTRAST  Result Date: 12/12/2020 CLINICAL DATA:  Weight loss, abdominal distention. EXAM: CT ABDOMEN AND PELVIS WITHOUT CONTRAST TECHNIQUE: Multidetector CT imaging of the abdomen and pelvis was performed following the standard protocol without IV contrast. COMPARISON:  None. FINDINGS: Lower chest: No acute abnormality. Hepatobiliary: Status post cholecystectomy. No biliary dilatation is noted. 18 mm rounded low density is noted in right hepatic lobe consistent with metastatic disease. Pancreas: 24 x 20 cm lobulated heterogeneous mass appears to be arising from the pancreatic body and tail highly concerning for malignancy. Low density areas are noted concerning for possible necrosis. No ductal dilatation is noted. Spleen: Normal  in size without focal abnormality. Adrenals/Urinary Tract: Adrenal glands are unremarkable. Kidneys are normal, without renal calculi, focal lesion, or hydronephrosis. Bladder is unremarkable. Stomach/Bowel: The stomach is displaced anteriorly by the probable large pancreatic mass. There does not appear to be any definite evidence of bowel obstruction or ileus. The appendix appears normal. Vascular/Lymphatic: Aortic atherosclerosis. No enlarged abdominal or pelvic lymph nodes. Reproductive: Status post prostatic brachytherapy seed placement. Other: No abdominal  wall hernia or abnormality. No abdominopelvic ascites. Musculoskeletal: No acute or significant osseous findings. IMPRESSION: 1. 24 x 20 cm lobulated heterogeneous mass appears to be arising from the pancreatic body and tail highly concerning for malignancy. Low density areas are noted concerning for possible necrosis. CT scan with intravenous contrast is recommended for further evaluation. 2. 18 mm rounded low density is noted in right hepatic lobe consistent with metastatic disease. 3. Status post prostatic brachytherapy seed placement. 4. Aortic atherosclerosis. Aortic Atherosclerosis (ICD10-I70.0). Electronically Signed   By: Marijo Conception M.D.   On: 12/19/2020 13:33    EKG: Independently reviewed. Sinus rhythm at 64 bpm  Assessment/Plan Pancreatic mass with suspected liver metastasis: Acute.  Patient reportedly has loss approximately 56 pounds of weight over the last month or so.  Noted to have a 24 x 20 cm mass on the pancreas with 18 mm right hepatic lobe lesion.  Family history is significant for pancreatic cancer and multiple cousins. -Admit to a MedSurg -N.p.o. after midnight -IR consulted for possible tissue diagnosis of liver met otherwise patient we will likely need EUS with gastroenterology in the outpatient setting -Consider obtaining CT scans with contrast of the chest if kidney function improves  -Transitions of care consulted  for likely need of assistance -Likely would benefit from a palliative care consult at some point in time  Acute blood loss anemia 2/2 GI bleed: Acute.  Patient presents with hemoglobin of 9.9 with low MCV and MCH.  Previous baseline noted to be around 11-12.  Stool guaiacs were ordered and found to be heme positive.  Last colonoscopy back in 2011 with did not note any significant abnormalities. -Hold aspirin -Check ferritin, iron, TIBC, B12 -Type and screen for possible need of blood product -GI formally consulted.  They are concerned that the pancreatic mass could be eroding into the upper GI tract leading to bleeding and we will try and obtain a biopsy at the EGD in a.m.  Acute kidney injury: Patient presents with creatinine elevated at 1.47 with BUN 21.  Baseline creatinine previously noted to be around 1.16 in 05/2020. -Check urinalysis -Gentle IV fluids at 75 mL/h  Essential hypertension: Patient appears hemodynamically stable.  Home medications include losartan 25 mg daily.   -Hold losartan due to AKI and GI bleed, but consider restarting when medically appropriate  Hyperbilirubinemia: Acute.  Total bilirubin is mild elevated 1.3 on admission.  Liver studies appear within normal limits.  -Recheck studies in a.m.  Failure to thrive/hypoalbuminemia: Acute.  Initial albumin noted to be 3 on admission. -Check prealbumin in a.m. to determine protein calorie malnutrition -Continue Ensure with meals  Dementia: Patient reported that he should not be driving and reported to have significant dementia per PCP. Patient was thought to have signs of dementia with falls that were thought to be related to vitamin B12 deficiency in July of last year. However, appears alert and oriented x4 at this time.  He appears competent and likely able to make his own decisions. -May warrant psychiatric evaluation for competency  History of prostate cancer: Patient status post radioactive seed placement in  10/2018.  History of polysubstance abuse: Patient has a prior history of cocaine use back in 2013. Denies any use recently -Follow-up urine drug  DVT prophylaxis: SCD Code Status: Full Family Communication: Sister updated over the phone Disposition Plan: To be determined Consults called: IR and GI Admission status: Inpatient, require more than 2 midnight stay and evaluation of acute kidney injury, pancreatic mass,  and possible GI bleed  Norval Morton MD [facility]   If 7PM-7AM, please contact night-coverage   12/17/2020, 2:43 PM

## 2020-11-29 NOTE — Progress Notes (Signed)
Pre visit review using our clinic review tool, if applicable. No additional management support is needed unless otherwise documented below in the visit note. 

## 2020-11-29 NOTE — Consult Note (Addendum)
[facility] Consult: 3:45 PM 12/15/2020  LOS: 0 days     Referring Provider: Dr Harvest Forest  Primary Care Physician:  Colon Branch, MD Primary Gastroenterologist:  none   Reason for Consultation:  Pancreatic, liver masses.  Anemia,  FOBT +   HPI: Edward Stafford is a 70 y.o. male.  See list below for past medical hx.  Hx dementia.  Prostate cancer, treated with radiation seed implant 10/2018.  2002 cholecystectomy. 09/2010 colonoscopy, average risk screening, Dr Sharlett Iles.  Unremarkable study to the IC valve.  Has not had upper endoscopy or a subsequent colonoscopy.  Seen at PCP office morning re: weight loss, poor p.o. intake.  For about a month he has had early satiety, anorexia and reports 60 pound weight loss.  82.7 kg in late July 2021, 85 kg today, 5 plus months later.   MD noted  abdominal mass on exam.  Referred to the ED and now being admitted to [facility] hospitalist service. CTAP w/o contrast: 24 x 20 mass arising from pancreatic body and tail concerning for malignancy w areas of low density concerning for necrosis.  Mass is displacing the stomach.  Contrasted CT recommended for further detailed eval.  18 mm density in right hepatic lobe consistent with mets.  Brachytherapy seed visible in prostate.  Aortic atherosclerosis Lipase 107.  LFTs normal.  Hgb 9.9, was 12 five months ago.  MCV low 78.  Social hx Retired from his work as a Education officer, community.  Rents an apartment/home.  Has a roommate but the roommate has to move out at the request of the landlady.  Patient sister lives nearby.  Smokes 2 cigarettes daily.  Drinks Shearon Stalls.  A pint lasts 2 weeks or more.  Denies illicit drug use: Marijuana, cocaine, opiates.  Family hx Did not know anything about his father or his father side of the family.  His  mother died of congestive heart failure.    Past Medical History:  Diagnosis Date  . Back pain, chronic    disabled due to chronic back pain  . Chest pain    Cardiac cath (-)2008, Myoview (-) 2011  . Chronic kidney disease   . Drug abuse-- crack, marijuana 04/19/2007   hx of  . GERD (gastroesophageal reflux disease)    no current meds  . Glaucoma   . Hyperglycemia   . Hyperlipidemia   . Hypertension   . Prostate cancer ([place]) 06/2018  . SINUS BRADYCARDIA 05/17/2010  . UTI (urinary tract infection)    on doxycycline for    Past Surgical History:  Procedure Laterality Date  . CHOLECYSTECTOMY    . CORONARY ANGIOPLASTY WITH STENT PLACEMENT    . PROSTATE BIOPSY    . RADIOACTIVE SEED IMPLANT N/A 11/14/2018   Procedure: RADIOACTIVE SEED IMPLANT/BRACHYTHERAPY IMPLANT;  Surgeon: Ardis Hughs, MD;  Location: [facility];  Service: Urology;  Laterality: N/A;  . SPACE OAR INSTILLATION N/A 11/14/2018   Procedure: SPACE OAR INSTILLATION;  Surgeon: Ardis Hughs, MD;  Location: [facility];  Service:  Urology;  Laterality: N/A;    Prior to Admission medications   Medication Sig Start Date End Date Taking? Authorizing Provider  aspirin EC 81 MG tablet Take 81 mg by mouth daily.    [provider]  feeding supplement, ENSURE ENLIVE, (ENSURE ENLIVE) LIQD Take 237 mLs by mouth 3 (three) times daily between meals. 06/23/20   Raiford Noble Latif, DO  losartan (COZAAR) 25 MG tablet Take 1 tablet (25 mg total) by mouth every evening. 05/24/20   Colon Branch, MD  Multiple Vitamin (MULTIVITAMIN WITH MINERALS) TABS tablet Take 1 tablet by mouth daily. 06/23/20   Raiford Noble Latif, DO  pravastatin (PRAVACHOL) 20 MG tablet Take 1 tablet (20 mg total) by mouth daily. 05/24/20   Colon Branch, MD  QUEtiapine (SEROQUEL) 25 MG tablet Take 1 tablet (25 mg total) by mouth at bedtime. 06/23/20   Raiford Noble Latif, DO  tamsulosin (FLOMAX) 0.4 MG CAPS capsule Take  0.4 mg by mouth daily. Patient not taking: Reported on 11/28/2020 12/08/19   [provider]    Scheduled Meds:  Infusions:  PRN Meds:    Allergies as of 12/25/2020  . (No Known Allergies)    Family History  Problem Relation Age of Onset  . CAD Mother        age onset ?  . Colon cancer Other        cousin  . Stroke Neg Hx   . Prostate cancer Neg Hx   . Diabetes Neg Hx   . Breast cancer Neg Hx   . Pancreatic cancer Neg Hx     Social History   Socioeconomic History  . Marital status: Single    Spouse name: Not on file  . Number of children: 0  . Years of education: Not on file  . Highest education level: Not on file  Occupational History  . Occupation: Disabled.   Tobacco Use  . Smoking status: Former Smoker    Packs/day: 0.50    Years: 15.00    Pack years: 7.50    Types: Cigarettes    Quit date: 06/15/2013    Years since quitting: 7.4  . Smokeless tobacco: Never Used  . Tobacco comment: never heavy smoker   Vaping Use  . Vaping Use: Never used  Substance and Sexual Activity  . Alcohol use: No    Comment: none  . Drug use: Not Currently    Comment: denies since ~ 2014, h/o marijuana-crack  . Sexual activity: Not Currently  Other Topics Concern  . Not on file  Social History Narrative   Lives w/ a room mate   has a car.    Has a sister?   Has a nephew  Dr Clotilde Dieter, radiologust, [place]   Has a brother in [place]   Social Determinants of Health   Financial Resource Strain: Low Risk   . Difficulty of Paying Living Expenses: Not hard at all  Food Insecurity: No Food Insecurity  . Worried About Charity fundraiser in the Last Year: Never true  . Ran Out of Food in the Last Year: Never true  Transportation Needs: No Transportation Needs  . Lack of Transportation (Medical): No  . Lack of Transportation (Non-Medical): No  Physical Activity: Not on file  Stress: Not on file  Social Connections: Not on file  Intimate Partner  Violence: Not on file    REVIEW OF SYSTEMS: Constitutional: Some fatigue, some weakness but nothing profound. ENT:  No nose bleeds  Pulm: No shortness of breath, no cough CV:  No palpitations, no LE edema.  No angina GU:  No hematuria, no frequency, no dark-colored urine.  No oliguria GI: See HPI.  No dysphagia. Heme: No unusual bleeding or bruising. Transfusions: No previous transfusion with blood products. Neuro:  No headaches, no peripheral tingling or numbness Derm:  No itching, no rash or sores.  Endocrine:  No sweats or chills.  No polyuria or dysuria Immunization: Reviewed.  Up-to-date on multiple vaccinations including Pfizer Covid 19, had the booster on 11/01/2020 Travel:  None beyond local counties in last few months.    PHYSICAL EXAM: Vital signs in last 24 hours: Vitals:   12/16/2020 1230 12/24/2020 1245  BP: (!) 148/101 (!) 163/82  Pulse: 70 60  Resp: 19 19  Temp:    SpO2: 100% 100%   Wt Readings from Last 3 Encounters:  12/19/2020 85 kg  12/12/2020 85 kg  06/19/20 (!) 102.7 kg    General: Pleasant, looks mildly ill but not toxic.  Comfortable. Head: No facial asymmetry or swelling.  No signs of head trauma. Eyes: No scleral icterus or conjunctival pallor.  EOMI Ears: Not hard of hearing Nose: No discharge Mouth: No teeth.  Mucosa is pink, moist, clear.  Tongue is midline. Neck: No masses, JVD, thyromegaly Lungs: Clear bilaterally without labored breathing or cough. Heart: RRR.  No MRG.  S1, S2 present Abdomen: Soft without tenderness.  Bowel sounds active.  No distention.  Distinct large mass vs splenomegaly occupying most of the left upper quadrant to at least the level of umbilicus.  No hernias, bruits..   Rectal: Deferred.  Rapidly FOBT positive per admitting MD, not clear if dark colored or brown. Musc/Skeltl: No joint redness, swelling or gross deformity. Extremities: No CCE. Neurologic: Oriented x3.  Does not seem confused to me.  Speech is fluid.  No  tremors, moves all 4 limbs.  Strength not tested. Skin: No jaundice, no rash, no sores Nodes: No cervical adenopathy Psych: Calm, pleasant, cooperative.  Fluid speech.  Intake/Output from previous day: No intake/output data recorded. Intake/Output this shift: No intake/output data recorded.  LAB RESULTS: Recent Labs    12/18/2020 1211  WBC 6.1  HGB 9.9*  HCT 32.7*  PLT 359   BMET Lab Results  Component Value Date   NA 136 12/01/2020   NA 136 06/23/2020   NA 135 06/22/2020   K 4.0 12/01/2020   K 3.9 06/23/2020   K 3.3 (L) 06/22/2020   CL 102 12/22/2020   CL 102 06/23/2020   CL 100 06/22/2020   CO2 20 (L) 12/14/2020   CO2 26 06/23/2020   CO2 25 06/22/2020   GLUCOSE 93 12/07/2020   GLUCOSE 93 06/23/2020   GLUCOSE 96 06/22/2020   BUN 21 12/21/2020   BUN 17 06/23/2020   BUN 22 06/22/2020   CREATININE 1.47 (H) 11/27/2020   CREATININE 1.16 06/23/2020   CREATININE 1.27 (H) 06/22/2020   CALCIUM 9.4 12/14/2020   CALCIUM 8.7 (L) 06/23/2020   CALCIUM 8.3 (L) 06/22/2020   LFT Recent Labs    11/29/20 1211  PROT 7.3  ALBUMIN 3.0*  AST 27  ALT 12  ALKPHOS 70  BILITOT 1.3*   PT/INR Lab Results  Component Value Date   INR 0.95 11/07/2018   Hepatitis Panel No results for input(s): HEPBSAG, HCVAB, HEPAIGM, HEPBIGM in the last 72 hours. C-Diff No components found for: CDIFF Lipase     Component Value Date/Time   LIPASE 107 (H)  Dec 13, 2020 1211    Drugs of Abuse     Component Value Date/Time   LABOPIA NONE DETECTED 04/17/2012 0549   COCAINSCRNUR POSITIVE (A) 04/17/2012 0549   LABBENZ NONE DETECTED 04/17/2012 0549   AMPHETMU NONE DETECTED 04/17/2012 0549   THCU NONE DETECTED 04/17/2012 0549   LABBARB NONE DETECTED 04/17/2012 0549     RADIOLOGY STUDIES: CT ABDOMEN PELVIS WO CONTRAST  Result Date: 12/13/2020 CLINICAL DATA:  Weight loss, abdominal distention. EXAM: CT ABDOMEN AND PELVIS WITHOUT CONTRAST TECHNIQUE: Multidetector CT imaging of the abdomen and  pelvis was performed following the standard protocol without IV contrast. COMPARISON:  None. FINDINGS: Lower chest: No acute abnormality. Hepatobiliary: Status post cholecystectomy. No biliary dilatation is noted. 18 mm rounded low density is noted in right hepatic lobe consistent with metastatic disease. Pancreas: 24 x 20 cm lobulated heterogeneous mass appears to be arising from the pancreatic body and tail highly concerning for malignancy. Low density areas are noted concerning for possible necrosis. No ductal dilatation is noted. Spleen: Normal in size without focal abnormality. Adrenals/Urinary Tract: Adrenal glands are unremarkable. Kidneys are normal, without renal calculi, focal lesion, or hydronephrosis. Bladder is unremarkable. Stomach/Bowel: The stomach is displaced anteriorly by the probable large pancreatic mass. There does not appear to be any definite evidence of bowel obstruction or ileus. The appendix appears normal. Vascular/Lymphatic: Aortic atherosclerosis. No enlarged abdominal or pelvic lymph nodes. Reproductive: Status post prostatic brachytherapy seed placement. Other: No abdominal wall hernia or abnormality. No abdominopelvic ascites. Musculoskeletal: No acute or significant osseous findings. IMPRESSION: 1. 24 x 20 cm lobulated heterogeneous mass appears to be arising from the pancreatic body and tail highly concerning for malignancy. Low density areas are noted concerning for possible necrosis. CT scan with intravenous contrast is recommended for further evaluation. 2. 18 mm rounded low density is noted in right hepatic lobe consistent with metastatic disease. 3. Status post prostatic brachytherapy seed placement. 4. Aortic atherosclerosis. Aortic Atherosclerosis (ICD10-I70.0). Electronically Signed   By: Lupita Raider M.D.   On: December 13, 2020 13:33     IMPRESSION:   *   Weight loss, anorexia, abdominal mass on physical exam.  Pancreatic mass with liver lesions suspicious for mets.  No  obstructive symptoms in terms of abdominal pain, nausea, vomiting.  *    Microcytic anemia, FOBT positive.  No overt bleeding i.e. no melena, blood per rectum, hematemesis/coffee ground emesis.  *    Pancreatic cancer treated with radiation seed implant in 2019.  *   Mild renal insufficiency, normal BUN/creatinine 1.4, GFR 52.    PLAN:     *   Set up for upper endoscopy tomorrow around 10 AM.  Patient is agreeable to proceed. Anemia/iron studies pending.  IR consulted regarding biopsy of liver vs pancreatic mass.  If IR is not successful or able to pursue this, would need EUS with FNA but that may take some days to arrange. Patient can have regular diet today but n.p.o. after midnight.   Jennye Moccasin  12/13/20, 3:45 PM Phone [phone]

## 2020-11-29 NOTE — ED Notes (Signed)
[place] sandwich and a cup of applesauce placed at bedside. Pt aware that he is NPO after midnight.

## 2020-11-29 NOTE — ED Notes (Signed)
Dinner Tray Ordered @ 1713. 

## 2020-11-29 NOTE — H&P (View-Only) (Signed)
[facility] Consult: 3:45 PM 12/14/2020  LOS: 0 days     Referring Provider: Dr Harvest Forest  Primary Care Physician:  Colon Branch, MD Primary Gastroenterologist:  none   Reason for Consultation:  Pancreatic, liver masses.  Anemia,  FOBT +   HPI: Edward Stafford is a 70 y.o. male.  See list below for past medical hx.  Hx dementia.  Prostate cancer, treated with radiation seed implant 10/2018.  2002 cholecystectomy. 09/2010 colonoscopy, average risk screening, Dr Sharlett Iles.  Unremarkable study to the IC valve.  Has not had upper endoscopy or a subsequent colonoscopy.  Seen at PCP office morning re: weight loss, poor p.o. intake.  For about a month he has had early satiety, anorexia and reports 60 pound weight loss.  82.7 kg in late July 2021, 85 kg today, 5 plus months later.   MD noted  abdominal mass on exam.  Referred to the ED and now being admitted to [facility] hospitalist service. CTAP w/o contrast: 24 x 20 mass arising from pancreatic body and tail concerning for malignancy w areas of low density concerning for necrosis.  Mass is displacing the stomach.  Contrasted CT recommended for further detailed eval.  18 mm density in right hepatic lobe consistent with mets.  Brachytherapy seed visible in prostate.  Aortic atherosclerosis Lipase 107.  LFTs normal.  Hgb 9.9, was 12 five months ago.  MCV low 78.  Social hx Retired from his work as a Education officer, community.  Rents an apartment/home.  Has a roommate but the roommate has to move out at the request of the landlady.  Patient sister lives nearby.  Smokes 2 cigarettes daily.  Drinks Shearon Stalls.  A pint lasts 2 weeks or more.  Denies illicit drug use: Marijuana, cocaine, opiates.  Family hx Did not know anything about his father or his father side of the family.  His  mother died of congestive heart failure.    Past Medical History:  Diagnosis Date  . Back pain, chronic    disabled due to chronic back pain  . Chest pain    Cardiac cath (-)2008, Myoview (-) 2011  . Chronic kidney disease   . Drug abuse-- crack, marijuana 04/19/2007   hx of  . GERD (gastroesophageal reflux disease)    no current meds  . Glaucoma   . Hyperglycemia   . Hyperlipidemia   . Hypertension   . Prostate cancer ([place]) 06/2018  . SINUS BRADYCARDIA 05/17/2010  . UTI (urinary tract infection)    on doxycycline for    Past Surgical History:  Procedure Laterality Date  . CHOLECYSTECTOMY    . CORONARY ANGIOPLASTY WITH STENT PLACEMENT    . PROSTATE BIOPSY    . RADIOACTIVE SEED IMPLANT N/A 11/14/2018   Procedure: RADIOACTIVE SEED IMPLANT/BRACHYTHERAPY IMPLANT;  Surgeon: Ardis Hughs, MD;  Location: [facility];  Service: Urology;  Laterality: N/A;  . SPACE OAR INSTILLATION N/A 11/14/2018   Procedure: SPACE OAR INSTILLATION;  Surgeon: Ardis Hughs, MD;  Location: [facility];  Service:  Urology;  Laterality: N/A;    Prior to Admission medications   Medication Sig Start Date End Date Taking? Authorizing Provider  aspirin EC 81 MG tablet Take 81 mg by mouth daily.    [provider]  feeding supplement, ENSURE ENLIVE, (ENSURE ENLIVE) LIQD Take 237 mLs by mouth 3 (three) times daily between meals. 06/23/20   Raiford Noble Latif, DO  losartan (COZAAR) 25 MG tablet Take 1 tablet (25 mg total) by mouth every evening. 05/24/20   Colon Branch, MD  Multiple Vitamin (MULTIVITAMIN WITH MINERALS) TABS tablet Take 1 tablet by mouth daily. 06/23/20   Raiford Noble Latif, DO  pravastatin (PRAVACHOL) 20 MG tablet Take 1 tablet (20 mg total) by mouth daily. 05/24/20   Colon Branch, MD  QUEtiapine (SEROQUEL) 25 MG tablet Take 1 tablet (25 mg total) by mouth at bedtime. 06/23/20   Raiford Noble Latif, DO  tamsulosin (FLOMAX) 0.4 MG CAPS capsule Take  0.4 mg by mouth daily. Patient not taking: Reported on 12/13/2020 12/08/19   [provider]    Scheduled Meds:  Infusions:  PRN Meds:    Allergies as of 12/12/2020  . (No Known Allergies)    Family History  Problem Relation Age of Onset  . CAD Mother        age onset ?  . Colon cancer Other        cousin  . Stroke Neg Hx   . Prostate cancer Neg Hx   . Diabetes Neg Hx   . Breast cancer Neg Hx   . Pancreatic cancer Neg Hx     Social History   Socioeconomic History  . Marital status: Single    Spouse name: Not on file  . Number of children: 0  . Years of education: Not on file  . Highest education level: Not on file  Occupational History  . Occupation: Disabled.   Tobacco Use  . Smoking status: Former Smoker    Packs/day: 0.50    Years: 15.00    Pack years: 7.50    Types: Cigarettes    Quit date: 06/15/2013    Years since quitting: 7.4  . Smokeless tobacco: Never Used  . Tobacco comment: never heavy smoker   Vaping Use  . Vaping Use: Never used  Substance and Sexual Activity  . Alcohol use: No    Comment: none  . Drug use: Not Currently    Comment: denies since ~ 2014, h/o marijuana-crack  . Sexual activity: Not Currently  Other Topics Concern  . Not on file  Social History Narrative   Lives w/ a room mate   has a car.    Has a sister?   Has a nephew  Dr Clotilde Dieter, radiologust, [place]   Has a brother in [place]   Social Determinants of Health   Financial Resource Strain: Low Risk   . Difficulty of Paying Living Expenses: Not hard at all  Food Insecurity: No Food Insecurity  . Worried About Charity fundraiser in the Last Year: Never true  . Ran Out of Food in the Last Year: Never true  Transportation Needs: No Transportation Needs  . Lack of Transportation (Medical): No  . Lack of Transportation (Non-Medical): No  Physical Activity: Not on file  Stress: Not on file  Social Connections: Not on file  Intimate Partner  Violence: Not on file    REVIEW OF SYSTEMS: Constitutional: Some fatigue, some weakness but nothing profound. ENT:  No nose bleeds  Pulm: No shortness of breath, no cough CV:  No palpitations, no LE edema.  No angina GU:  No hematuria, no frequency, no dark-colored urine.  No oliguria GI: See HPI.  No dysphagia. Heme: No unusual bleeding or bruising. Transfusions: No previous transfusion with blood products. Neuro:  No headaches, no peripheral tingling or numbness Derm:  No itching, no rash or sores.  Endocrine:  No sweats or chills.  No polyuria or dysuria Immunization: Reviewed.  Up-to-date on multiple vaccinations including Pfizer Covid 19, had the booster on 11/01/2020 Travel:  None beyond local counties in last few months.    PHYSICAL EXAM: Vital signs in last 24 hours: Vitals:   12/25/2020 1230 12/11/2020 1245  BP: (!) 148/101 (!) 163/82  Pulse: 70 60  Resp: 19 19  Temp:    SpO2: 100% 100%   Wt Readings from Last 3 Encounters:  12/05/2020 85 kg  12/03/2020 85 kg  06/19/20 (!) 102.7 kg    General: Pleasant, looks mildly ill but not toxic.  Comfortable. Head: No facial asymmetry or swelling.  No signs of head trauma. Eyes: No scleral icterus or conjunctival pallor.  EOMI Ears: Not hard of hearing Nose: No discharge Mouth: No teeth.  Mucosa is pink, moist, clear.  Tongue is midline. Neck: No masses, JVD, thyromegaly Lungs: Clear bilaterally without labored breathing or cough. Heart: RRR.  No MRG.  S1, S2 present Abdomen: Soft without tenderness.  Bowel sounds active.  No distention.  Distinct large mass vs splenomegaly occupying most of the left upper quadrant to at least the level of umbilicus.  No hernias, bruits..   Rectal: Deferred.  Rapidly FOBT positive per admitting MD, not clear if dark colored or brown. Musc/Skeltl: No joint redness, swelling or gross deformity. Extremities: No CCE. Neurologic: Oriented x3.  Does not seem confused to me.  Speech is fluid.  No  tremors, moves all 4 limbs.  Strength not tested. Skin: No jaundice, no rash, no sores Nodes: No cervical adenopathy Psych: Calm, pleasant, cooperative.  Fluid speech.  Intake/Output from previous day: No intake/output data recorded. Intake/Output this shift: No intake/output data recorded.  LAB RESULTS: Recent Labs    12/11/2020 1211  WBC 6.1  HGB 9.9*  HCT 32.7*  PLT 359   BMET Lab Results  Component Value Date   NA 136 12/17/2020   NA 136 06/23/2020   NA 135 06/22/2020   K 4.0 12/25/2020   K 3.9 06/23/2020   K 3.3 (L) 06/22/2020   CL 102 12/08/2020   CL 102 06/23/2020   CL 100 06/22/2020   CO2 20 (L) 12/24/2020   CO2 26 06/23/2020   CO2 25 06/22/2020   GLUCOSE 93 12/19/2020   GLUCOSE 93 06/23/2020   GLUCOSE 96 06/22/2020   BUN 21 12/26/2020   BUN 17 06/23/2020   BUN 22 06/22/2020   CREATININE 1.47 (H) 12/16/2020   CREATININE 1.16 06/23/2020   CREATININE 1.27 (H) 06/22/2020   CALCIUM 9.4 12/23/2020   CALCIUM 8.7 (L) 06/23/2020   CALCIUM 8.3 (L) 06/22/2020   LFT Recent Labs    12/12/2020 1211  PROT 7.3  ALBUMIN 3.0*  AST 27  ALT 12  ALKPHOS 70  BILITOT 1.3*   PT/INR Lab Results  Component Value Date   INR 0.95 11/07/2018   Hepatitis Panel No results for input(s): HEPBSAG, HCVAB, HEPAIGM, HEPBIGM in the last 72 hours. C-Diff No components found for: CDIFF Lipase     Component Value Date/Time   LIPASE 107 (H)  Dec 13, 2020 1211    Drugs of Abuse     Component Value Date/Time   LABOPIA NONE DETECTED 04/17/2012 0549   COCAINSCRNUR POSITIVE (A) 04/17/2012 0549   LABBENZ NONE DETECTED 04/17/2012 0549   AMPHETMU NONE DETECTED 04/17/2012 0549   THCU NONE DETECTED 04/17/2012 0549   LABBARB NONE DETECTED 04/17/2012 0549     RADIOLOGY STUDIES: CT ABDOMEN PELVIS WO CONTRAST  Result Date: 12/13/2020 CLINICAL DATA:  Weight loss, abdominal distention. EXAM: CT ABDOMEN AND PELVIS WITHOUT CONTRAST TECHNIQUE: Multidetector CT imaging of the abdomen and  pelvis was performed following the standard protocol without IV contrast. COMPARISON:  None. FINDINGS: Lower chest: No acute abnormality. Hepatobiliary: Status post cholecystectomy. No biliary dilatation is noted. 18 mm rounded low density is noted in right hepatic lobe consistent with metastatic disease. Pancreas: 24 x 20 cm lobulated heterogeneous mass appears to be arising from the pancreatic body and tail highly concerning for malignancy. Low density areas are noted concerning for possible necrosis. No ductal dilatation is noted. Spleen: Normal in size without focal abnormality. Adrenals/Urinary Tract: Adrenal glands are unremarkable. Kidneys are normal, without renal calculi, focal lesion, or hydronephrosis. Bladder is unremarkable. Stomach/Bowel: The stomach is displaced anteriorly by the probable large pancreatic mass. There does not appear to be any definite evidence of bowel obstruction or ileus. The appendix appears normal. Vascular/Lymphatic: Aortic atherosclerosis. No enlarged abdominal or pelvic lymph nodes. Reproductive: Status post prostatic brachytherapy seed placement. Other: No abdominal wall hernia or abnormality. No abdominopelvic ascites. Musculoskeletal: No acute or significant osseous findings. IMPRESSION: 1. 24 x 20 cm lobulated heterogeneous mass appears to be arising from the pancreatic body and tail highly concerning for malignancy. Low density areas are noted concerning for possible necrosis. CT scan with intravenous contrast is recommended for further evaluation. 2. 18 mm rounded low density is noted in right hepatic lobe consistent with metastatic disease. 3. Status post prostatic brachytherapy seed placement. 4. Aortic atherosclerosis. Aortic Atherosclerosis (ICD10-I70.0). Electronically Signed   By: Lupita Raider M.D.   On: December 13, 2020 13:33     IMPRESSION:   *   Weight loss, anorexia, abdominal mass on physical exam.  Pancreatic mass with liver lesions suspicious for mets.  No  obstructive symptoms in terms of abdominal pain, nausea, vomiting.  *    Microcytic anemia, FOBT positive.  No overt bleeding i.e. no melena, blood per rectum, hematemesis/coffee ground emesis.  *    Pancreatic cancer treated with radiation seed implant in 2019.  *   Mild renal insufficiency, normal BUN/creatinine 1.4, GFR 52.    PLAN:     *   Set up for upper endoscopy tomorrow around 10 AM.  Patient is agreeable to proceed. Anemia/iron studies pending.  IR consulted regarding biopsy of liver vs pancreatic mass.  If IR is not successful or able to pursue this, would need EUS with FNA but that may take some days to arrange. Patient can have regular diet today but n.p.o. after midnight.   Jennye Moccasin  12/13/20, 3:45 PM Phone [phone]

## 2020-11-29 NOTE — Progress Notes (Signed)
Subjective:    Patient ID: Edward Stafford, male    DOB: 20-Oct-1951, 70 y.o.   MRN: [id number]  DOS:  12-13-20 Type of visit - description: Acute  Since the last visit with me, was admitted to the hospital on 06/19/2020 and discharged 5 days later. He was noted to be a poor historian, his only family member nephew does not think he is able to self-care. Diagnosis during the admission 05-2020:  Recurrent fall Relative hypotension and tachycardia resolved, possibly related to dehydration, HCTZ discontinued. Acute kidney injury: Upon arrival 2.9, improving when discharge. Rhabdomyolysis with CKs trending down Hypokalemia Normocytic anemia, recommended to check anemia panel as an outpatient Suspected symptomatic B12 deficiency: B12 191, shuffling gait, peripheral neuropathy, cognitive impairment. Inability to take care of himself Undiagnosed dementia, atrophy noted on MRI scan. Neutropenia  They recommended outpatient neurology follow-up. Was discharged to SNF Subsequently he was discharged home.  He is here today at the request of his nephew. I noted  significant weight loss. The patient reports he does not have any appetite.  Denies any abdominal pain, nausea, vomiting, diarrhea. No fever or chills. I noticed he has  no teeth, I asked him if he has food available and he said yes.  Then more is specifically asked about breakfast today or dinner last night and he said he did not have anything to eat.    Wt Readings from Last 3 Encounters:  2020/12/13 187 lb 8 oz (85 kg)  06/19/20 (!) 226 lb 8 oz (102.7 kg)  05/20/20 247 lb 4 oz (112.2 kg)     Review of Systems See above   Past Medical History:  Diagnosis Date  . Back pain, chronic    disabled due to chronic back pain  . Chest pain    Cardiac cath (-)2008, Myoview (-) 2011  . Chronic kidney disease   . Drug abuse-- crack, marijuana 04/19/2007   hx of  . GERD (gastroesophageal reflux disease)    no current meds  .  Glaucoma   . Hyperglycemia   . Hyperlipidemia   . Hypertension   . Prostate cancer ([facility]) 06/2018  . SINUS BRADYCARDIA 05/17/2010  . UTI (urinary tract infection)    on doxycycline for    Past Surgical History:  Procedure Laterality Date  . CHOLECYSTECTOMY    . CORONARY ANGIOPLASTY WITH STENT PLACEMENT    . PROSTATE BIOPSY    . RADIOACTIVE SEED IMPLANT N/A 11/14/2018   Procedure: RADIOACTIVE SEED IMPLANT/BRACHYTHERAPY IMPLANT;  Surgeon: Crist Fat, MD;  Location: [facility];  Service: Urology;  Laterality: N/A;  . SPACE OAR INSTILLATION N/A 11/14/2018   Procedure: SPACE OAR INSTILLATION;  Surgeon: Crist Fat, MD;  Location: [facility];  Service: Urology;  Laterality: N/A;    Allergies as of 12/13/20   No Known Allergies     Medication List       Accurate as of 12-13-2020  9:23 AM. If you have any questions, ask your nurse or doctor.        aspirin EC 81 MG tablet Take 81 mg by mouth daily.   feeding supplement Liqd Take 237 mLs by mouth 3 (three) times daily between meals.   losartan 25 MG tablet Commonly known as: COZAAR Take 1 tablet (25 mg total) by mouth every evening.   multivitamin with minerals Tabs tablet Take 1 tablet by mouth daily.   pravastatin 20 MG tablet Commonly known as: PRAVACHOL Take 1 tablet (  20 mg total) by mouth daily.   QUEtiapine 25 MG tablet Commonly known as: SEROQUEL Take 1 tablet (25 mg total) by mouth at bedtime.   tamsulosin 0.4 MG Caps capsule Commonly known as: FLOMAX Take 0.4 mg by mouth daily.          Objective:   Physical Exam Abdominal:      BP (!) 135/91 (BP Location: Left Arm, Patient Position: Sitting, Cuff Size: Normal)   Pulse 97   Temp 97.7 F (36.5 C) (Oral)   Resp 18   Ht 6' (1.829 m)   Wt 187 lb 8 oz (85 kg)   SpO2 98%   BMI 25.43 kg/m  General:   Nontoxic, significant weight loss noted HEENT:  Normocephalic . Face symmetric,  atraumatic No thyromegaly Lymphatic system: No abnormal lymph nodes at the neck axillary areas or groin. Lungs:  CTA B Normal respiratory effort, no intercostal retractions, no accessory muscle use. Heart: RRR,  no murmur.  Abdomen:  Not distended, soft, has a firm mass at the left upper quadrant.  Not tender. Skin: Not pale. Not jaundice Lower extremities: no pretibial edema bilaterally  Neurologic:  alert & demented.  Not oriented to time, place, did not recall his phone number. Speech normal, gait shuffling, assisted by a cane. Psych--  No anxious or depressed appearing.     Assessment    Assessment   Prediabetes Neuropathy HTN Hyperlipidemia Bradycardia, chronic (not on  BB-CCBs, asx  ekg 11-2016: sinus brady)  CKD Glaucoma Prostate  ca: --Elevated PSA : 8.6  2, did not show for urology referral 3 as of 05-2016 -- 06/2018 (+) bx for cancer, seed implants 10/2018 Chronic back pain, disabled   H/o  Drug abuse, crack, marijuana  H/o CP Cath 2008 and Myoview 2011 negative  PLAN: Weight loss, dementia, abdominal mass, failure to thrive, apparently unsafe environment at home The patient presents with above problems. -Weight loss is significant, probably related to poor p.o. intake due to lack of food or inability to eat it ([place]).  He also has abdominal mass thus DDX includes a yet undiagnosed malignancy. -Has marked dementia, no much support around him. He arrived to the office driving but he told me "I should not be driving". -He needs further medical evaluation, consult a social worker and come up with a plan of care.  Several phone calls may including to his brother [name] but he could not offer much insight and he was at work so he did not offer help. I was able to get Dr. Gilford Raid at [facility] ER to accept the patient for further evaluation.  I really appreciate her help.   Time spent with the patient 45 minutes coordinating his care. This visit occurred during the  SARS-CoV-2 public health emergency.  Safety protocols were in place, including screening questions prior to the visit, additional usage of staff PPE, and extensive cleaning of exam room while observing appropriate contact time as indicated for disinfecting solutions.

## 2020-11-29 NOTE — Patient Instructions (Addendum)
Please go downstairs to the emergency room.

## 2020-11-29 NOTE — ED Notes (Signed)
Pt unable to give urine sample at this time. Provided urinal.

## 2020-11-29 NOTE — ED Provider Notes (Addendum)
[facility] EMERGENCY DEPARTMENT Provider Note   CSN: [id number] Arrival date & time: 12/18/2020  1146     History Chief Complaint  Patient presents with  . Weight Loss    Edward Stafford is a 70 y.o. male.  70 y.o male with a PMH of CKD, CAD, HTN, Dementia presents to the ED sent in by his primary care physician Dr. Laurine Blazer for failure to thrive.  According to Dr. Gordy Councilman note, patient currently cares for himself alone, has been unable to take care of himself, has lost 50 to 60 pounds in an unknown period of time.  Patient has also developed a abdominal mass, which the patient reports is new, taking up most of the abdominal cavity.  [name] note states brother was unable to be reached and Edward Stafford wanted patient admitted for failure to thrive.  She denies being in any pain at this time such as chest pain, abdominal pain, shortness of breath, other complaints. Level 5 caveat due to mental status.   The history is provided by the patient and medical records.       Past Medical History:  Diagnosis Date  . Back pain, chronic    disabled due to chronic back pain  . Chest pain    Cardiac cath (-)2008, Myoview (-) 2011  . Chronic kidney disease   . Drug abuse-- crack, marijuana 04/19/2007   hx of  . GERD (gastroesophageal reflux disease)    no current meds  . Glaucoma   . Hyperglycemia   . Hyperlipidemia   . Hypertension   . Prostate cancer ([place]) 06/2018  . SINUS BRADYCARDIA 05/17/2010  . UTI (urinary tract infection)    on doxycycline for    Patient Active Problem List   Diagnosis Date Noted  . Dementia ([place]) 12/02/2020  . Pancreatic mass 12/11/2020  . Malnutrition of moderate degree 06/21/2020  . Neutropenia ([place]) 06/21/2020  . Hypotension 06/20/2020  . B12 deficiency 06/20/2020  . Tachycardia 06/20/2020  . Rhabdomyolysis 06/20/2020  . Hypokalemia 06/20/2020  . Peripheral neuropathy 06/20/2020  . Shuffling gait 06/20/2020  . BPH (benign prostatic  hyperplasia) 06/20/2020  . High anion gap metabolic acidosis 40/98/1191  . Recurrent falls 06/20/2020  . Acute kidney injury superimposed on CKD ([place]) 06/19/2020  . Fall at home, initial encounter 06/19/2020  . Prostate cancer ([place]) 08/01/2018  . Elevated PSA 05/13/2018  . PCP NOTES >>> 09/01/2015  . Annual physical exam 07/15/2014  . BMI 38.0-38.9,adult 04/16/2014  . CKD (chronic kidney disease), stage III ([place]) 04/17/2012  . Dizziness 04/16/2012  . CORONARY ARTERY DISEASE ? 05/17/2010  . SINUS BRADYCARDIA 05/17/2010  . GERD 01/19/2010  . Hyperglycemia 06/08/2008  . Glaucoma 12/19/2007  . HYPERLIPIDEMIA, MIXED 04/19/2007  . Drug abuse-- crack, marijuana 04/19/2007  . HTN (hypertension) 04/19/2007    Past Surgical History:  Procedure Laterality Date  . CHOLECYSTECTOMY    . CORONARY ANGIOPLASTY WITH STENT PLACEMENT    . PROSTATE BIOPSY    . RADIOACTIVE SEED IMPLANT N/A 11/14/2018   Procedure: RADIOACTIVE SEED IMPLANT/BRACHYTHERAPY IMPLANT;  Surgeon: Ardis Hughs, MD;  Location: [facility];  Service: Urology;  Laterality: N/A;  . SPACE OAR INSTILLATION N/A 11/14/2018   Procedure: SPACE OAR INSTILLATION;  Surgeon: Ardis Hughs, MD;  Location: [facility];  Service: Urology;  Laterality: N/A;       Family History  Problem Relation Age of Onset  . CAD Mother        age  onset ?  . Colon cancer Other        cousin  . Stroke Neg Hx   . Prostate cancer Neg Hx   . Diabetes Neg Hx   . Breast cancer Neg Hx   . Pancreatic cancer Neg Hx     Social History   Tobacco Use  . Smoking status: Former Smoker    Packs/day: 0.50    Years: 15.00    Pack years: 7.50    Types: Cigarettes    Quit date: 06/15/2013    Years since quitting: 7.4  . Smokeless tobacco: Never Used  . Tobacco comment: never heavy smoker   Vaping Use  . Vaping Use: Never used  Substance Use Topics  . Alcohol use: No    Comment: none  . Drug use: Not Currently     Comment: denies since ~ 2014, h/o marijuana-crack    Home Medications Prior to Admission medications   Medication Sig Start Date End Date Taking? Authorizing Provider  aspirin EC 81 MG tablet Take 81 mg by mouth daily.    [provider]  feeding supplement, ENSURE ENLIVE, (ENSURE ENLIVE) LIQD Take 237 mLs by mouth 3 (three) times daily between meals. 06/23/20   Raiford Noble Latif, DO  losartan (COZAAR) 25 MG tablet Take 1 tablet (25 mg total) by mouth every evening. 05/24/20   Colon Branch, MD  Multiple Vitamin (MULTIVITAMIN WITH MINERALS) TABS tablet Take 1 tablet by mouth daily. 06/23/20   Raiford Noble Latif, DO  pravastatin (PRAVACHOL) 20 MG tablet Take 1 tablet (20 mg total) by mouth daily. 05/24/20   Colon Branch, MD  QUEtiapine (SEROQUEL) 25 MG tablet Take 1 tablet (25 mg total) by mouth at bedtime. 06/23/20   Raiford Noble Latif, DO  tamsulosin (FLOMAX) 0.4 MG CAPS capsule Take 0.4 mg by mouth daily. Patient not taking: Reported on 12/25/2020 12/08/19   [provider]    Allergies    Patient has no known allergies.  Review of Systems   Review of Systems  HENT: Negative for sore throat.   Respiratory: Negative for shortness of breath.   Cardiovascular: Negative for chest pain.  Gastrointestinal: Negative for abdominal pain.  Genitourinary: Negative for flank pain.  Musculoskeletal: Negative for back pain.  Skin: Negative for pallor and wound.  Neurological: Negative for light-headedness and headaches.  All other systems reviewed and are negative.   Physical Exam Updated Vital Signs BP (!) 163/82   Pulse 60   Temp 98.1 F (36.7 C) (Oral)   Resp 19   Ht 6' (1.829 m)   Wt 85 kg   SpO2 100%   BMI 25.41 kg/m   Physical Exam Vitals and nursing note reviewed.  Constitutional:      Appearance: Normal appearance.  HENT:     Head: Normocephalic and atraumatic.     Mouth/Throat:     Mouth: Mucous membranes are dry.  Eyes:     Pupils: Pupils are equal,  round, and reactive to light.  Cardiovascular:     Rate and Rhythm: Normal rate.  Pulmonary:     Effort: Pulmonary effort is normal.     Breath sounds: No wheezing or rales.  Abdominal:     General: Bowel sounds are decreased.     Palpations: There is splenomegaly and mass. There is no shifting dullness, fluid wave, hepatomegaly or pulsatile mass.     Tenderness: There is no abdominal tenderness. There is no right CVA tenderness, left CVA tenderness, guarding  or rebound. Negative signs include McBurney's sign.     Hernia: No hernia is present.    Musculoskeletal:     Cervical back: Normal range of motion and neck supple.     Right lower leg: No edema.     Left lower leg: No edema.  Skin:    General: Skin is warm and dry.  Neurological:     Mental Status: Edward Stafford is alert. Mental status is at baseline.     Comments:  AO x 3     ED Results / Procedures / Treatments   Labs (all labs ordered are listed, but only abnormal results are displayed) Labs Reviewed  CBC WITH DIFFERENTIAL/PLATELET - Abnormal; Notable for the following components:      Result Value   RBC 4.17 (*)    Hemoglobin 9.9 (*)    HCT 32.7 (*)    MCV 78.4 (*)    MCH 23.7 (*)    RDW 17.6 (*)    All other components within normal limits  COMPREHENSIVE METABOLIC PANEL - Abnormal; Notable for the following components:   CO2 20 (*)    Creatinine, Ser 1.47 (*)    Albumin 3.0 (*)    Total Bilirubin 1.3 (*)    GFR, Estimated 52 (*)    All other components within normal limits  LIPASE, BLOOD - Abnormal; Notable for the following components:   Lipase 107 (*)    All other components within normal limits  URINE CULTURE  SARS CORONAVIRUS 2 (TAT 6-24 HRS)  URINALYSIS, ROUTINE W REFLEX MICROSCOPIC  OCCULT BLOOD X 1 CARD TO LAB, STOOL    EKG None  Radiology CT ABDOMEN PELVIS WO CONTRAST  Result Date: 12/15/2020 CLINICAL DATA:  Weight loss, abdominal distention. EXAM: CT ABDOMEN AND PELVIS WITHOUT CONTRAST TECHNIQUE:  Multidetector CT imaging of the abdomen and pelvis was performed following the standard protocol without IV contrast. COMPARISON:  None. FINDINGS: Lower chest: No acute abnormality. Hepatobiliary: Status post cholecystectomy. No biliary dilatation is noted. 18 mm rounded low density is noted in right hepatic lobe consistent with metastatic disease. Pancreas: 24 x 20 cm lobulated heterogeneous mass appears to be arising from the pancreatic body and tail highly concerning for malignancy. Low density areas are noted concerning for possible necrosis. No ductal dilatation is noted. Spleen: Normal in size without focal abnormality. Adrenals/Urinary Tract: Adrenal glands are unremarkable. Kidneys are normal, without renal calculi, focal lesion, or hydronephrosis. Bladder is unremarkable. Stomach/Bowel: The stomach is displaced anteriorly by the probable large pancreatic mass. There does not appear to be any definite evidence of bowel obstruction or ileus. The appendix appears normal. Vascular/Lymphatic: Aortic atherosclerosis. No enlarged abdominal or pelvic lymph nodes. Reproductive: Status post prostatic brachytherapy seed placement. Other: No abdominal wall hernia or abnormality. No abdominopelvic ascites. Musculoskeletal: No acute or significant osseous findings. IMPRESSION: 1. 24 x 20 cm lobulated heterogeneous mass appears to be arising from the pancreatic body and tail highly concerning for malignancy. Low density areas are noted concerning for possible necrosis. CT scan with intravenous contrast is recommended for further evaluation. 2. 18 mm rounded low density is noted in right hepatic lobe consistent with metastatic disease. 3. Status post prostatic brachytherapy seed placement. 4. Aortic atherosclerosis. Aortic Atherosclerosis (ICD10-I70.0). Electronically Signed   By: Marijo Conception M.D.   On: 12/16/2020 13:33    Procedures Procedures (including critical care time)  Medications Ordered in  ED Medications - No data to display  ED Course  I have reviewed the triage  vital signs and the nursing notes.  Pertinent labs & imaging results that were available during my care of the patient were reviewed by me and considered in my medical decision making (see chart for details).  Clinical Course as of 12/10/2020 1534  Tue Nov 29, 2020  1412 Lipase(!): 107 [JS]    Clinical Course User Index [JS] Janeece Fitting, PA-C   MDM Rules/Calculators/A&P   Patient with a past medical history of CKD 3, hypertension, dementia presents to the ED sent in by PCP [name] who saw him at [facility] office today and sent in for further admission.  According to Dr. Anda Kraft note patient has had a weight loss of 50 to 60 pounds in an unknown period of time.  Edward Stafford also has developed an abdominal mass that was not present on the prior 74-monthvisit.  Note for review reveals a yet undiagnosed malignancy.  According to records, patient arrived to his doctor's appointment today driving but stated to Dr. GGeoffery Lyons"I should not be driving ".  Edward Stafford also attempted to contact his brother who was unable to be reached.  During primary evaluation vitals are within normal limits, no signs of respiratory distress, Edward Stafford is normotensive with a heart rate of 69.  During physical exam there is a significant large mass to the left abdominal cavity, indurated and nature.  No fluid wave present, no pulsatile mass noted.  Bowel sounds are diminished to auscultation in the left upper and lower quadrant.Reports normal bowel movements.However, history of unclear due to mental status. Lungs are clear to auscultation, rest of his exam is unremarkable.No URI symptoms.   Interpretation of his labs by me revealed a CBC with a slight decrease in his hemoglobin, Edward Stafford denies any blood in his stool, hematemesis or any other source of bleeding.  CMP without any electrolyte abnormality.  Creatinine function is slightly increased at 1.47 on today's visit does have  a prior history of renal insufficiency.  LFTs are within normal limits today, alk phos without any changes.  Lipase level is increased, suspect noncardiac malignancy due to as pressing on his abdomen will obtain CT abdomen at this time.  CT Abdomen/pelvis:  1. 24 x 20 cm lobulated heterogeneous mass appears to be arising  from the pancreatic body and tail highly concerning for malignancy.  Low density areas are noted concerning for possible necrosis. CT  scan with intravenous contrast is recommended for further  evaluation.  2. 18 mm rounded low density is noted in right hepatic lobe  consistent with metastatic disease.  3. Status post prostatic brachytherapy seed placement.  4. Aortic atherosclerosis.    Aortic Atherosclerosis (ICD10-I70.0).     Call placed to hospitalist service for further admission.  COVID-19 testing has been ordered.  Patient remains hemodynamically stable.  Spoke to Dr. STamala Julianwho will admit patient for further management. Patient is hemodynamically stable.    3:34 PM Hemoccult obtained with chaperone all the NT at the bedside.  Grossly positive.  Kalijah L CCroghanwas evaluated in Emergency Department on 12/18/2020 for the symptoms described in the history of present illness. Edward Stafford was evaluated in the context of the global COVID-19 pandemic, which necessitated consideration that the patient might be at risk for infection with the SARS-CoV-2 virus that causes COVID-19. Institutional protocols and algorithms that pertain to the evaluation of patients at risk for COVID-19 are in a state of rapid change based on information released by regulatory bodies including the CDC and federal and state organizations.  These policies and algorithms were followed during the patient's care in the ED.  Portions of this note were generated with [employer]. Dictation errors may occur despite best attempts at proofreading.  Final Clinical Impression(s) / ED  Diagnoses Final diagnoses:  Failure to thrive in adult  Pancreatic mass    Rx / DC Orders ED Discharge Orders    None       Janeece Fitting, PA-C 11/28/2020 [place], Marena Witts, PA-C 12/06/2020 1534    Daleen Bo, MD 05-Dec-2020 [phone]

## 2020-11-29 NOTE — Anesthesia Preprocedure Evaluation (Addendum)
Anesthesia Evaluation  Patient identified by MRN, date of birth, ID band Patient awake    Reviewed: Allergy & Precautions, NPO status , Patient's Chart, lab work & pertinent test results  History of Anesthesia Complications Negative for: history of anesthetic complications  Airway Mallampati: I  TM Distance: >3 FB Neck ROM: Full    Dental  (+) Edentulous Upper, Edentulous Lower   Pulmonary former smoker,    Pulmonary exam normal breath sounds clear to auscultation       Cardiovascular hypertension, Pt. on medications + CAD  Normal cardiovascular exam Rhythm:Regular Rate:Normal     Neuro/Psych PSYCHIATRIC DISORDERS Dementia negative neurological ROS     GI/Hepatic GERD  , Liver mets   Pancreatic mass    Endo/Other  negative endocrine ROS  Renal/GU negative Renal ROS    Prostate cancer  negative genitourinary   Musculoskeletal negative musculoskeletal ROS (+)  Chronic back pain    Abdominal   Peds negative pediatric ROS (+)  Hematology  (+) anemia ,   Anesthesia Other Findings   Reproductive/Obstetrics negative OB ROS                            Anesthesia Physical Anesthesia Plan  ASA: III  Anesthesia Plan: MAC   Post-op Pain Management:    Induction: Intravenous  PONV Risk Score and Plan: 1 and Propofol infusion and Treatment may vary due to age or medical condition  Airway Management Planned: Nasal Cannula and Natural Airway  Additional Equipment: None  Intra-op Plan:   Post-operative Plan:   Informed Consent: I have reviewed the patients History and Physical, chart, labs and discussed the procedure including the risks, benefits and alternatives for the proposed anesthesia with the patient or authorized representative who has indicated his/her understanding and acceptance.       Plan Discussed with: CRNA and Anesthesiologist  Anesthesia Plan Comments:         Anesthesia Quick Evaluation

## 2020-11-29 NOTE — ED Notes (Signed)
Call sister Rollene Fare at [phone] with any update

## 2020-11-29 NOTE — Assessment & Plan Note (Signed)
Weight loss, dementia, abdominal mass, failure to thrive, apparently unsafe environment at home The patient presents with above problems. -Weight loss is significant, probably related to poor p.o. intake due to lack of food or inability to eat it (edentoulos).  He also has abdominal mass thus DDX includes a yet undiagnosed malignancy. -Has marked dementia, no much support around him. He arrived to the office driving but he told me "I should not be driving". -He needs further medical evaluation, consult a social worker and come up with a plan of care.  Several phone calls may including to his brother Iantha Fallen but he could not offer much insight and he was at work so he did not offer help. I was able to get Dr. Particia Nearing at [facility] ER to accept the patient for further evaluation.  I really appreciate her help.

## 2020-11-30 ENCOUNTER — Inpatient Hospital Stay (HOSPITAL_COMMUNITY): Payer: Medicare Other | Admitting: Anesthesiology

## 2020-11-30 ENCOUNTER — Inpatient Hospital Stay (HOSPITAL_COMMUNITY): Payer: Medicare Other

## 2020-11-30 ENCOUNTER — Encounter (HOSPITAL_COMMUNITY): Payer: Self-pay | Admitting: Internal Medicine

## 2020-11-30 ENCOUNTER — Inpatient Hospital Stay (HOSPITAL_COMMUNITY): Payer: Medicare Other | Admitting: Certified Registered"

## 2020-11-30 ENCOUNTER — Other Ambulatory Visit: Payer: Self-pay

## 2020-11-30 ENCOUNTER — Encounter (HOSPITAL_COMMUNITY): Admission: EM | Disposition: E | Payer: Self-pay | Source: Home / Self Care | Attending: Internal Medicine

## 2020-11-30 ENCOUNTER — Other Ambulatory Visit: Payer: Self-pay | Admitting: Physician Assistant

## 2020-11-30 DIAGNOSIS — I469 Cardiac arrest, cause unspecified: Secondary | ICD-10-CM | POA: Diagnosis not present

## 2020-11-30 DIAGNOSIS — K259 Gastric ulcer, unspecified as acute or chronic, without hemorrhage or perforation: Secondary | ICD-10-CM

## 2020-11-30 DIAGNOSIS — D490 Neoplasm of unspecified behavior of digestive system: Secondary | ICD-10-CM

## 2020-11-30 DIAGNOSIS — K8689 Other specified diseases of pancreas: Secondary | ICD-10-CM | POA: Diagnosis not present

## 2020-11-30 DIAGNOSIS — K2100 Gastro-esophageal reflux disease with esophagitis, without bleeding: Secondary | ICD-10-CM | POA: Diagnosis not present

## 2020-11-30 DIAGNOSIS — K269 Duodenal ulcer, unspecified as acute or chronic, without hemorrhage or perforation: Secondary | ICD-10-CM

## 2020-11-30 DIAGNOSIS — N179 Acute kidney failure, unspecified: Secondary | ICD-10-CM | POA: Diagnosis not present

## 2020-11-30 DIAGNOSIS — R627 Adult failure to thrive: Secondary | ICD-10-CM | POA: Diagnosis not present

## 2020-11-30 HISTORY — PX: BIOPSY: SHX5522

## 2020-11-30 HISTORY — PX: ESOPHAGOGASTRODUODENOSCOPY (EGD) WITH PROPOFOL: SHX5813

## 2020-11-30 LAB — COMPREHENSIVE METABOLIC PANEL
ALT: 10 U/L (ref 0–44)
AST: 20 U/L (ref 15–41)
Albumin: 2.6 g/dL — ABNORMAL LOW (ref 3.5–5.0)
Alkaline Phosphatase: 67 U/L (ref 38–126)
Anion gap: 11 (ref 5–15)
BUN: 20 mg/dL (ref 8–23)
CO2: 21 mmol/L — ABNORMAL LOW (ref 22–32)
Calcium: 8.7 mg/dL — ABNORMAL LOW (ref 8.9–10.3)
Chloride: 103 mmol/L (ref 98–111)
Creatinine, Ser: 1.3 mg/dL — ABNORMAL HIGH (ref 0.61–1.24)
GFR, Estimated: 60 mL/min — ABNORMAL LOW (ref >60)
Glucose, Bld: 104 mg/dL — ABNORMAL HIGH (ref 70–99)
Potassium: 3.5 mmol/L (ref 3.5–5.1)
Sodium: 135 mmol/L (ref 135–145)
Total Bilirubin: 1.4 mg/dL — ABNORMAL HIGH (ref 0.3–1.2)
Total Protein: 6.3 g/dL — ABNORMAL LOW (ref 6.5–8.1)

## 2020-11-30 LAB — CBC
HCT: 25.9 % — ABNORMAL LOW (ref 39.0–52.0)
HCT: 28.9 % — ABNORMAL LOW (ref 39.0–52.0)
Hemoglobin: 8.1 g/dL — ABNORMAL LOW (ref 13.0–17.0)
Hemoglobin: 8.9 g/dL — ABNORMAL LOW (ref 13.0–17.0)
MCH: 24 pg — ABNORMAL LOW (ref 26.0–34.0)
MCH: 24.1 pg — ABNORMAL LOW (ref 26.0–34.0)
MCHC: 31.3 g/dL (ref 30.0–36.0)
MCHC: 30.8 g/dL (ref 30.0–36.0)
MCV: 76.6 fL — ABNORMAL LOW (ref 80.0–100.0)
MCV: 78.3 fL — ABNORMAL LOW (ref 80.0–100.0)
Platelets: 288 10*3/uL (ref 150–400)
Platelets: 314 10*3/uL (ref 150–400)
RBC: 3.38 MIL/uL — ABNORMAL LOW (ref 4.22–5.81)
RBC: 3.69 MIL/uL — ABNORMAL LOW (ref 4.22–5.81)
RDW: 17.6 % — ABNORMAL HIGH (ref 11.5–15.5)
RDW: 18 % — ABNORMAL HIGH (ref 11.5–15.5)
WBC: 5.7 10*3/uL (ref 4.0–10.5)
WBC: 8.4 10*3/uL (ref 4.0–10.5)
nRBC: 0 % (ref 0.0–0.2)
nRBC: 0 % (ref 0.0–0.2)

## 2020-11-30 LAB — GLUCOSE, CAPILLARY: Glucose-Capillary: 116 mg/dL — ABNORMAL HIGH (ref 70–99)

## 2020-11-30 SURGERY — ESOPHAGOGASTRODUODENOSCOPY (EGD) WITH PROPOFOL
Anesthesia: Monitor Anesthesia Care

## 2020-11-30 MED ORDER — PROPOFOL 10 MG/ML IV BOLUS
INTRAVENOUS | Status: DC | PRN
  Administered 2020-11-30: 20 mg via INTRAVENOUS

## 2020-11-30 MED ORDER — PANTOPRAZOLE SODIUM 40 MG IV SOLR
40.0000 mg | Freq: Two times a day (BID) | INTRAVENOUS | Status: DC
  Administered 2020-11-30: 40 mg via INTRAVENOUS
  Filled 2020-11-30: qty 40

## 2020-11-30 MED ORDER — PANTOPRAZOLE SODIUM 40 MG PO TBEC: 40.0000 mg | DELAYED_RELEASE_TABLET | Freq: Two times a day (BID) | ORAL | Status: DC

## 2020-11-30 MED ORDER — ETOMIDATE 2 MG/ML IV SOLN
INTRAVENOUS | Status: DC | PRN
  Administered 2020-11-30: 8 mg via INTRAVENOUS

## 2020-11-30 MED ORDER — PROPOFOL 500 MG/50ML IV EMUL
INTRAVENOUS | Status: DC | PRN
  Administered 2020-11-30: 150 ug/kg/min via INTRAVENOUS

## 2020-11-30 MED ORDER — SODIUM CHLORIDE 0.9 % IV SOLN: INTRAVENOUS | Status: DC

## 2020-11-30 MED ORDER — PHENYLEPHRINE 40 MCG/ML (10ML) SYRINGE FOR IV PUSH (FOR BLOOD PRESSURE SUPPORT)
PREFILLED_SYRINGE | INTRAVENOUS | Status: DC | PRN
  Administered 2020-11-30: 160 ug via INTRAVENOUS

## 2020-11-30 MED ORDER — SODIUM CHLORIDE 0.9 % IV SOLN
25.0000 mg | Freq: Once | INTRAVENOUS | Status: DC
  Filled 2020-11-30: qty 0.5

## 2020-11-30 MED ORDER — LIDOCAINE 2% (20 MG/ML) 5 ML SYRINGE
INTRAMUSCULAR | Status: DC | PRN
  Administered 2020-11-30: 80 mg via INTRAVENOUS

## 2020-11-30 MED ORDER — SODIUM CHLORIDE 0.9 % IV SOLN
1000.0000 mg | Freq: Once | INTRAVENOUS | Status: DC
  Filled 2020-11-30: qty 20

## 2020-11-30 MED ORDER — SUCCINYLCHOLINE CHLORIDE 20 MG/ML IJ SOLN
INTRAMUSCULAR | Status: DC | PRN
  Administered 2020-11-30: 140 mg via INTRAVENOUS

## 2020-11-30 SURGICAL SUPPLY — 15 items

## 2020-12-02 LAB — SURGICAL PATHOLOGY

## 2020-12-06 MED FILL — Medication: Qty: 1 | Status: AC

## 2020-12-27 NOTE — Procedures (Signed)
Cardiopulmonary Resuscitation Note  Rhett FARON TUDISCO  [id number]  11-Jan-1951  Date:12/03/2020  Time:2:27 PM   Provider Performing:Jabe Jeanbaptiste   Procedure: Cardiopulmonary Resuscitation [phone])  Indication(s) Loss of Pulse  Consent N/A  Anesthesia N/A   Time Out N/A   Sterile Technique Hand hygiene, gloves   Procedure Description Called to patient's room for CODE BLUE. Initial rhythm was PEA/Asystole. Patient received high quality chest compressions for 10 minutes with defibrillation or cardioversion when appropriate. Epinephrine was administered every 3 minutes as directed by time Biomedical engineer. Additional pharmacologic interventions included sodium bicarbonate. Additional procedural interventions include intubation.  Return of spontaneous circulation was achieved but patient lost pulse again several time after achieving ROSC and he was declared dead at 02:03 PM  Family called and notified.   Complications/Tolerance N/A   EBL N/A   Specimen(s) N/A

## 2020-12-27 NOTE — Death Summary Note (Signed)
DEATH SUMMARY   Patient Details  Name: Edward Stafford MRN: [id number] DOB: 18-Dec-1950  Admission/Discharge Information   Admit Date:  2020-12-03  Date of Death: Date of Death: 12-04-2020  Time of Death: Time of Death: March 08, 1402  Length of Stay: 1  Referring Physician: Colon Branch, MD   Reason(s) for Hospitalization    Diagnoses  Preliminary cause of death: PEA cardiac arrest due to suspected metastatic pancreatic cancer (and possible related complications). Secondary Diagnoses (including complications and co-morbidities):  Principal Problem:   Pancreatic mass Active Problems:   AKI (acute kidney injury) ([place])   Failure to thrive in adult   GI bleed   Acute blood loss anemia   Hyperbilirubinemia   Liver metastasis (HCC)   Hypoalbuminemia   Gastric ulcer   Duodenal ulcer   Brief Hospital Course (including significant findings, care, treatment, and services provided and events leading to death)  Edward Stafford is a 70 y.o. year old male who was events of most recent hospitalization that culminated in his demise were outlined as below:   70 year old male with history of hypertension, hyperlipidemia, CKD stage IIIa, GERD, prostate cancer s/p radioactive seed placement, hospitalized in July 2021 following a fall, confusion and shuffling gait at which time he was diagnosed with symptomatic B12 deficiency which improved after B12 shots, significant family history of pancreatic cancer in multiple cousins per report, presented to the ED with several weeks history of profound weight loss (50-60 pounds), reduced appetite, nausea with dry heaves.  Imaging revealed large pancreatic mass with suspected liver metastasis.  GI was consulted and s/p EGD 1/5 which shows tumor eroding through stomach, biopsied.  Oncology had been consulted.  A few hours after his EGD, patient went into a PEA arrest.  The code team attempted to resuscitate him without success.  Assessment & Plan:   Large  pancreatic mass, suspected pancreatic cancer with liver mets:  CT abdomen and pelvis without contrast 1/4: 24 x 20 cm lobulated heterogeneous mass appears to be arising from the pancreatic body and tail, highly concerning for malignancy.  Low-density areas are noted concerning for possible necrosis.  CT with IV contrast recommended.  18 mm rounded low-density right hepatic lobe consistent with metastatic disease.  [facility] consulted, s/p EGD 1/5 which shows tumor eroding into the stomach and was biopsied, pathology is pending  Dr. Hilarie Fredrickson, GI updated patient's sister and patient's nephew who is a radiologist in [place] on 2022/12/04 with radiographic and endoscopic findings and they were made aware that this is likely metastatic malignancy  Unfortunately shortly thereafter patient sustained a PEA cardiac arrest and failed resuscitation attempts.  I personally updated patient's sister and patient's nephew while resuscitation was in progress regarding his poor prognosis and then updated sister after patient's demise.  Strong family history of pancreatic cancer.  Dr. Hilarie Fredrickson discussed in detail with family after patient's demise regarding following up with their family physician for pancreatic cancer screening options for the family.  Anemia secondary to slow GI blood loss from pancreatic cancer eroding into the stomach/iron deficiency anemia:  Hemoglobin 12 on 7/29.    Presented with hemoglobin of 9.9 which is dropped to 8.1 in the absence of overt bleeding, possibly due to hemodilution from IV fluids.  Anemia panel: Iron 17, TIBC 323, saturation ratio 5, ferritin 35 and B12: 633.  FOBT +.  Aspirin held, PPI continued and plan was to give IV iron  Acute kidney injury complicating stage III a CKD  Presented with creatinine  of 1.47 which has improved to 1.3.  AKI seems to have resolved.  His creatinine seems to be at baseline.  Losartan was held  Essential hypertension  Reasonably  controlled.  Losartan was held  Hyperlipidemia  Suspect severe malnutrition/failure to thrive  Secondary to suspected metastatic pancreatic cancer.  Dementia  Without behavioral abnormalities  Appeared to have capacity to make medical decisions for himself.  History of prostate cancer s/p radiation seed placement 10/2018.  History of polysubstance abuse  Prior history of cocaine use back in 2013.   History of B12 deficiency  B12 normal now.  Body mass index is 25.41 kg/m.     Consultants:   [facility]  Procedures:   EGD 12/25/2020:  Impression:  LA Grade B reflux esophagitis with no bleeding. - A small amount of food (residue) in the stomach. - Likely malignant ulceration from tumor erosion in the gastric body. Biopsied. - Non-bleeding duodenal bulb ulcer with no stigmata of bleeding. Biopsied. - Normal second portion of the duodenum.  Recommendation:  - Return patient to hospital ward for ongoing care. - Advance diet as tolerated (expect need for gastroparesis diet). - Continue present medications including BID PPI. - Await pathology results. - No NSAIDs or ASA. - Oncology consult. - If gastric biopsies are nondiagnostic then IR consult is recommended for consideration of percutaneous CT guided biopsy. - Monitor Hgb, replace iron IV while here.    Pertinent Labs and Studies  Significant Diagnostic Studies CT ABDOMEN PELVIS WO CONTRAST  Result Date: 11-Dec-2020 CLINICAL DATA:  Weight loss, abdominal distention. EXAM: CT ABDOMEN AND PELVIS WITHOUT CONTRAST TECHNIQUE: Multidetector CT imaging of the abdomen and pelvis was performed following the standard protocol without IV contrast. COMPARISON:  None. FINDINGS: Lower chest: No acute abnormality. Hepatobiliary: Status post cholecystectomy. No biliary dilatation is noted. 18 mm rounded low density is noted in right hepatic lobe consistent with metastatic disease. Pancreas: 24 x  20 cm lobulated heterogeneous mass appears to be arising from the pancreatic body and tail highly concerning for malignancy. Low density areas are noted concerning for possible necrosis. No ductal dilatation is noted. Spleen: Normal in size without focal abnormality. Adrenals/Urinary Tract: Adrenal glands are unremarkable. Kidneys are normal, without renal calculi, focal lesion, or hydronephrosis. Bladder is unremarkable. Stomach/Bowel: The stomach is displaced anteriorly by the probable large pancreatic mass. There does not appear to be any definite evidence of bowel obstruction or ileus. The appendix appears normal. Vascular/Lymphatic: Aortic atherosclerosis. No enlarged abdominal or pelvic lymph nodes. Reproductive: Status post prostatic brachytherapy seed placement. Other: No abdominal wall hernia or abnormality. No abdominopelvic ascites. Musculoskeletal: No acute or significant osseous findings. IMPRESSION: 1. 24 x 20 cm lobulated heterogeneous mass appears to be arising from the pancreatic body and tail highly concerning for malignancy. Low density areas are noted concerning for possible necrosis. CT scan with intravenous contrast is recommended for further evaluation. 2. 18 mm rounded low density is noted in right hepatic lobe consistent with metastatic disease. 3. Status post prostatic brachytherapy seed placement. 4. Aortic atherosclerosis. Aortic Atherosclerosis (ICD10-I70.0). Electronically Signed   By: Lupita Raider M.D.   On: 2020-12-11 13:33    Microbiology Recent Results (from the past 240 hour(s))  SARS CORONAVIRUS 2 (TAT 6-24 HRS) Nasopharyngeal Nasopharyngeal Swab     Status: None   Collection Time: Dec 11, 2020  3:49 PM   Specimen: Nasopharyngeal Swab  Result Value Ref Range Status   SARS Coronavirus 2 NEGATIVE NEGATIVE Final    Comment: (  NOTE) SARS-CoV-2 target nucleic acids are NOT DETECTED.  The SARS-CoV-2 RNA is generally detectable in upper and lower respiratory specimens during  the acute phase of infection. Negative results do not preclude SARS-CoV-2 infection, do not rule out co-infections with other pathogens, and should not be used as the sole basis for treatment or other patient management decisions. Negative results must be combined with clinical observations, patient history, and epidemiological information. The expected result is Negative.  Fact Sheet for Patients: SugarRoll.be  Fact Sheet for Healthcare Providers: https://www.woods-mathews.com/  This test is not yet approved or cleared by the [place] FDA and  has been authorized for detection and/or diagnosis of SARS-CoV-2 by FDA under an Emergency Use Authorization (EUA). This EUA will remain  in effect (meaning this test can be used) for the duration of the COVID-19 declaration under Se ction 564(b)(1) of the Act, 21 U.S.C. section 360bbb-3(b)(1), unless the authorization is terminated or revoked sooner.  Performed at [facility], [place] [address], [place], [place] [address]     Lab Basic Metabolic Panel: Recent Labs  Lab 12/12/2020 1211 12/30/20 0447  NA 136 135  K 4.0 3.5  CL 102 103  CO2 20* 21*  GLUCOSE 93 104*  BUN 21 20  CREATININE 1.47* 1.30*  CALCIUM 9.4 8.7*   Liver Function Tests: Recent Labs  Lab 12/23/2020 1211 2020-12-30 0447  AST 27 20  ALT 12 10  ALKPHOS 70 67  BILITOT 1.3* 1.4*  PROT 7.3 6.3*  ALBUMIN 3.0* 2.6*   Recent Labs  Lab 12/09/2020 1211  LIPASE 107*   No results for input(s): AMMONIA in the last 168 hours. CBC: Recent Labs  Lab 12/10/2020 1211 December 30, 2020 0447 12/30/2020 1411  WBC 6.1 5.7 8.4  NEUTROABS 4.1  --   --   HGB 9.9* 8.1* 8.9*  HCT 32.7* 25.9* 28.9*  MCV 78.4* 76.6* 78.3*  PLT 359 288 314   Cardiac Enzymes: No results for input(s): CKTOTAL, CKMB, CKMBINDEX, TROPONINI in the last 168 hours. Sepsis Labs: Recent Labs  Lab 11/28/2020 1211 12-30-2020 0447 12/30/2020 1411  WBC 6.1 5.7 8.4     Procedures/Operations     [facility] 2020-12-30, 7:31 PM

## 2020-12-27 NOTE — Anesthesia Procedure Notes (Signed)
Procedure Name: MAC Date/Time: 12-07-20 11:01 AM Performed by: Imagene Riches, CRNA Pre-anesthesia Checklist: Patient identified, Emergency Drugs available, Suction available, Patient being monitored and Timeout performed Oxygen Delivery Method: Nasal cannula

## 2020-12-27 NOTE — Progress Notes (Signed)
Patient has a history of dementia, but is alert and oriented x 4 this morning. Understands procedure of upper endoscopy as explained. No questions or concerns voiced.

## 2020-12-27 NOTE — ED Notes (Addendum)
Pt refused to have morning labs done. Stated, " Just leave me alone." Pt would not cooperate with blood draw. Pt also refuses  for brief to be changed at this time. Melissa, RN present with this RN at bedside.

## 2020-12-27 NOTE — Op Note (Addendum)
[facility] Patient Name: Edward Stafford Procedure Date : December 12, 2020 MRN: [id number] Attending MD: Jerene Bears , MD Date of Birth: May 26, 1951 CSN: [id number] Age: 70 Admit Type: Inpatient Procedure:                Upper GI endoscopy Indications:              Microcytic anemia likely secondary to chronic blood                            loss, Heme positive stool, Suspected tumor of the                            pancreas as evidenced by abnormal CT of the GI tract Providers:                Lajuan Lines. Hilarie Fredrickson, MD, Doristine Johns, RN, Tyrone Apple, Technician, Imagene Riches, CRNA Referring MD:             [facility] Medicines:                Monitored Anesthesia Care Complications:            No immediate complications. Estimated Blood Loss:     Estimated blood loss was minimal. Procedure:                Pre-Anesthesia Assessment:                           - Prior to the procedure, a History and Physical                            was performed, and patient medications and                            allergies were reviewed. The patient's tolerance of                            previous anesthesia was also reviewed. The risks                            and benefits of the procedure and the sedation                            options and risks were discussed with the patient.                            All questions were answered, and informed consent                            was obtained. Prior Anticoagulants: The patient has                            taken no previous anticoagulant or antiplatelet  agents. ASA Grade Assessment: III - A patient with                            severe systemic disease. After reviewing the risks                            and benefits, the patient was deemed in                            satisfactory condition to undergo the procedure.                           After  obtaining informed consent, the endoscope was                            passed under direct vision. Throughout the                            procedure, the patient's blood pressure, pulse, and                            oxygen saturations were monitored continuously. The                            [id number] Olympus gastroscope was                            introduced through the mouth, and advanced to the                            second part of duodenum. The upper GI endoscopy was                            accomplished without difficulty. The patient                            tolerated the procedure well. Scope In: Scope Out: Findings:      LA Grade B (one or more mucosal breaks greater than 5 mm, not extending       between the tops of two mucosal folds) esophagitis with no bleeding was       found 38 to 40 cm from the incisors.      A small amount of food (residue) was found in the gastric fundus and in       the gastric body. This was able to be mostly cleared with irrigation and       lavage.      A large, deep ulceration no bleeding was found in the gastric body. This       is most consistent with tumor erosion into the gastric body wall. The       edges of this ulceration are nodular and heaped up and appear abnormal.       Multiple biopsies were taken with a cold forceps for histology.      One non-bleeding cratered duodenal ulcer with no stigmata of bleeding  was found in the duodenal bulb. The lesion was 10 mm in largest       dimension. This ulcer does not appear overtly malignant, but biopsies       were taken with a cold forceps for histology.      The second portion of the duodenum was normal. Impression:               - LA Grade B reflux esophagitis with no bleeding.                           - A small amount of food (residue) in the stomach.                           - Likely malignant ulceration from tumor erosion in                            the  gastric body. Biopsied.                           - Non-bleeding duodenal bulb ulcer with no stigmata                            of bleeding. Biopsied.                           - Normal second portion of the duodenum. Moderate Sedation:      N/A Recommendation:           - Return patient to hospital ward for ongoing care.                           - Advance diet as tolerated (expect need for                            gastroparesis diet).                           - Continue present medications including BID PPI.                           - Await pathology results.                           - No NSAIDs or ASA.                           - Oncology consult.                           - If gastric biopsies are nondiagnostic then IR                            consult is recommended for consideration of                            percutaneous CT guided biopsy.                           -  Monitor Hgb, replace iron IV while here. Procedure Code(s):        --- Professional ---                           [phone], Esophagogastroduodenoscopy, flexible,                            transoral; with biopsy, single or multiple Diagnosis Code(s):        --- Professional ---                           K21.00, Gastro-esophageal reflux disease with                            esophagitis, without bleeding                           D49.0, Neoplasm of unspecified behavior of                            digestive system                           K26.9, Duodenal ulcer, unspecified as acute or                            chronic, without hemorrhage or perforation                           D50.0, Iron deficiency anemia secondary to blood                            loss (chronic)                           R19.5, Other fecal abnormalities                           R93.3, Abnormal findings on diagnostic imaging of                            other parts of digestive tract CPT copyright 2019 [employer].  All rights reserved. The codes documented in this report are preliminary and upon coder review may  be revised to meet current compliance requirements. Jerene Bears, MD 12/20/20 11:41:01 AM This report has been signed electronically. Number of Addenda: 0

## 2020-12-27 NOTE — Progress Notes (Addendum)
PROGRESS NOTE   Edward Stafford  [id number]    DOB: 09-19-1951    DOA: 12/19/2020  PCP: Colon Branch, MD   I have briefly reviewed patients previous medical records in [facility].  Chief Complaint  Patient presents with  . Weight Loss    Brief Narrative:  70 year old male with history of hypertension, hyperlipidemia, CKD stage IIIa, GERD, prostate cancer s/p radioactive seed placement, hospitalized in July 2021 following a fall, confusion and shuffling gait at which time he was diagnosed with symptomatic B12 deficiency which improved after B12 shots, significant family history of pancreatic cancer in multiple cousins per report, presented to the ED with several weeks history of profound weight loss (50-60 pounds), reduced appetite, nausea with dry heaves.  Imaging revealed large pancreatic mass with suspected liver metastasis.  GI was consulted and s/p EGD 1/5 which shows tumor eroding through stomach, biopsied.  I consulted oncology 5.  Assessment & Plan:  Principal Problem:   Pancreatic mass Active Problems:   AKI (acute kidney injury) ([place])   Failure to thrive in adult   GI bleed   Acute blood loss anemia   Hyperbilirubinemia   Liver metastasis (HCC)   Hypoalbuminemia   Gastric ulcer   Duodenal ulcer   Large pancreatic mass, suspected pancreatic cancer with liver mets:  CT abdomen and pelvis without contrast 1/4: 24 x 20 cm lobulated heterogeneous mass appears to be arising from the pancreatic body and tail, highly concerning for malignancy.  Low-density areas are noted concerning for possible necrosis.  CT with IV contrast recommended.  18 mm rounded low-density right hepatic lobe consistent with metastatic disease.  [facility] consulted, s/p EGD 1/5 which shows tumor eroding into the stomach and was biopsied, follow-up on pathologies.  Communicated with Dr. Hilarie Fredrickson.  I consulted Dr. Alen Blew, oncology 1/5.  I called Dr. Earleen Newport, IR and canceled CT-guided biopsy for  now.  Will wait on formal oncology input prior to considering palliative care consultation.  Dr. Hilarie Fredrickson, GI updated patient's sister and patient's nephew who is a radiologist in [place] on 1/5 with radiographic and endoscopic findings and they were made aware that this is likely metastatic malignancy  Anemia secondary to slow GI blood loss from pancreatic cancer eroding into the stomach/iron deficiency anemia:  Hemoglobin 12 on 7/29.    Presented with hemoglobin of 9.9 which is dropped to 8.1 in the absence of overt bleeding, possibly due to hemodilution from IV fluids.  Anemia panel: Iron 17, TIBC 323, saturation ratio 5, ferritin 35 and B12: 633.  FOBT +.  Follow CBC daily and transfuse if hemoglobin 7 g or less.  Aspirin held.  Continue PPI  Agree with IV iron.  Acute kidney injury complicating stage III a CKD  Presented with creatinine of 1.47 which has improved to 1.3.  AKI seems to have resolved.  His creatinine seems to be at baseline.  Continue gentle IVF given poor oral intake  Holding losartan.  Essential hypertension  Reasonably controlled.  Holding losartan.  Consider as needed hydralazine if blood pressures consistently up.  Hyperlipidemia  Continue statins.  Suspect severe malnutrition/failure to thrive  Secondary to suspected metastatic pancreatic cancer.  Consulted [employer].  Dementia  Without behavioral abnormalities currently.  Appears to have capacity to make medical decisions for himself.  Continue prior home dose of Seroquel.  History of prostate cancer s/p radiation seed placement 10/2018.  History of polysubstance abuse  Prior history of cocaine use back in 2013.  Denies current use.  History of B12 deficiency  B12 normal now.  Body mass index is 25.41 kg/m.  Nutritional Status         DVT prophylaxis: SCDs Start: 12/11/2020 1550     Code Status: Full Code Family Communication: None at  bedside Disposition:  Status is: Inpatient  Remains inpatient appropriate because:Inpatient level of care appropriate due to severity of illness   Dispo: The patient is from: Home              Anticipated d/c is to: TBD              Anticipated d/c date is: > 3 days              Patient currently is not medically stable to d/c.        Consultants:   [facility]  Procedures:   EGD 2020/12/30:  Impression:  LA Grade B reflux esophagitis with no bleeding. - A small amount of food (residue) in the stomach. - Likely malignant ulceration from tumor erosion in the gastric body. Biopsied. - Non-bleeding duodenal bulb ulcer with no stigmata of bleeding. Biopsied. - Normal second portion of the duodenum.  Recommendation:  - Return patient to hospital ward for ongoing care. - Advance diet as tolerated (expect need for gastroparesis diet). - Continue present medications including BID PPI. - Await pathology results. - No NSAIDs or ASA. - Oncology consult. - If gastric biopsies are nondiagnostic then IR consult is recommended for consideration of percutaneous CT guided biopsy. - Monitor Hgb, replace iron IV while here.  Antimicrobials:    Anti-infectives (From admission, onward)   None        Subjective:  Patient seen this morning prior to procedure.  Denied complaints.  Wanted to know when he was having the procedure.  No pain reported.  No BM.  As per RN, no acute issues.  Objective:   Vitals:   2020/12/30 1036 2020/12/30 1124 12-30-2020 1132 December 30, 2020 1142  BP: (!) 168/97 105/61 102/60 (!) 143/79  Pulse:  63 60 60  Resp:  (!) 21 20 20   Temp:  97.9 F (36.6 C)    TempSrc:  Oral    SpO2:  100% 100% 100%  Weight:      Height:        General exam: Pleasant middle-age male, moderately built and poorly nourished lying comfortably propped up in bed without distress. Respiratory system: Clear to auscultation. Respiratory effort normal. Cardiovascular  system: S1 & S2 heard, RRR. No JVD, murmurs, rubs, gallops or clicks. No pedal edema.  Telemetry personally reviewed: SB in the high 50s-SR in the 60s. Gastrointestinal system: Abdomen is distended mostly in the left quadrants with easily palpable huge mass, irregular, nontender and nonpulsatile.  No other masses or organomegaly appreciated.  Soft and nontender.  Normal bowel sounds heard. Central nervous system: Alert and oriented x2. No focal neurological deficits. Extremities: Symmetric 5 x 5 power. Skin: No rashes, lesions or ulcers Psychiatry: Judgement and insight appear somewhat impaired. Mood & affect flat.     Data Reviewed:   I have personally reviewed following labs and imaging studies   CBC: Recent Labs  Lab 12/01/2020 1211 December 30, 2020 0447  WBC 6.1 5.7  NEUTROABS 4.1  --   HGB 9.9* 8.1*  HCT 32.7* 25.9*  MCV 78.4* 76.6*  PLT 359 288    Basic Metabolic Panel: Recent Labs  Lab 12/25/2020 1211 December 30, 2020 0447  NA 136 135  K  4.0 3.5  CL 102 103  CO2 20* 21*  GLUCOSE 93 104*  BUN 21 20  CREATININE 1.47* 1.30*  CALCIUM 9.4 8.7*    Liver Function Tests: Recent Labs  Lab 12/13/2020 1211 2020-12-08 0447  AST 27 20  ALT 12 10  ALKPHOS 70 67  BILITOT 1.3* 1.4*  PROT 7.3 6.3*  ALBUMIN 3.0* 2.6*    CBG: No results for input(s): GLUCAP in the last 168 hours.  Microbiology Studies:   Recent Results (from the past 240 hour(s))  SARS CORONAVIRUS 2 (TAT 6-24 HRS) Nasopharyngeal Nasopharyngeal Swab     Status: None   Collection Time: 12/03/2020  3:49 PM   Specimen: Nasopharyngeal Swab  Result Value Ref Range Status   SARS Coronavirus 2 NEGATIVE NEGATIVE Final    Comment: (NOTE) SARS-CoV-2 target nucleic acids are NOT DETECTED.  The SARS-CoV-2 RNA is generally detectable in upper and lower respiratory specimens during the acute phase of infection. Negative results do not preclude SARS-CoV-2 infection, do not rule out co-infections with other pathogens, and should not  be used as the sole basis for treatment or other patient management decisions. Negative results must be combined with clinical observations, patient history, and epidemiological information. The expected result is Negative.  Fact Sheet for Patients: SugarRoll.be  Fact Sheet for Healthcare Providers: https://www.woods-mathews.com/  This test is not yet approved or cleared by the [place] FDA and  has been authorized for detection and/or diagnosis of SARS-CoV-2 by FDA under an Emergency Use Authorization (EUA). This EUA will remain  in effect (meaning this test can be used) for the duration of the COVID-19 declaration under Se ction 564(b)(1) of the Act, 21 U.S.C. section 360bbb-3(b)(1), unless the authorization is terminated or revoked sooner.  Performed at [facility], [place] [address], [place], [place] [address]      Radiology Studies:  CT ABDOMEN PELVIS WO CONTRAST  Result Date: 11/28/2020 CLINICAL DATA:  Weight loss, abdominal distention. EXAM: CT ABDOMEN AND PELVIS WITHOUT CONTRAST TECHNIQUE: Multidetector CT imaging of the abdomen and pelvis was performed following the standard protocol without IV contrast. COMPARISON:  None. FINDINGS: Lower chest: No acute abnormality. Hepatobiliary: Status post cholecystectomy. No biliary dilatation is noted. 18 mm rounded low density is noted in right hepatic lobe consistent with metastatic disease. Pancreas: 24 x 20 cm lobulated heterogeneous mass appears to be arising from the pancreatic body and tail highly concerning for malignancy. Low density areas are noted concerning for possible necrosis. No ductal dilatation is noted. Spleen: Normal in size without focal abnormality. Adrenals/Urinary Tract: Adrenal glands are unremarkable. Kidneys are normal, without renal calculi, focal lesion, or hydronephrosis. Bladder is unremarkable. Stomach/Bowel: The stomach is displaced anteriorly by the probable  large pancreatic mass. There does not appear to be any definite evidence of bowel obstruction or ileus. The appendix appears normal. Vascular/Lymphatic: Aortic atherosclerosis. No enlarged abdominal or pelvic lymph nodes. Reproductive: Status post prostatic brachytherapy seed placement. Other: No abdominal wall hernia or abnormality. No abdominopelvic ascites. Musculoskeletal: No acute or significant osseous findings. IMPRESSION: 1. 24 x 20 cm lobulated heterogeneous mass appears to be arising from the pancreatic body and tail highly concerning for malignancy. Low density areas are noted concerning for possible necrosis. CT scan with intravenous contrast is recommended for further evaluation. 2. 18 mm rounded low density is noted in right hepatic lobe consistent with metastatic disease. 3. Status post prostatic brachytherapy seed placement. 4. Aortic atherosclerosis. Aortic Atherosclerosis (ICD10-I70.0). Electronically Signed   By: Jeneen Rinks  Murlean Caller M.D.   On: 12/09/2020 13:33     Scheduled Meds:   . feeding supplement  237 mL Oral TID BM  . [START ON 12/01/2020] pantoprazole  40 mg Oral BID AC  . pantoprazole (PROTONIX) IV  40 mg Intravenous Q12H  . pravastatin  20 mg Oral Daily  . QUEtiapine  25 mg Oral QHS  . sodium chloride flush  3 mL Intravenous Q12H    Continuous Infusions:   . sodium chloride 75 mL/hr at 2020/12/08 1212  . iron dextran (INFED/DEXFERRUM) infusion     Followed by  . iron dextran (INFED/DEXFERRUM) infusion       LOS: 1 day     Addendum  I received page at 1:29 PM indicating that patient was being coded.  I promptly arrived to patient's bedside where the entire code team led by the IMTS medical resident were working on the patient.  Although patient briefly had ROSC, this was not sustained despite several attempts at resuscitation.  I did discuss with PCCM who came to the bedside as well.  I updated patient's sister and a nephew while cord was in progress, unfortunately  there was no clear designated healthcare power of attorney known at that time.  I advised them of his extremely poor prognosis.  His resuscitation was eventually unsuccessful and efforts were discontinued and patient was pronounced dead.  I called and updated patient's sister again, offered condolences and comfort.  She was appreciative of the call.    Vernell Leep, MD, [place], [facility]. [facility]    To contact the attending provider between 7A-7P or the covering provider during after hours 7P-7A, please log into the web site [email or web address] and access using universal [place] password for that web site. If you do not have the password, please call the hospital operator.  2020-12-08, 12:21 PM

## 2020-12-27 NOTE — Code Documentation (Signed)
  Patient Name: Edward Stafford   MRN: [id number]   Date of Birth/ Sex: Nov 24, 1951 , male      Admission Date: December 27, 2020  Attending Provider: Elease Etienne, MD  Primary Diagnosis: Pancreatic mass [K86.89] Failure to thrive in adult [R62.7]   Indication: Pt was in his usual state of health until this 1:23 PM, when he was noted to be in PEA. Code blue was subsequently called. At the time of arrival on scene, ACLS protocol was underway.   Technical Description:  - CPR performance duration:  4 times, 2 minutes each   - Was defibrillation or cardioversion used? No   - Was external pacer placed? No  - Was patient intubated pre/post CPR? Yes   Medications Administered: Y = Yes; Blank = No Amiodarone    Atropine    Calcium    Epinephrine  Y  Lidocaine    Magnesium    Norepinephrine  Y  Phenylephrine    Sodium bicarbonate    Vasopressin    Other    Post CPR evaluation:  - Final Status - Was patient successfully resuscitated ? No   Miscellaneous Information:  - Time of death:  2:03 PM  - Primary team notified?  Yes  - Family Notified? Yes     Jovita Kussmaul, MD   12/13/2020, 2:16 PM

## 2020-12-27 NOTE — Progress Notes (Signed)
  I learned of patient's PEA arrest and death recently.  I went by the room to see if family was present.  There were 4 members of the family present along with the chaplain.  His sister, Geri Seminole, with whom I spoke after his upper endoscopy was present along with 3 other male members of the family.  I let them know that I was sorry for their loss.  They had questions regarding his likely malignancy and the concern that multiple members of their family have had pancreatic cancer.  I recommended that they discuss this family history with their primary care provider or a gastroenterologist to determine if medical genetics or cross-sectional imaging for screening would be indicated.  We also have pending biopsies from the stomach which is presumed to be tumor eroding into the gastric wall.  Once I have these biopsy results I will call [name] by phone.  Time provided for questions and answers.  They thanked me for my visit.  They seem to be at peace and expressed that he is with God.

## 2020-12-27 NOTE — Transfer of Care (Signed)
Immediate Anesthesia Transfer of Care Note  Patient: Jasdeep L Fulford  Procedure(s) Performed: ESOPHAGOGASTRODUODENOSCOPY (EGD) WITH PROPOFOL (N/A ) BIOPSY  Patient Location: Endoscopy Unit  Anesthesia Type:General  Level of Consciousness: drowsy  Airway & Oxygen Therapy: Patient Spontanous Breathing and Patient connected to nasal cannula oxygen  Post-op Assessment: Report given to RN and Post -op Vital signs reviewed and stable  Post vital signs: Reviewed and stable  Last Vitals:  Vitals Value Taken Time  BP 105/61 2020-12-30 1124  Temp 36.6 C 30-Dec-2020 1124  Pulse 63 2020-12-30 1126  Resp 21 12/30/20 1126  SpO2 100 % December 30, 2020 1126  Vitals shown include unvalidated device data.  Last Pain:  Vitals:   12-30-20 1124  TempSrc: Oral  PainSc:          Complications: No complications documented.

## 2020-12-27 NOTE — Anesthesia Procedure Notes (Signed)
Procedure Name: Intubation Date/Time: 2020-12-10 1:36 PM Performed by: Lavell Luster, CRNA Pre-anesthesia Checklist: Patient identified, Emergency Drugs available, Suction available, Patient being monitored and Timeout performed Patient Re-evaluated:Patient Re-evaluated prior to induction Oxygen Delivery Method: Ambu bag Preoxygenation: Pre-oxygenation with 100% oxygen Induction Type: IV induction, Cricoid Pressure applied and Rapid sequence Laryngoscope Size: Mac, 4 and Glidescope Grade View: Grade I Tube type: Oral Tube size: 7.5 mm Number of attempts: 1 Airway Equipment and Method: Stylet and Video-laryngoscopy Placement Confirmation: ETT inserted through vocal cords under direct vision,  positive ETCO2 and breath sounds checked- equal and bilateral Secured at: 23 cm Tube secured with: Tape Dental Injury: Teeth and Oropharynx as per pre-operative assessment

## 2020-12-27 NOTE — ED Notes (Signed)
Report given to floor Vernell Barrier, RN.

## 2020-12-27 NOTE — Progress Notes (Signed)
MEDICATION RELATED CONSULT NOTE - INITIAL   Pharmacy Consult for IV iron Indication: anemia  No Known Allergies  Patient Measurements: Height: 6' (182.9 cm) Weight: 85 kg (187 lb 6.3 oz) IBW/kg (Calculated) : 77.6  Vital Signs: Temp: 97.9 F (36.6 C) (01/05 1124) Temp Source: Oral (01/05 1124) BP: 143/79 (01/05 1142) Pulse Rate: 60 (01/05 1142) Intake/Output from previous day: No intake/output data recorded. Intake/Output from this shift: Total I/O In: 300 [I.V.:300] Out: -   Labs: Recent Labs    12/04/2020 1211 2020/12/01 0447  WBC 6.1 5.7  HGB 9.9* 8.1*  HCT 32.7* 25.9*  PLT 359 288  CREATININE 1.47* 1.30*  ALBUMIN 3.0* 2.6*  PROT 7.3 6.3*  AST 27 20  ALT 12 10  ALKPHOS 70 67  BILITOT 1.3* 1.4*   Estimated Creatinine Clearance: 59.7 mL/min (A) (by C-G formula based on SCr of 1.3 mg/dL (H)).   Microbiology: Recent Results (from the past 720 hour(s))  SARS CORONAVIRUS 2 (TAT 6-24 HRS) Nasopharyngeal Nasopharyngeal Swab     Status: None   Collection Time: 11/26/2020  3:49 PM   Specimen: Nasopharyngeal Swab  Result Value Ref Range Status   SARS Coronavirus 2 NEGATIVE NEGATIVE Final    Comment: (NOTE) SARS-CoV-2 target nucleic acids are NOT DETECTED.  The SARS-CoV-2 RNA is generally detectable in upper and lower respiratory specimens during the acute phase of infection. Negative results do not preclude SARS-CoV-2 infection, do not rule out co-infections with other pathogens, and should not be used as the sole basis for treatment or other patient management decisions. Negative results must be combined with clinical observations, patient history, and epidemiological information. The expected result is Negative.  Fact Sheet for Patients: HairSlick.no  Fact Sheet for Healthcare Providers: quierodirigir.com  This test is not yet approved or cleared by the [place] FDA and  has been authorized for  detection and/or diagnosis of SARS-CoV-2 by FDA under an Emergency Use Authorization (EUA). This EUA will remain  in effect (meaning this test can be used) for the duration of the COVID-19 declaration under Se ction 564(b)(1) of the Act, 21 U.S.C. section 360bbb-3(b)(1), unless the authorization is terminated or revoked sooner.  Performed at [facility], [address], [place], [place] [address]     Medical History: Past Medical History:  Diagnosis Date  . Back pain, chronic    disabled due to chronic back pain  . Chest pain    Cardiac cath (-)2008, Myoview (-) 2011  . Chronic kidney disease   . Drug abuse-- crack, marijuana 04/19/2007   hx of  . GERD (gastroesophageal reflux disease)    no current meds  . Glaucoma   . Hyperglycemia   . Hyperlipidemia   . Hypertension   . Prostate cancer (HCC) 06/2018  . SINUS BRADYCARDIA 05/17/2010  . UTI (urinary tract infection)    on doxycycline for    Assessment: 70 year old male s/p EGD this morning for heme positive stool and microcytic anemia. Also concern for pancreatic malignancy. Pharmacy ordered to give IV iron for a target hgb of 14. Calculated deficit of ~1g.   Will give iron dextran 25mg  test dose, if tolerates will give the remaining 1000mg  tonight.   PharmD., BCPS Clinical Pharmacist 12/01/2020 2:15 PM

## 2020-12-27 NOTE — Interval H&P Note (Signed)
History and Physical Interval Note: For EGD this morning to evaluate heme positive stool and microcytic anemia.  Concern for large pancreatic malignancy and we will rule out overt invasion into the upper GI tract today. If nondiagnostic will need IR for percutaneous biopsy versus EUS. The nature of the procedure, as well as the risks, benefits, and alternatives were carefully and thoroughly reviewed with the patient. Ample time for discussion and questions allowed. The patient understood, was satisfied, and agreed to proceed.   CBC Latest Ref Rng & Units 2020-12-07 12/14/2020 06/23/2020  WBC 4.0 - 10.5 K/uL 5.7 6.1 3.5(L)  Hemoglobin 13.0 - 17.0 g/dL 8.1(L) 9.9(L) 12.0(L)  Hematocrit 39.0 - 52.0 % 25.9(L) 32.7(L) 36.7(L)  Platelets 150 - 400 K/uL 288 359 230     12/07/20 10:35 AM  [name]  has presented today for surgery, with the diagnosis of wt loss, early satiety, mass in pancreas and mets in liver.  FOBT + stool and microcytic anemia.  The various methods of treatment have been discussed with the patient and family. After consideration of risks, benefits and other options for treatment, the patient has consented to  Procedure(s): ESOPHAGOGASTRODUODENOSCOPY (EGD) WITH PROPOFOL (N/A) as a surgical intervention.  The patient's history has been reviewed, patient examined, no change in status, stable for surgery.  I have reviewed the patient's chart and labs.  Questions were answered to the patient's satisfaction.     Edward Stafford

## 2020-12-27 NOTE — Procedures (Addendum)
I spoke to the patient, the patient's sister Geri Seminole, and the patient's nephew Dr. Fannie Knee who is a radiologist in [place]. I discussed the radiographic, endoscopic findings with him by phone They are both aware that this is likely a large metastatic malignancy Time provided for questions and answers and they thanked me for the call

## 2020-12-27 NOTE — Progress Notes (Signed)
   12/14/2020 1325  Clinical Encounter Type  Visited With Health care provider [facility] Ron)  Visit Type Code  Referral From  (Code Blue Page)  Consult/Referral To Chaplain  Chaplain responded to Code Blue for Mr. Weinreb.  When I arrived medical team was working nonstop to revive Mr. Yom.  He coded a few times on the last set of compressions Mr. Bohr could not be revived and came to End of Life.  [facility]  and MD [facility] called the family to inform of the code and the loss of their love one.   The Medical Team did all they could to revive.  Family was currently on their way at my time of departure. Inform Ron the [facility] when family arrives and if Chaplain is needed please page.    Chaplain Angelis Gates Morgan-Simpson  [phone]

## 2020-12-27 NOTE — ED Notes (Signed)
Pt cleaned of urinary incontinence. New brief applied.  Linens changed. Pt assisted to recliner.

## 2020-12-27 NOTE — Anesthesia Postprocedure Evaluation (Signed)
Anesthesia Post Note  Patient: Kobey L Kuhar  Procedure(s) Performed: ESOPHAGOGASTRODUODENOSCOPY (EGD) WITH PROPOFOL (N/A ) BIOPSY     Patient location during evaluation: Endoscopy Anesthesia Type: MAC Level of consciousness: awake and alert Pain management: pain level controlled Vital Signs Assessment: post-procedure vital signs reviewed and stable Respiratory status: spontaneous breathing, nonlabored ventilation and respiratory function stable Cardiovascular status: blood pressure returned to baseline and stable Postop Assessment: no apparent nausea or vomiting Anesthetic complications: no   No complications documented.  Last Vitals:  Vitals:   12/18/20 1132 12/18/20 1142  BP: 102/60 (!) 143/79  Pulse: 60 60  Resp: 20 20  Temp:    SpO2: 100% 100%    Last Pain:  Vitals:   December 18, 2020 1124  TempSrc: Oral  PainSc:                  Merlinda Frederick

## 2020-12-27 DEATH — deceased

## 2021-11-05 IMAGING — CT CT HEAD W/O CM
3 series · 14 of 47 positions shown, 16 images · non-contrast
Comparison: 04/17/2012

CLINICAL DATA: Weakness status post fall.

EXAM:
CT HEAD WITHOUT CONTRAST
TECHNIQUE: Contiguous axial images were obtained from the base of the skull
through the vertex without intravenous contrast.

[Series 2: head wo · axial · 0.53mm/px · z∈[-126,+9]mm · 8 of 33 slices shown, 10 images]
[im 3/33  brain]
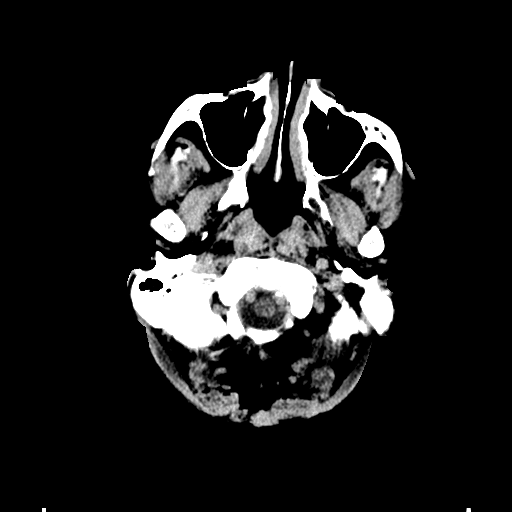
[im 3/33  bone]
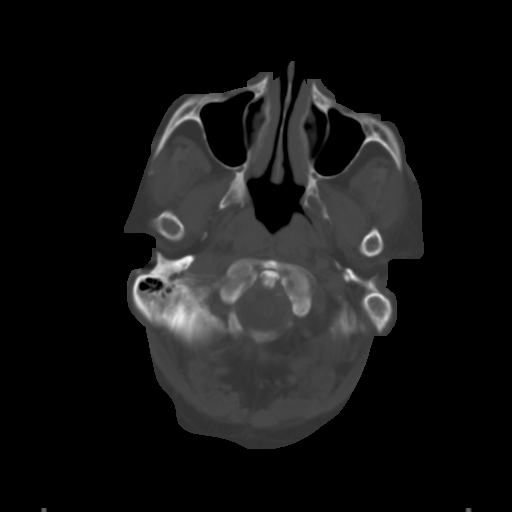
[im 7/33  brain]
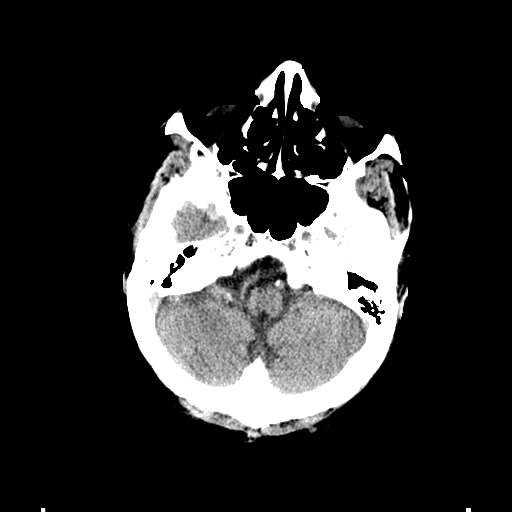
[im 10/33  brain]
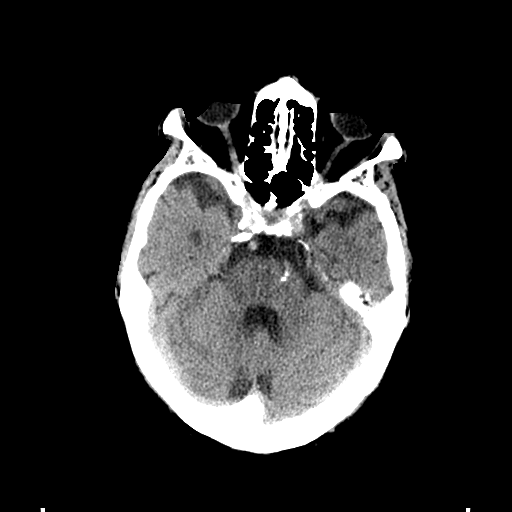
[im 15/33  brain]
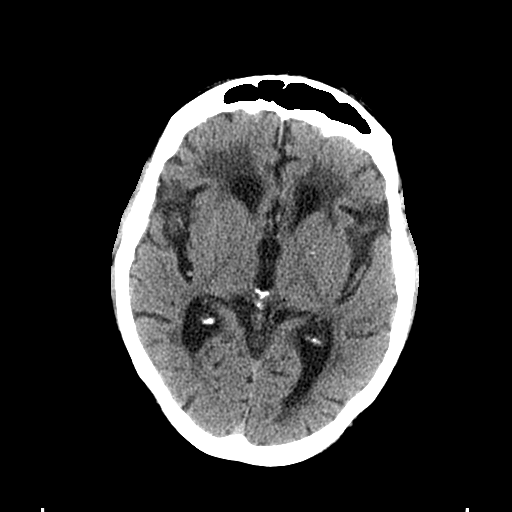
[im 18/33  brain]
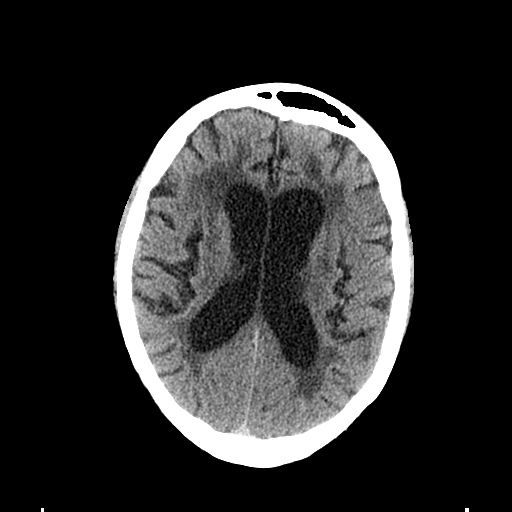
[im 18/33  bone]
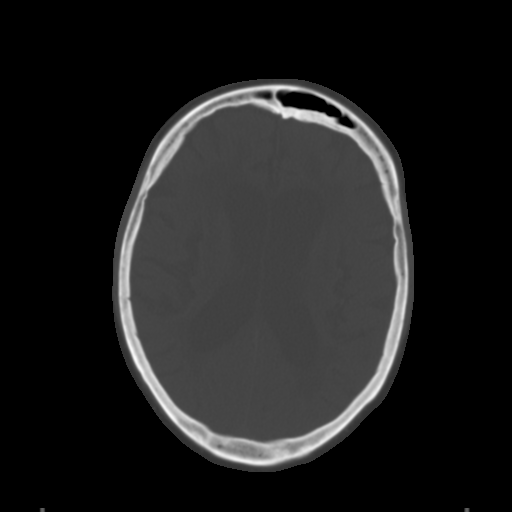
[im 23/33  brain]
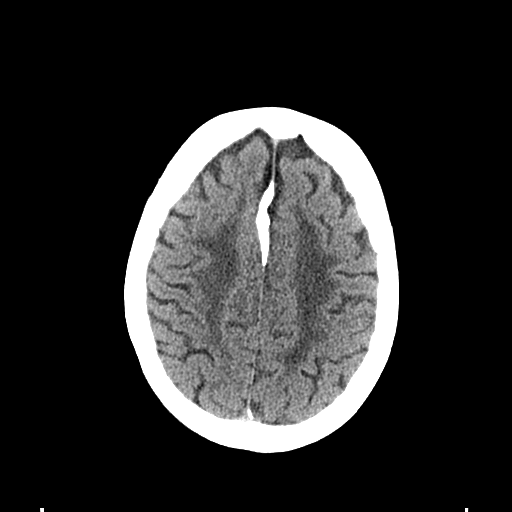
[im 26/33  brain]
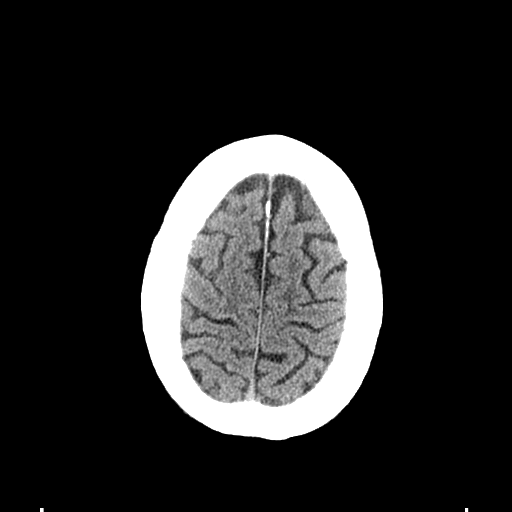
[im 30/33  brain]
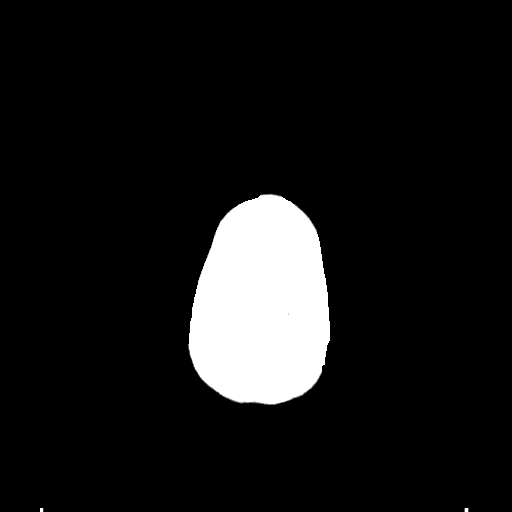

[Series 4: coronal soft tissue · coronal · 0.35mm/px · 3 of 70 slices shown]
[im 24/70  brain]
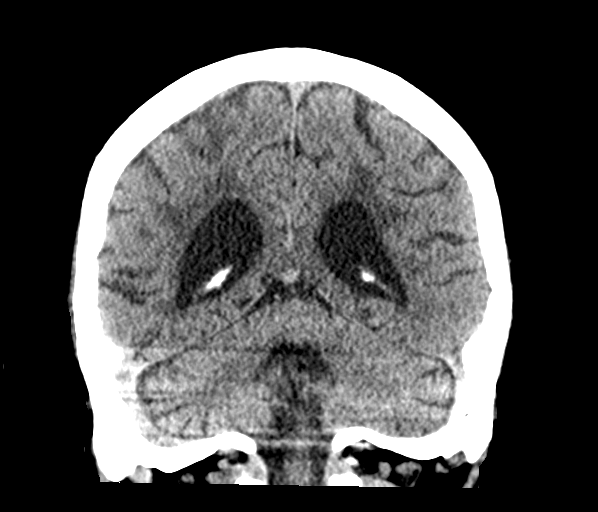
[im 31/70  brain]
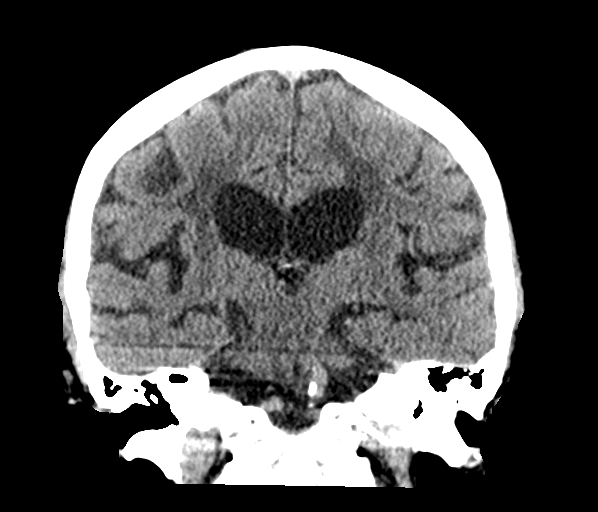
[im 39/70  brain]
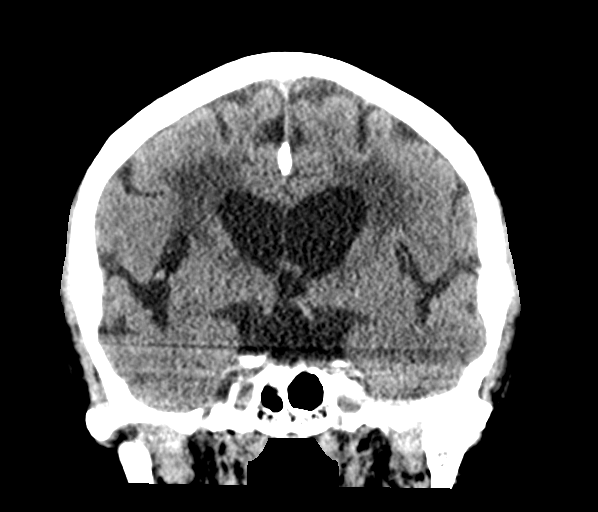

[Series 5: sagittal soft tissue · sagittal · 0.32mm/px · 3 of 56 slices shown]
[im 19/56  brain]
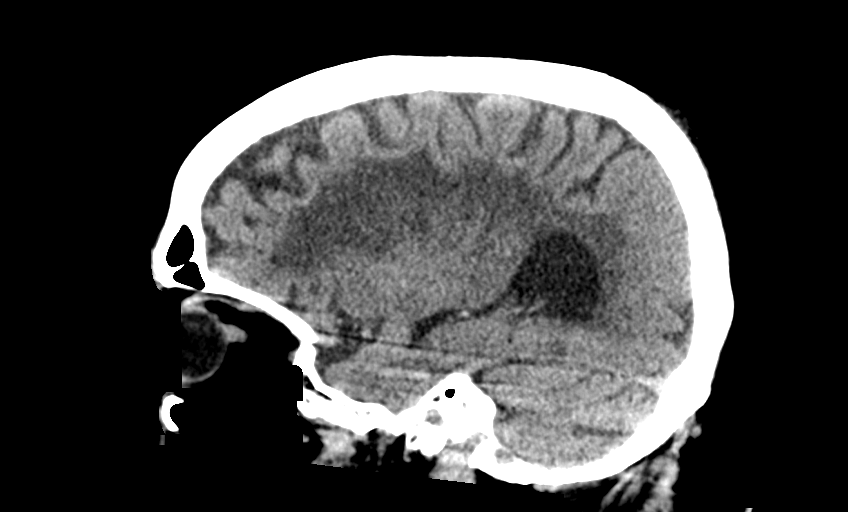
[im 28/56  brain]
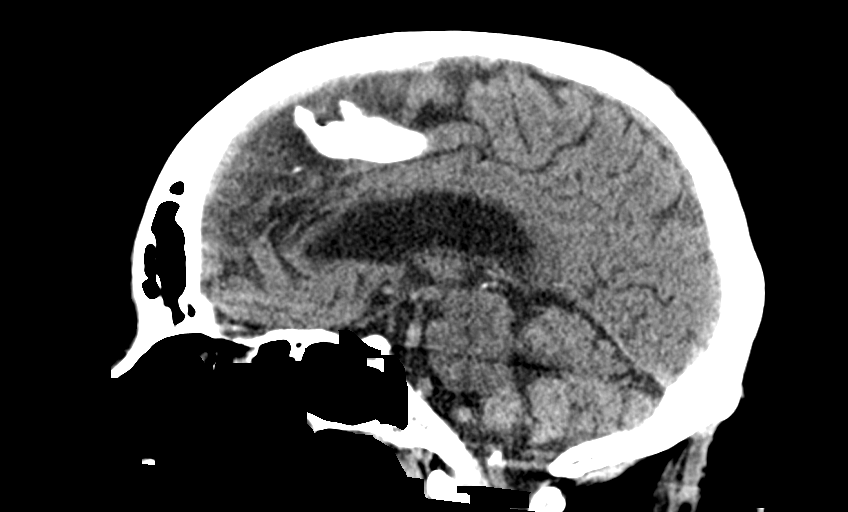
[im 37/56  brain]
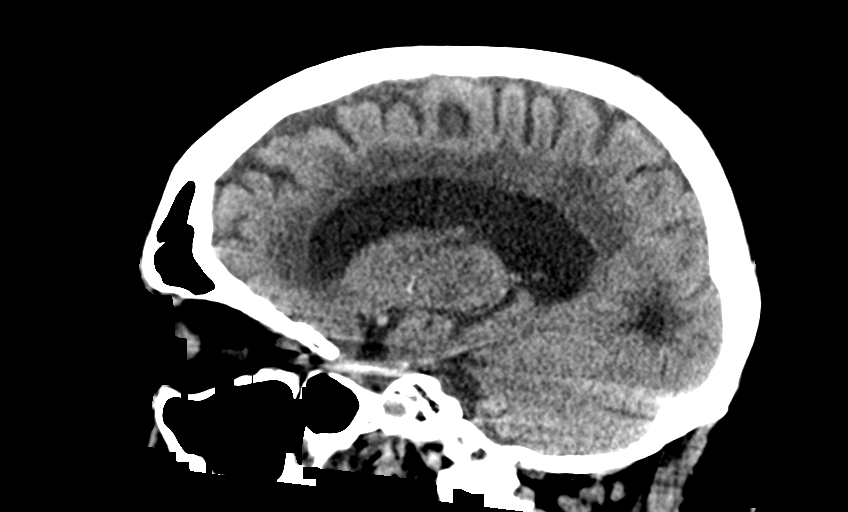

[14 of 47 positions shown; findings below may reference images not displayed]

FINDINGS: Brain: There is no evidence of acute infarct, intracranial
hemorrhage, mass, midline shift, or extra-axial fluid collection.
Confluent hypodensities in the cerebral white matter bilaterally are
nonspecific but compatible with severe chronic small vessel ischemic
disease, stable to slightly progressed from the prior CT. There is a
chronic lacunar infarct in the right thalamus.

Vascular: Calcified atherosclerosis at the skull base. No hyperdense
vessel.

Skull: No fracture or suspicious osseous lesion.

Sinuses/Orbits: Visualized paranasal sinuses and mastoid air cells
are clear. Unremarkable orbits.

Other: None.
IMPRESSION: 1. No evidence of acute intracranial abnormality.
2. Severe chronic small vessel ischemic disease.
